# Patient Record
Sex: Female | Born: 1980 | Race: Black or African American | Hispanic: No | Marital: Married | Smoking: Never smoker
Health system: Southern US, Community
[De-identification: ages and names within clinical notes are randomized; demographics above are authoritative.]

## PROBLEM LIST (undated history)

## (undated) ENCOUNTER — Inpatient Hospital Stay (HOSPITAL_COMMUNITY): Payer: Self-pay

## (undated) DIAGNOSIS — D47Z2 Castleman disease: Secondary | ICD-10-CM

## (undated) DIAGNOSIS — H9192 Unspecified hearing loss, left ear: Secondary | ICD-10-CM

## (undated) DIAGNOSIS — O1490 Unspecified pre-eclampsia, unspecified trimester: Secondary | ICD-10-CM

## (undated) DIAGNOSIS — D499 Neoplasm of unspecified behavior of unspecified site: Secondary | ICD-10-CM

## (undated) HISTORY — DX: Unspecified hearing loss, left ear: H91.92

## (undated) HISTORY — PX: ADENOIDECTOMY: SUR15

## (undated) HISTORY — PX: LEEP: SHX91

## (undated) HISTORY — PX: OTHER SURGICAL HISTORY: SHX169

## (undated) HISTORY — DX: Unspecified pre-eclampsia, unspecified trimester: O14.90

## (undated) HISTORY — DX: Castleman disease: D47.Z2

---

## 2000-12-31 ENCOUNTER — Emergency Department (HOSPITAL_COMMUNITY): Admission: EM | Admit: 2000-12-31 | Discharge: 2000-12-31 | Payer: Self-pay | Admitting: Emergency Medicine

## 2004-05-17 ENCOUNTER — Ambulatory Visit (HOSPITAL_COMMUNITY): Admission: RE | Admit: 2004-05-17 | Discharge: 2004-05-17 | Payer: Self-pay | Admitting: Obstetrics and Gynecology

## 2005-08-13 ENCOUNTER — Ambulatory Visit (HOSPITAL_COMMUNITY): Admission: RE | Admit: 2005-08-13 | Discharge: 2005-08-13 | Payer: Self-pay | Admitting: Obstetrics

## 2005-10-05 ENCOUNTER — Ambulatory Visit (HOSPITAL_COMMUNITY): Admission: RE | Admit: 2005-10-05 | Discharge: 2005-10-05 | Payer: Self-pay | Admitting: Obstetrics

## 2005-11-19 ENCOUNTER — Inpatient Hospital Stay (HOSPITAL_COMMUNITY): Admission: AD | Admit: 2005-11-19 | Discharge: 2005-11-22 | Payer: Self-pay | Admitting: Obstetrics

## 2005-11-28 ENCOUNTER — Inpatient Hospital Stay (HOSPITAL_COMMUNITY): Admission: AD | Admit: 2005-11-28 | Discharge: 2005-11-30 | Payer: Self-pay | Admitting: Obstetrics & Gynecology

## 2009-06-16 ENCOUNTER — Ambulatory Visit: Payer: Self-pay | Admitting: Emergency Medicine

## 2009-06-16 ENCOUNTER — Ambulatory Visit: Payer: Self-pay | Admitting: Thoracic Surgery

## 2009-06-16 DIAGNOSIS — R930 Abnormal findings on diagnostic imaging of skull and head, not elsewhere classified: Secondary | ICD-10-CM | POA: Insufficient documentation

## 2009-06-21 ENCOUNTER — Ambulatory Visit (HOSPITAL_COMMUNITY): Admission: RE | Admit: 2009-06-21 | Discharge: 2009-06-21 | Payer: Self-pay | Admitting: Thoracic Surgery

## 2009-06-21 ENCOUNTER — Ambulatory Visit: Payer: Self-pay | Admitting: Thoracic Surgery

## 2009-06-21 ENCOUNTER — Ambulatory Visit: Payer: Self-pay | Admitting: Emergency Medicine

## 2009-06-22 ENCOUNTER — Ambulatory Visit: Payer: Self-pay | Admitting: Internal Medicine

## 2009-06-24 ENCOUNTER — Ambulatory Visit: Payer: Self-pay | Admitting: Thoracic Surgery

## 2009-07-11 LAB — LACTATE DEHYDROGENASE: LDH: 100 U/L (ref 94–250)

## 2009-07-11 LAB — CBC WITH DIFFERENTIAL/PLATELET
BASO%: 0.4 % (ref 0.0–2.0)
EOS%: 1.2 % (ref 0.0–7.0)
HGB: 13.8 g/dL (ref 11.6–15.9)
MCH: 29.8 pg (ref 25.1–34.0)
MCHC: 32.8 g/dL (ref 31.5–36.0)
MCV: 90.8 fL (ref 79.5–101.0)
MONO%: 5.5 % (ref 0.0–14.0)
RBC: 4.62 10*6/uL (ref 3.70–5.45)
RDW: 13.5 % (ref 11.2–14.5)
lymph#: 2.1 10*3/uL (ref 0.9–3.3)

## 2009-07-11 LAB — SEDIMENTATION RATE: Sed Rate: 7 mm/hr (ref 0–22)

## 2009-07-11 LAB — HIV ANTIBODY (ROUTINE TESTING W REFLEX)

## 2009-07-15 ENCOUNTER — Ambulatory Visit: Payer: Self-pay | Admitting: Thoracic Surgery

## 2009-08-05 ENCOUNTER — Ambulatory Visit: Payer: Self-pay | Admitting: Oncology

## 2009-08-12 ENCOUNTER — Ambulatory Visit (HOSPITAL_COMMUNITY): Admission: RE | Admit: 2009-08-12 | Discharge: 2009-08-12 | Payer: Self-pay | Admitting: Oncology

## 2009-08-31 ENCOUNTER — Ambulatory Visit: Payer: Self-pay | Admitting: Thoracic Surgery

## 2009-09-05 ENCOUNTER — Ambulatory Visit: Payer: Self-pay | Admitting: Oncology

## 2009-10-03 ENCOUNTER — Encounter: Payer: Self-pay | Admitting: Emergency Medicine

## 2009-11-03 ENCOUNTER — Ambulatory Visit: Payer: Self-pay | Admitting: Oncology

## 2009-11-07 ENCOUNTER — Ambulatory Visit (HOSPITAL_COMMUNITY): Admission: RE | Admit: 2009-11-07 | Discharge: 2009-11-07 | Payer: Self-pay | Admitting: Oncology

## 2009-11-07 LAB — CBC WITH DIFFERENTIAL/PLATELET
BASO%: 0.5 % (ref 0.0–2.0)
Basophils Absolute: 0 10*3/uL (ref 0.0–0.1)
HCT: 41.4 % (ref 34.8–46.6)
HGB: 13.9 g/dL (ref 11.6–15.9)
LYMPH%: 35.2 % (ref 14.0–49.7)
MCH: 30.2 pg (ref 25.1–34.0)
MCHC: 33.6 g/dL (ref 31.5–36.0)
MONO#: 0.3 10*3/uL (ref 0.1–0.9)
NEUT%: 57.4 % (ref 38.4–76.8)
Platelets: 288 10*3/uL (ref 145–400)
WBC: 6.1 10*3/uL (ref 3.9–10.3)

## 2009-11-07 LAB — BASIC METABOLIC PANEL
BUN: 16 mg/dL (ref 6–23)
CO2: 23 mEq/L (ref 19–32)
Calcium: 8.9 mg/dL (ref 8.4–10.5)
Creatinine, Ser: 0.93 mg/dL (ref 0.40–1.20)
Glucose, Bld: 85 mg/dL (ref 70–99)

## 2010-02-21 ENCOUNTER — Ambulatory Visit: Payer: Self-pay | Admitting: Oncology

## 2010-02-23 ENCOUNTER — Encounter: Payer: Self-pay | Admitting: Emergency Medicine

## 2010-05-16 NOTE — Assessment & Plan Note (Signed)
Summary: abnormal CT scan chest    Visit Type:  Initial Consult  CC:  abnormal CT scan.  History of Present Illness: 30 yo woman, little PMH, presented to [facility] with chest discomfort and tightness. CXR revealed R paratracheal mass. CT scan was performed that confirmed a 3.5 x 2.5 x 5.0cm R paratracheal without any other notable LAD. Presents to discuss tissue dx.   Current Medications (verified): 1)  Ortho Tri-Cyclen (28) 0.18/0.215/0.25 Mg-35 Mcg Tabs (Norgestim-Eth Estrad Triphasic)  Allergies (verified): 1)  ! Penicillin   Past History:  Past Medical History: none  Family History: non-contributory  Social History: Never Smoker Single with 1 child No EtOH  Review of Systems       The patient complains of shortness of breath with activity and chest pain.  The patient denies shortness of breath at rest, productive cough, non-productive cough, coughing up blood, irregular heartbeats, acid heartburn, indigestion, loss of appetite, weight change, abdominal pain, difficulty swallowing, sore throat, tooth/dental problems, headaches, nasal congestion/difficulty breathing through nose, sneezing, itching, ear ache, anxiety, depression, hand/feet swelling, joint stiffness or pain, rash, change in color of mucus, and fever.    Vital Signs:  Patient profile:   30 year old female Height:      59.5 inches Weight:      155 pounds O2 Sat:      98 % on Room air Pulse rate:   86 / minute Resp:     18 per minute BP sitting:   121 / 81  Vitals Entered By: Leslye Peer MD (June 16, 2009 3:57 PM)  O2 Flow:  Room air  Physical Exam  General:  well developed, well nourished, in no acute distress Head:  normocephalic and atraumatic Eyes:  conjunctiva and sclera clear Nose:  no deformity, discharge, inflammation, or lesions Mouth:  no deformity or lesions Neck:  no masses, thyromegaly, or abnormal cervical nodes Chest Wall:  no deformities noted Lungs:  clear bilaterally to  auscultation and percussion Heart:  regular rate and rhythm, S1, S2 without murmurs, rubs, gallops, or clicks Abdomen:  not examined Msk:  no deformity or scoliosis noted with normal posture Extremities:  no clubbing, cyanosis, edema, or deformity noted Neurologic:  non-focal Skin:  intact without lesions or rashes Psych:  alert and cooperative; normal mood and affect; normal attention span and concentration   Impression & Recommendations:  Problem # 1:  COMPUTERIZED TOMOGRAPHY, CHEST, ABNORMAL (ICD-793.1)  Paratracheal mass, worrisome for lymphoma. Will plan for EBUS bx next week with Dr Edwyna Shell.   Orders: Consultation Level IV (54098)  Medications Added to Medication List This Visit: 1)  Ortho Tri-cyclen (28) 0.18/0.215/0.25 Mg-35 Mcg Tabs (Norgestim-eth estrad triphasic)

## 2010-05-16 NOTE — Letter (Signed)
[facility]   Imported By: Lennie Odor 10/27/2009 12:18:50  _____________________________________________________________________  External Attachment:    Type:   Image     Comment:   External Document

## 2010-05-16 NOTE — Letter (Signed)
Summary: [facility]  [facility]   Imported By: Lennie Odor 03/10/2010 14:34:48  _____________________________________________________________________  External Attachment:    Type:   Image     Comment:   External Document

## 2010-05-26 ENCOUNTER — Encounter: Payer: Self-pay | Admitting: Emergency Medicine

## 2010-05-26 ENCOUNTER — Other Ambulatory Visit: Payer: Self-pay | Admitting: Oncology

## 2010-05-26 ENCOUNTER — Encounter (HOSPITAL_BASED_OUTPATIENT_CLINIC_OR_DEPARTMENT_OTHER): Payer: Managed Care, Other (non HMO) | Admitting: Oncology

## 2010-05-26 ENCOUNTER — Ambulatory Visit (HOSPITAL_COMMUNITY)
Admission: RE | Admit: 2010-05-26 | Discharge: 2010-05-26 | Disposition: A | Discharge status: Home / Self Care | Payer: Managed Care, Other (non HMO) | Source: Ambulatory Visit | Attending: Oncology | Admitting: Oncology

## 2010-05-26 DIAGNOSIS — R599 Enlarged lymph nodes, unspecified: Secondary | ICD-10-CM | POA: Insufficient documentation

## 2010-05-26 DIAGNOSIS — D47Z2 Castleman disease: Secondary | ICD-10-CM

## 2010-06-27 NOTE — Letter (Signed)
Summary: [facility]  [facility]   Imported By: Sherian Rein 06/20/2010 08:55:43  _____________________________________________________________________  External Attachment:    Type:   Image     Comment:   External Document

## 2010-07-10 LAB — FUNGUS CULTURE W SMEAR: Fungal Smear: NONE SEEN

## 2010-07-10 LAB — APTT: aPTT: 27 seconds (ref 24–37)

## 2010-07-10 LAB — COMPREHENSIVE METABOLIC PANEL
ALT: 20 U/L (ref 0–35)
Albumin: 3.9 g/dL (ref 3.5–5.2)
Alkaline Phosphatase: 58 U/L (ref 39–117)
Calcium: 9.5 mg/dL (ref 8.4–10.5)
Glucose, Bld: 91 mg/dL (ref 70–99)
Potassium: 3.9 mEq/L (ref 3.5–5.1)
Sodium: 137 mEq/L (ref 135–145)
Total Protein: 6.8 g/dL (ref 6.0–8.3)

## 2010-07-10 LAB — PROTIME-INR: Prothrombin Time: 13.6 seconds (ref 11.6–15.2)

## 2010-07-10 LAB — TYPE AND SCREEN
ABO/RH(D): B POS
Antibody Screen: NEGATIVE

## 2010-07-10 LAB — CBC
MCHC: 33.3 g/dL (ref 30.0–36.0)
Platelets: 343 10*3/uL (ref 150–400)
RDW: 13.2 % (ref 11.5–15.5)

## 2010-07-10 LAB — ABO/RH: ABO/RH(D): B POS

## 2010-07-10 LAB — CULTURE, RESPIRATORY W GRAM STAIN

## 2010-07-10 LAB — AFB CULTURE WITH SMEAR (NOT AT ARMC)

## 2010-07-10 LAB — HCG, SERUM, QUALITATIVE: Preg, Serum: NEGATIVE

## 2010-08-29 NOTE — Letter (Signed)
June 24, 2009   Bertram Millard. Hyacinth Meeker, MD  [address] [place], [place] [address]   Re:  Tracy Downs, Tracy Downs                 DOB:  December 29, 1980   Dear Dr. Hyacinth Meeker:   I saw the patient back today after we did her biopsies and she has a  rare disease called Castleman disease.  This could be a pre-lymphoma  disease, so I am referring her to Dr. Mancel Bale for evaluation.  Most of these in young people are benign; but given the size of her  lymph node, there is some question that this may need to be removed  surgically; but right now since she is asymptomatic, we will continue to  follow her and reevaluate her after Dr. Kalman Drape evaluation.   Ines Bloomer, M.D.  Electronically Signed   DPB/MEDQ  D:  06/24/2009  T:  06/25/2009  Job:  [id number]   cc:   Leighton Roach. Truett Perna, M.D.

## 2010-08-29 NOTE — Letter (Signed)
June 16, 2009   Bertram Millard. Hyacinth Meeker, MD  [address] [place], [place] [address]   Re:  Tracy Downs, Tracy Downs                 DOB:  Apr 17, 1980   Dear [name],   I appreciate the opportunity of seeing the patient.  This is a 30-year-  old African American female who was having some tightness of breath and  chest pain and went to Urgent Care where a chest x-ray revealed a right  paratracheal mass.  She then had a CT scan done at [facility], which  showed a right paratracheal mass that was 5.5 x 2.5 x 3.5.  There also  were some nonenhancing soft tissue mass cuddled to the cephalic vein. A  PET scan was tried to be obtained, but he was having some problems with  the insurance.  She has had no weight loss.  No night sweats.  No fever.  No chills.  No hemoptysis.  No excessive sputum.   PAST MEDICAL HISTORY:  She is on birth control pills.   ALLERGIES:  She is allergic to penicillin and amoxicillin.   SOCIAL HISTORY:  She is single, has 1 child.  Does not smoke.  Does not  drink alcohol on a regular basis.   REVIEW OF SYSTEMS:  CONSTITUTION:  She is 155 pounds.  She is 4 feet 11.  GENERAL:  Her weight has been stable.  CARDIAC:  She has some tightness of breath and some palpitations.  PULMONARY:  No hemoptysis.  GI:  No nausea, vomiting, constipation, diarrhea.  No GERD.  GU:  No kidney disease, dysuria or frequent urination.  VASCULAR:  No claudication, DVT, TIA's.  NEUROLOGIC:  She has intermittent headaches.  MUSCULOSKELETAL:  Arthritis.  PSYCHIATRIC:  No depression or nervous.  EYE/ENT:  No change in her eyesight or hearing.  HEMATOLOGIC:  No problems with bleeding, clotting disorders or anemia.   PHYSICAL EXAMINATION:  General:  She is a well-developed [place]  American female in no acute distress.  Vital Signs:  Her blood pressure  121/81, pulse 86, respirations 18, sats were 98%.  Head, Eyes, Ears,  Nose and Throat:  Unremarkable.  Neck:  She has a thyromegaly.  No  adenopathy.   Lungs:  Clear to auscultation and percussion.  Heart:  Regular sinus rhythm.  No murmurs.  Abdomen:  Soft.  No splenomegaly.  Extremities:  Pulses are 2+.  There is no clubbing or edema.  Neurologic:  She is oriented x3.  Sensory and motor intact.  Cranial  nerves intact.   IMPRESSION:  Right paratracheal mass.   Differential diagnoses would be lymphoma versus cancer versus  sarcoidosis just have an isolated mass would go more toward a lymphoma.  She has no constitutional symptoms, but I still feel that a bronchoscopy  with endobronchial ultrasound and positive mediastinoscopy is needed.  We opt to get a PET scan prior to her procedure and plan to do this on  March 8 with Dr. Levy Pupa who will inform me the findings.    Sincerely,   Ines Bloomer, M.D.  Electronically Signed   DPB/MEDQ  D:  06/16/2009  T:  06/17/2009  Job:  [id number]

## 2010-08-29 NOTE — Letter (Signed)
July 15, 2009   Leighton Roach. Truett Perna, MD  [address] [facility]  [place] [address]   Re:  Downs, Tracy                 DOB:  11-Oct-1980   Dear Nida Boatman:   I saw that you saw the patient regarding her right paratracheal  Castleman disease.  She said that you referred her to [facility] for another  opinion.  Her mediastinoscopy incision was well healed today, and I did  discuss with her about the options and I thought that resection of this  would probably be the procedure of choice in the future, but we will  wait if she gets a consultation done at [facility] or [facility].  I will see  her back again in 4 weeks.  Her blood pressure was 120/82, pulse 91,  respirations 18, and sats were 97%.   Ines Bloomer, M.D.  Electronically Signed   DPB/MEDQ  D:  07/15/2009  T:  07/16/2009  Job:  [id number]   cc:   Bertram Millard. Hyacinth Meeker, M.D.

## 2010-08-29 NOTE — Letter (Signed)
Aug 31, 2009   Leighton Roach. Sherrill  [address]  [place], [place]  [address]   Re:  JAICE, LAGUE                 DOB:  01-02-1981   Dear Nida Boatman,   I saw the patient back today and apparently I looked at her CT scan,  showed some more adenopathy in her chest with a stable paratracheal  mass.  Her mediastinoscopy incision is well healed.  Her blood pressure  is 113/79, pulse 85, respirations 18, sats were 98%.  I will let you to  continue to follow her, but I will be happy to see her again if we think  we need to excise this paratracheal mass.   Ines Bloomer, M.D.  Electronically Signed   DPB/MEDQ  D:  08/31/2009  T:  09/01/2009  Job:  [id number]

## 2010-09-01 NOTE — Discharge Summary (Signed)
NAMENERINE, PULSE                 ACCOUNT NO.:  [id number]   MEDICAL RECORD NO.:  [id number]          PATIENT TYPE:  INP   LOCATION:  9308                          FACILITY:  [facility]   PHYSICIAN:  Charles A. Clearance Coots, M.D.DATE OF BIRTH:  1980/08/24   DATE OF ADMISSION:  11/28/2005  DATE OF DISCHARGE:  11/30/2005                                 DISCHARGE SUMMARY   ADMITTING DIAGNOSES:  1. Ascending urinary tract infection.  2. Possible developing pyelonephritis.  3. Possible endometritis.   DISCHARGE DIAGNOSES:  1. Ascending urinary tract infection.  2. Possible developing pyelonephritis.  3. Possible endometritis.  4. Urosepsis.   REASON FOR ADMISSION:  A 30 year old black female status post normal  spontaneous vaginal delivery approximately a week ago complicated by  preeclampsia. The preeclampsia resolved quickly, and postpartum course was  uncomplicated. The patient now presents with the onset of fever and chills,  headache, and dizziness developing over the past 24 hours. She denies  abdominal or back pain. She also denies shortness of breath.   PAST MEDICAL SURGERY:  Adenoidectomy in 1992.   ILLNESSES:  None.   MEDICATIONS:  Prenatal vitamins, ibuprofen.   ALLERGIES:  PENICILLIN, AMOXICILLIN. She develops a rash.   SOCIAL HISTORY:  Single. Negative tobacco, alcohol or recreational drug use.   PHYSICAL EXAMINATION:  GENERAL:  A well-nourished, well-developed female in  no acute distress.  VITAL SIGNS:  Temperature 103, pulse 145, respiratory rate 25, blood  pressure 93/32.  LUNGS:  Clear to auscultation bilaterally.  HEART:  Tachycardia.  ABDOMEN:  Soft, nontender.  BACK:  Negative CVA tenderness.  PELVIC:  Uterus normal postpartum size, slightly tender to deep palpation.   LABORATORY VALUES:  Urinalysis revealed a specific gravity 1.025, negative  ketones, positive nitrite, small leukocyte esterase. Blood cultures were  done. CBC with differential revealed white  blood cell count 9600, hemoglobin  13, hematocrit 38, platelets 339,000, coags were within normal limits. Urine  culture was also sent. Microscopic urinalysis revealed moderate to white  blood cells; otherwise, negative. GC and chlamydia cultures were sent.   Renal ultrasound revealed both kidneys to be within normal limits. No  evidence of hydronephrosis.   HOSPITAL COURSE:  The patient was admitted and started on IV antibiotic  therapy, ampicillin, gentamicin, and clindamycin. She responded well to  therapy. Blood cultures revealed positive gram-negative rods. Grew out E.  coli. Urine culture greater than 100,000 E. coli. Infectious disease  consultation was obtained with Dr. __________ , and recommendation was made  to continue antibiotic therapy with p.o. antibiotic of Avelox 400 mg daily  for 10 days, and the patient can be discharged home to complete that  therapy. The patient was asymptomatic, and IV antibiotics were therefore  discontinued on hospital day #2, and she was discharged home on hospital day  #2 to continue Avelox p.o. for 10 days.   DISCHARGE MEDICATIONS:  Avelox 400 mg daily p.o. for 10 days.   The patient is to call the office for follow-up appointment in two weeks.      Charles A. Clearance Coots, M.D.  Electronically Signed  CAH/MEDQ  D:  01/03/2006  T:  01/04/2006  Job:  [id number]

## 2010-11-30 ENCOUNTER — Encounter (HOSPITAL_BASED_OUTPATIENT_CLINIC_OR_DEPARTMENT_OTHER): Payer: Managed Care, Other (non HMO) | Admitting: Oncology

## 2010-11-30 ENCOUNTER — Ambulatory Visit (HOSPITAL_COMMUNITY)
Admission: RE | Admit: 2010-11-30 | Discharge: 2010-11-30 | Disposition: A | Discharge status: Home / Self Care | Payer: Managed Care, Other (non HMO) | Source: Ambulatory Visit | Attending: Oncology | Admitting: Oncology

## 2010-11-30 ENCOUNTER — Other Ambulatory Visit: Payer: Self-pay | Admitting: Oncology

## 2010-11-30 DIAGNOSIS — D47Z2 Castleman disease: Secondary | ICD-10-CM

## 2010-11-30 DIAGNOSIS — R599 Enlarged lymph nodes, unspecified: Secondary | ICD-10-CM

## 2011-07-30 ENCOUNTER — Other Ambulatory Visit: Payer: Self-pay | Admitting: *Deleted

## 2011-07-30 DIAGNOSIS — D47Z2 Castleman disease: Secondary | ICD-10-CM

## 2011-07-31 ENCOUNTER — Ambulatory Visit (HOSPITAL_BASED_OUTPATIENT_CLINIC_OR_DEPARTMENT_OTHER): Payer: Managed Care, Other (non HMO) | Admitting: Nurse Practitioner

## 2011-07-31 ENCOUNTER — Ambulatory Visit (HOSPITAL_COMMUNITY)
Admission: RE | Admit: 2011-07-31 | Discharge: 2011-07-31 | Disposition: A | Discharge status: Home / Self Care | Payer: Managed Care, Other (non HMO) | Source: Ambulatory Visit | Attending: Oncology | Admitting: Oncology

## 2011-07-31 ENCOUNTER — Telehealth: Payer: Self-pay | Admitting: Oncology

## 2011-07-31 VITALS — BP 124/83 | HR 87 | Temp 97.5°F | Ht 59.5 in | Wt 155.7 lb

## 2011-07-31 DIAGNOSIS — R599 Enlarged lymph nodes, unspecified: Secondary | ICD-10-CM

## 2011-07-31 DIAGNOSIS — D47Z2 Castleman disease: Secondary | ICD-10-CM

## 2011-07-31 NOTE — Telephone Encounter (Signed)
appts made and printed and pt advised to get cxr prior to appts    aom

## 2011-07-31 NOTE — Progress Notes (Signed)
OFFICE PROGRESS NOTE  Interval history:  [name] returns as scheduled. She overall feels well. She occasionally notes chest pain. The pain tends to be position related. No shortness of breath or cough. No fevers or sweats. No interim illnesses or infections.   Objective: Blood pressure 124/83, pulse 87, temperature 97.5 F (36.4 C), temperature source Oral, height 4' 11.5" (1.511 m), weight 155 lb 11.2 oz (70.625 kg), last menstrual period 07/14/2011.  Oropharynx is without thrush or ulceration. No palpable cervical, supraclavicular, axillary or inguinal lymph nodes. Lungs are clear. No wheezes or rales. Regular cardiac rhythm. Abdomen is soft and nontender. No organomegaly. Extremities without edema.  Lab Results: Lab Results  Component Value Date   WBC 6.1 11/07/2009   HGB 13.9 11/07/2009   HCT 41.4 11/07/2009   MCV 89.8 11/07/2009   PLT 288 11/07/2009    Chemistry:    Chemistry      Component Value Date/Time   NA 135 11/07/2009 0815   K 4.4 11/07/2009 0815   CL 103 11/07/2009 0815   CO2 23 11/07/2009 0815   BUN 16 11/07/2009 0815   CREATININE 0.93 11/07/2009 0815      Component Value Date/Time   CALCIUM 8.9 11/07/2009 0815   ALKPHOS 58 06/20/2009 1522   AST 22 06/20/2009 1522   ALT 20 06/20/2009 1522   BILITOT 0.6 06/20/2009 1522       Studies/Results: Dg Chest 2 View  07/31/2011  *RADIOLOGY REPORT*  Clinical Data: Follow up adenopathy  CHEST - 2 VIEW  Comparison: 11/30/2010  Findings: Right paratracheal lymph node mass is again noted and appears unchanged in size from 11/30/2010.  The heart size is normal.  No pleural effusion or pulmonary edema.  No airspace consolidation identified.  IMPRESSION:  1.  No acute cardiopulmonary abnormalities. 2.  Stable appearance of right lower paratracheal lymph node mass  Original Report Authenticated By: Rosealee Albee, M.D.    Medications: I have reviewed the patient's current medications.  Assessment/Plan:  1. Castleman's disease.  A CT  of the chest 06/09/2009 confirmed a right paratracheal mass.  A mediastinoscopy 06/21/2009 with biopsy of the mediastinal mass confirmed Castleman's disease.  Staging CTs of the chest, abdomen and pelvis 08/12/2009 confirmed a right paratracheal mass, a superior mediastinal lymph node and a left cervical/supraclavicular lymph node.  There was no evidence of Castleman's disease in the abdomen or pelvis.  A CT on 11/07/2009 was stable.  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 2. G1 P1. 3. Intermittent mild chest discomfort, typically position related. Potentially related to the mediastinal mass.  Disposition-Tracy Downs appears stable. Chest x-ray done earlier today was stable. We will continue to follow on observation approach. She will return for a chest x-ray and office visit in 8 months. She will contact the office in the interim with any problems. We specifically discussed worsening pain, persistent cough, shortness of breath.  Plan reviewed with Dr. Truett Perna. Dr. Truett Perna reviewed the chest x-ray on the computer with [name] and her mother.  Lonna Cobb ANP/GNP-BC

## 2011-08-02 ENCOUNTER — Ambulatory Visit: Payer: Managed Care, Other (non HMO) | Admitting: Oncology

## 2012-04-01 ENCOUNTER — Ambulatory Visit (HOSPITAL_COMMUNITY)
Admission: RE | Admit: 2012-04-01 | Discharge: 2012-04-01 | Disposition: A | Discharge status: Home / Self Care | Payer: Managed Care, Other (non HMO) | Source: Ambulatory Visit | Attending: Nurse Practitioner | Admitting: Nurse Practitioner

## 2012-04-01 ENCOUNTER — Telehealth: Payer: Self-pay | Admitting: Oncology

## 2012-04-01 ENCOUNTER — Ambulatory Visit (HOSPITAL_BASED_OUTPATIENT_CLINIC_OR_DEPARTMENT_OTHER): Payer: Managed Care, Other (non HMO) | Admitting: Oncology

## 2012-04-01 DIAGNOSIS — R599 Enlarged lymph nodes, unspecified: Secondary | ICD-10-CM | POA: Insufficient documentation

## 2012-04-01 DIAGNOSIS — C8589 Other specified types of non-Hodgkin lymphoma, extranodal and solid organ sites: Secondary | ICD-10-CM

## 2012-04-01 DIAGNOSIS — D47Z2 Castleman disease: Secondary | ICD-10-CM

## 2012-04-01 NOTE — Progress Notes (Signed)
   [facility]    OFFICE PROGRESS NOTE   INTERVAL HISTORY:   She returns as scheduled. She feels well. No significant cough or dyspnea. Good appetite and energy level. No fever or night sweats. She plans on becoming pregnant within the next few years.  Objective:  Vital signs in last 24 hours:  Last menstrual period 03/18/2012.    HEENT: Symmetric fullness in the low neck near the thyroid without a discrete mass Lymphatics: No cervical, supraclavicular, or inguinal nodes. "Shotty "less than 1/2 cm left axillary node. Resp: Lungs clear bilaterally, no respiratory distress Cardio: Regular rate and rhythm GI: No hepatosplenomegaly Vascular: No leg edema  X-rays: Chest x-ray 04/01/2012-the right paratracheal nodal mass is unchanged. No new adenopathy.   Medications: I have reviewed the patient's current medications.  Assessment/Plan: 1. Castleman's disease. A CT of the chest 06/09/2009 confirmed a right paratracheal mass. A mediastinoscopy 06/21/2009 with biopsy of the mediastinal mass confirmed Castleman's disease. Staging CTs of the chest, abdomen and pelvis 08/12/2009 confirmed a right paratracheal mass, a superior mediastinal lymph node and a left cervical/supraclavicular lymph node. There was no evidence of Castleman's disease in the abdomen or pelvis. A CT on 11/07/2009 was stable. 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 2. G1 P1. 3. Intermittent mild chest discomfort, typically position related. Potentially related to the mediastinal mass.  Disposition:  She appears stable. The chest x-ray is unchanged. She will return for an office visit and chest x-ray in 8 months. Ms. Alessandra Bevels will contact us in the interim for new symptoms.   Thornton Papas, MD  04/01/2012  9:41 AM

## 2012-04-01 NOTE — Telephone Encounter (Signed)
appts made and printed for pt ,pt aware to get cxr prior to visit        Tracy Downs

## 2012-07-28 ENCOUNTER — Telehealth: Payer: Self-pay | Admitting: Oncology

## 2012-07-28 NOTE — Telephone Encounter (Signed)
Moved 8/19 appt to 8/26 due LT CME. S/w pt she is aware of new d/t and also of her cxr order. Per pt she will do cxr same day as visit approx 8am.

## 2012-09-09 ENCOUNTER — Other Ambulatory Visit (INDEPENDENT_AMBULATORY_CARE_PROVIDER_SITE_OTHER): Payer: Managed Care, Other (non HMO)

## 2012-09-09 VITALS — BP 129/85 | HR 114 | Temp 98.2°F | Wt 155.4 lb

## 2012-09-09 DIAGNOSIS — Z3482 Encounter for supervision of other normal pregnancy, second trimester: Secondary | ICD-10-CM

## 2012-09-09 DIAGNOSIS — Z3201 Encounter for pregnancy test, result positive: Secondary | ICD-10-CM

## 2012-09-09 LAB — POCT URINE PREGNANCY: Preg Test, Ur: POSITIVE

## 2012-09-09 NOTE — Progress Notes (Signed)
Patient is here today for a urine UPT.  Patient states that she had a positive home pregnancy test.  Patient was given prenatal vitamins and a NOB appointment.

## 2012-10-08 ENCOUNTER — Encounter: Payer: Self-pay | Admitting: Obstetrics & Gynecology

## 2012-10-08 ENCOUNTER — Ambulatory Visit (INDEPENDENT_AMBULATORY_CARE_PROVIDER_SITE_OTHER): Payer: Managed Care, Other (non HMO) | Admitting: Obstetrics & Gynecology

## 2012-10-08 VITALS — BP 124/83 | Temp 98.8°F | Wt 158.6 lb

## 2012-10-08 DIAGNOSIS — Z3481 Encounter for supervision of other normal pregnancy, first trimester: Secondary | ICD-10-CM

## 2012-10-08 DIAGNOSIS — Z3201 Encounter for pregnancy test, result positive: Secondary | ICD-10-CM

## 2012-10-08 LAB — POCT URINALYSIS DIPSTICK
Bilirubin, UA: NEGATIVE
Blood, UA: NEGATIVE
Glucose, UA: NEGATIVE
Ketones, UA: NEGATIVE
Nitrite, UA: NEGATIVE
Spec Grav, UA: 1.02
Urobilinogen, UA: NEGATIVE
pH, UA: 6

## 2012-10-08 LAB — HIV ANTIBODY (ROUTINE TESTING W REFLEX): HIV: NONREACTIVE

## 2012-10-08 NOTE — Progress Notes (Signed)
Pulse- 91 . Subjective:    Tracy Downs is being seen today for her first obstetrical visit.  This is a planned pregnancy. She is at Unknown gestation. Her obstetrical history is significant for pre-eclampsia. Relationship with FOB: spouse, living together. Patient does not intend to breast feed. Pregnancy history fully reviewed.  Menstrual History: OB History   Grav Para Term Preterm Abortions TAB SAB Ect Mult Living   2 1 1       1       Menarche age: 17 Patient's last menstrual period was 08/05/2012.    The following portions of the patient's history were reviewed and updated as appropriate: allergies, current medications, past family history, past medical history, past social history, past surgical history and problem list.  Review of Systems Pertinent items are noted in HPI.    Objective:   General Appearance:    Alert, cooperative, no distress, appears stated age  Head:    Normocephalic, without obvious abnormality, atraumatic  Eyes:    PERRL, conjunctiva/corneas clear, EOM's intact, fundi    benign, both eyes  Ears:    Normal TM's and external ear canals, both ears  Nose:   Nares normal, septum midline, mucosa normal, no drainage    or sinus tenderness  Throat:   Lips, mucosa, and tongue normal; teeth and gums normal  Neck:   Supple, symmetrical, trachea midline, no adenopathy;    thyroid:  no enlargement/tenderness/nodules; no carotid   bruit or JVD  Back:     Symmetric, no curvature, ROM normal, no CVA tenderness  Lungs:     Clear to auscultation bilaterally, respirations unlabored  Chest Wall:    No tenderness or deformity   Heart:    Regular rate and rhythm, S1 and S2 normal, no murmur, rub   or gallop  Breast Exam:    No tenderness, masses, or nipple abnormality  Abdomen:     Soft, non-tender, bowel sounds active all four quadrants,    no masses, no organomegaly  Genitalia:    Normal female without lesion, discharge or tenderness  Extremities:   Extremities  normal, atraumatic, no cyanosis or edema  Pulses:   2+ and symmetric all extremities  Skin:   Skin color, texture, turgor normal, no rashes or lesions  Lymph nodes:   Cervical, supraclavicular, and axillary nodes normal  Neurologic:   CNII-XII intact, normal strength, sensation and reflexes    Throughout  Informal U/S w/CRL @ [redacted]w[redacted]d w/cardiac activity    Assessment:    Pregnancy at 9+ weeks  Plan:    Initial labs drawn. Prenatal vitamins. Problem list reviewed and updated.  Role of ultrasound in pregnancy discussed Amniocentesis discussed: not indicated. Follow up in 6 weeks. 50% of 20 min visit spent on counseling and coordination of care.

## 2012-10-08 NOTE — Patient Instructions (Addendum)
Ovarian Cyst  The ovaries are small organs that are on each side of the uterus. The ovaries are the organs that produce the female hormones, estrogen and progesterone. An ovarian cyst is a sac filled with fluid that can vary in its size. It is normal for a small cyst to form in women who are in the childbearing age and who have menstrual periods. This type of cyst is called a follicle cyst that becomes an ovulation cyst (corpus luteum cyst) after it produces the women's egg. It later goes away on its own if the woman does not become pregnant. There are other kinds of ovarian cysts that may cause problems and may need to be treated. The most serious problem is a cyst with cancer. It should be noted that menopausal women who have an ovarian cyst are at a higher risk of it being a cancer cyst. They should be evaluated very quickly, thoroughly and followed closely. This is especially true in menopausal women because of the high rate of ovarian cancer in women in menopause.  CAUSES AND TYPES OF OVARIAN CYSTS:   FUNCTIONAL CYST: The follicle/corpus luteum cyst is a functional cyst that occurs every month during ovulation with the menstrual cycle. They go away with the next menstrual cycle if the woman does not get pregnant. Usually, there are no symptoms with a functional cyst.   ENDOMETRIOMA CYST: This cyst develops from the lining of the uterus tissue. This cyst gets in or on the ovary. It grows every month from the bleeding during the menstrual period. It is also called a "chocolate cyst" because it becomes filled with blood that turns brown. This cyst can cause pain in the lower abdomen during intercourse and with your menstrual period.   CYSTADENOMA CYST: This cyst develops from the cells on the outside of the ovary. They usually are not cancerous. They can get very big and cause lower abdomen pain and pain with intercourse. This type of cyst can twist on itself, cut off its blood supply and cause severe pain. It  also can easily rupture and cause a lot of pain.   DERMOID CYST: This type of cyst is sometimes found in both ovaries. They are found to have different kinds of body tissue in the cyst. The tissue includes skin, teeth, hair, and/or cartilage. They usually do not have symptoms unless they get very big. Dermoid cysts are rarely cancerous.   POLYCYSTIC OVARY: This is a rare condition with hormone problems that produces many small cysts on both ovaries. The cysts are follicle-like cysts that never produce an egg and become a corpus luteum. It can cause an increase in body weight, infertility, acne, increase in body and facial hair and lack of menstrual periods or rare menstrual periods. Many women with this problem develop type 2 diabetes. The exact cause of this problem is unknown. A polycystic ovary is rarely cancerous.   THECA LUTEIN CYST: Occurs when too much hormone (human chorionic gonadotropin) is produced and over-stimulates the ovaries to produce an egg. They are frequently seen when doctors stimulate the ovaries for invitro-fertilization (test tube babies).   LUTEOMA CYST: This cyst is seen during pregnancy. Rarely it can cause an obstruction to the birth canal during labor and delivery. They usually go away after delivery.  SYMPTOMS    Pelvic pain or pressure.   Pain during sexual intercourse.   Increasing girth (swelling) of the abdomen.   Abnormal menstrual periods.   Increasing pain with menstrual periods.     You stop having menstrual periods and you are not pregnant.  DIAGNOSIS   The diagnosis can be made during:   Routine or annual pelvic examination (common).   Ultrasound.   X-ray of the pelvis.   CT Scan.   MRI.   Blood tests.  TREATMENT    Treatment may only be to follow the cyst monthly for 2 to 3 months with your caregiver. Many go away on their own, especially functional cysts.   May be aspirated (drained) with a long needle with ultrasound, or by laparoscopy (inserting a tube into  the pelvis through a small incision).   The whole cyst can be removed by laparoscopy.   Sometimes the cyst may need to be removed through an incision in the lower abdomen.   Hormone treatment is sometimes used to help dissolve certain cysts.   Birth control pills are sometimes used to help dissolve certain cysts.  HOME CARE INSTRUCTIONS   Follow your caregiver's advice regarding:   Medicine.   Follow up visits to evaluate and treat the cyst.   You may need to come back or make an appointment with another caregiver, to find the exact cause of your cyst, if your caregiver is not a gynecologist.   Get your yearly and recommended pelvic examinations and Pap tests.   Let your caregiver know if you have had an ovarian cyst in the past.  SEEK MEDICAL CARE IF:    Your periods are late, irregular, they stop, or are painful.   Your stomach (abdomen) or pelvic pain does not go away.   Your stomach becomes larger or swollen.   You have pressure on your bladder or trouble emptying your bladder completely.   You have painful sexual intercourse.   You have feelings of fullness, pressure, or discomfort in your stomach.   You lose weight for no apparent reason.   You feel generally ill.   You become constipated.   You lose your appetite.   You develop acne.   You have an increase in body and facial hair.   You are gaining weight, without changing your exercise and eating habits.   You think you are pregnant.  SEEK IMMEDIATE MEDICAL CARE IF:    You have increasing abdominal pain.   You feel sick to your stomach (nausea) and/or vomit.   You develop a fever that comes on suddenly.   You develop abdominal pain during a bowel movement.   Your menstrual periods become heavier than usual.  Document Released: 04/02/2005 Document Revised: 06/25/2011 Document Reviewed: 02/03/2009  [employer] Patient Information 2014 [employer].

## 2012-10-09 LAB — OBSTETRIC PANEL
Basophils Absolute: 0 10*3/uL (ref 0.0–0.1)
Eosinophils Absolute: 0.1 10*3/uL (ref 0.0–0.7)
Eosinophils Relative: 1 % (ref 0–5)
Lymphocytes Relative: 16 % (ref 12–46)
MCV: 88.4 fL (ref 78.0–100.0)
Platelets: 358 10*3/uL (ref 150–400)
RDW: 14.5 % (ref 11.5–15.5)
Rubella: 6.93 Index — ABNORMAL HIGH (ref <0.90)
WBC: 11.6 10*3/uL — ABNORMAL HIGH (ref 4.0–10.5)

## 2012-10-09 LAB — GC/CHLAMYDIA PROBE AMP
CT Probe RNA: NEGATIVE
GC Probe RNA: NEGATIVE

## 2012-10-11 ENCOUNTER — Encounter: Payer: Self-pay | Admitting: Obstetrics & Gynecology

## 2012-10-11 DIAGNOSIS — O9989 Other specified diseases and conditions complicating pregnancy, childbirth and the puerperium: Secondary | ICD-10-CM

## 2012-10-11 DIAGNOSIS — Z22330 Carrier of Group B streptococcus: Secondary | ICD-10-CM | POA: Insufficient documentation

## 2012-10-11 DIAGNOSIS — R8271 Bacteriuria: Secondary | ICD-10-CM | POA: Insufficient documentation

## 2012-10-13 LAB — HEMOGLOBINOPATHY EVALUATION
Hgb A2 Quant: 3.1 % (ref 2.2–3.2)
Hgb A: 96.5 % — ABNORMAL LOW (ref 96.8–97.8)
Hgb F Quant: 0.4 % (ref 0.0–2.0)
Hgb S Quant: 0 %

## 2012-10-21 ENCOUNTER — Other Ambulatory Visit: Payer: Self-pay | Admitting: *Deleted

## 2012-10-21 ENCOUNTER — Encounter: Payer: Self-pay | Admitting: Obstetrics & Gynecology

## 2012-10-21 NOTE — Progress Notes (Signed)
error 

## 2012-10-27 ENCOUNTER — Other Ambulatory Visit: Payer: Self-pay | Admitting: Obstetrics & Gynecology

## 2012-10-27 DIAGNOSIS — O3680X0 Pregnancy with inconclusive fetal viability, not applicable or unspecified: Secondary | ICD-10-CM

## 2012-10-28 ENCOUNTER — Other Ambulatory Visit: Payer: Managed Care, Other (non HMO)

## 2012-10-28 ENCOUNTER — Other Ambulatory Visit: Payer: Self-pay | Admitting: *Deleted

## 2012-10-28 MED ORDER — SULFAMETHOXAZOLE-TMP DS 800-160 MG PO TABS: 1.0000 | ORAL_TABLET | Freq: Two times a day (BID) | ORAL | Status: DC

## 2012-10-29 ENCOUNTER — Encounter: Payer: Self-pay | Admitting: Obstetrics & Gynecology

## 2012-10-29 ENCOUNTER — Ambulatory Visit (HOSPITAL_COMMUNITY)
Admission: RE | Admit: 2012-10-29 | Discharge: 2012-10-29 | Disposition: A | Discharge status: Home / Self Care | Payer: Managed Care, Other (non HMO) | Source: Ambulatory Visit | Attending: Obstetrics & Gynecology | Admitting: Obstetrics & Gynecology

## 2012-10-29 DIAGNOSIS — O09299 Supervision of pregnancy with other poor reproductive or obstetric history, unspecified trimester: Secondary | ICD-10-CM | POA: Insufficient documentation

## 2012-10-29 DIAGNOSIS — Z3689 Encounter for other specified antenatal screening: Secondary | ICD-10-CM | POA: Insufficient documentation

## 2012-10-29 DIAGNOSIS — O3680X0 Pregnancy with inconclusive fetal viability, not applicable or unspecified: Secondary | ICD-10-CM

## 2012-10-29 LAB — US OB COMP LESS 14 WKS

## 2012-11-05 ENCOUNTER — Ambulatory Visit (INDEPENDENT_AMBULATORY_CARE_PROVIDER_SITE_OTHER): Payer: Managed Care, Other (non HMO) | Admitting: Obstetrics & Gynecology

## 2012-11-05 ENCOUNTER — Encounter: Payer: Self-pay | Admitting: Obstetrics & Gynecology

## 2012-11-05 VITALS — BP 126/81 | Temp 98.7°F | Wt 160.0 lb

## 2012-11-05 DIAGNOSIS — D47Z2 Castleman disease: Secondary | ICD-10-CM

## 2012-11-05 DIAGNOSIS — Z3480 Encounter for supervision of other normal pregnancy, unspecified trimester: Secondary | ICD-10-CM

## 2012-11-05 DIAGNOSIS — R599 Enlarged lymph nodes, unspecified: Secondary | ICD-10-CM

## 2012-11-05 DIAGNOSIS — Z3482 Encounter for supervision of other normal pregnancy, second trimester: Secondary | ICD-10-CM

## 2012-11-05 LAB — POCT URINALYSIS DIPSTICK
Blood, UA: NEGATIVE
Leukocytes, UA: NEGATIVE
Nitrite, UA: NEGATIVE
Protein, UA: NEGATIVE
pH, UA: 5

## 2012-11-05 NOTE — Progress Notes (Signed)
P- 108 Pt states she is having pain in lower abdomen.

## 2012-11-05 NOTE — Progress Notes (Signed)
Referral-->MFM.

## 2012-11-05 NOTE — Patient Instructions (Signed)
Pregnancy - Second Trimester The second trimester is the period between 13 to 27 weeks of your pregnancy. It is important to follow your doctor's instructions. HOME CARE   Do not smoke.  Do not drink alcohol or use drugs.  Only take medicine as told by your doctor.  Take prenatal vitamins as told. The vitamin should contain 1 milligram of folic acid.  Exercise.  Eat healthy foods. Eat regular, well-balanced meals.  You can have sex (intercourse) if there are no other problems with the pregnancy.  Do not use hot tubs, steam rooms, or saunas.  Wear a seat belt while driving.  Avoid raw meat, uncooked cheese, and litter boxes and soil used by cats.  Visit your dentist. Cleanings are okay. GET HELP RIGHT AWAY IF:   You have a temperature by mouth above 102 F (38.9 C), not controlled by medicine.  Fluid is coming from your vagina.  Blood is coming from your vagina. Light spotting is common, especially after sex (intercourse).  You have a bad smelling fluid (discharge) coming from the vagina. The fluid changes from clear to white.  You still feel sick to your stomach (nauseous).  You throw up (vomit) blood.  You lose or gain more than 2 pounds (0.9 kilograms) of weight in a week, or as suggested by your doctor.  Your face, hands, feet, or legs get puffy (swell).  You get exposed to German measles and have never had them.  You get exposed to fifth disease or chickenpox.  You have belly (abdominal) pain.  You have a bad headache that will not go away.  You have watery poop (diarrhea), pain when you pee (urinate), or have shortness of breath.  You start to have problems seeing (blurry or double vision).  You fall, are in a car accident, or have any kind of trauma.  There is mental or physical violence at home.  You have any concerns or worries during your pregnancy. MAKE SURE YOU:   Understand these instructions.  Will watch your condition.  Will get help  right away if you are not doing well or get worse. Document Released: 06/27/2009 Document Revised: 06/25/2011 Document Reviewed: 06/27/2009 [employer] Patient Information 2014 [employer].  

## 2012-11-11 ENCOUNTER — Institutional Professional Consult (permissible substitution): Payer: Managed Care, Other (non HMO)

## 2012-11-11 DIAGNOSIS — O09299 Supervision of pregnancy with other poor reproductive or obstetric history, unspecified trimester: Secondary | ICD-10-CM | POA: Insufficient documentation

## 2012-11-11 DIAGNOSIS — O0990 Supervision of high risk pregnancy, unspecified, unspecified trimester: Secondary | ICD-10-CM | POA: Insufficient documentation

## 2012-11-18 ENCOUNTER — Encounter: Payer: Self-pay | Admitting: Obstetrics & Gynecology

## 2012-11-18 DIAGNOSIS — D259 Leiomyoma of uterus, unspecified: Secondary | ICD-10-CM | POA: Insufficient documentation

## 2012-11-25 ENCOUNTER — Institutional Professional Consult (permissible substitution): Payer: Managed Care, Other (non HMO)

## 2012-12-02 ENCOUNTER — Ambulatory Visit: Payer: Managed Care, Other (non HMO) | Admitting: Nurse Practitioner

## 2012-12-08 ENCOUNTER — Ambulatory Visit (INDEPENDENT_AMBULATORY_CARE_PROVIDER_SITE_OTHER): Payer: Managed Care, Other (non HMO) | Admitting: Obstetrics & Gynecology

## 2012-12-08 VITALS — BP 113/80 | Temp 98.5°F | Wt 165.0 lb

## 2012-12-08 DIAGNOSIS — Z3482 Encounter for supervision of other normal pregnancy, second trimester: Secondary | ICD-10-CM

## 2012-12-08 DIAGNOSIS — Z3480 Encounter for supervision of other normal pregnancy, unspecified trimester: Secondary | ICD-10-CM

## 2012-12-08 LAB — POCT URINALYSIS DIPSTICK
Bilirubin, UA: NEGATIVE
Protein, UA: NEGATIVE
Spec Grav, UA: 1.02
pH, UA: 6

## 2012-12-08 NOTE — Progress Notes (Signed)
P=111 

## 2012-12-09 ENCOUNTER — Encounter: Payer: Self-pay | Admitting: Obstetrics & Gynecology

## 2012-12-09 ENCOUNTER — Telehealth: Payer: Self-pay | Admitting: Oncology

## 2012-12-09 ENCOUNTER — Ambulatory Visit (HOSPITAL_COMMUNITY)
Admission: RE | Admit: 2012-12-09 | Discharge: 2012-12-09 | Disposition: A | Discharge status: Home / Self Care | Payer: Managed Care, Other (non HMO) | Source: Ambulatory Visit | Attending: Oncology | Admitting: Oncology

## 2012-12-09 ENCOUNTER — Ambulatory Visit (HOSPITAL_BASED_OUTPATIENT_CLINIC_OR_DEPARTMENT_OTHER): Payer: Managed Care, Other (non HMO) | Admitting: Nurse Practitioner

## 2012-12-09 VITALS — BP 126/87 | HR 95 | Temp 97.2°F | Resp 18 | Ht 59.5 in | Wt 162.9 lb

## 2012-12-09 DIAGNOSIS — C8589 Other specified types of non-Hodgkin lymphoma, extranodal and solid organ sites: Secondary | ICD-10-CM

## 2012-12-09 DIAGNOSIS — Z331 Pregnant state, incidental: Secondary | ICD-10-CM | POA: Insufficient documentation

## 2012-12-09 DIAGNOSIS — D47Z2 Castleman disease: Secondary | ICD-10-CM

## 2012-12-09 DIAGNOSIS — R0789 Other chest pain: Secondary | ICD-10-CM

## 2012-12-09 DIAGNOSIS — R222 Localized swelling, mass and lump, trunk: Secondary | ICD-10-CM | POA: Insufficient documentation

## 2012-12-09 DIAGNOSIS — R599 Enlarged lymph nodes, unspecified: Secondary | ICD-10-CM

## 2012-12-09 NOTE — Telephone Encounter (Signed)
gave pt appt for MD only on March per pt rqst

## 2012-12-09 NOTE — Patient Instructions (Signed)
Pregnancy - Second Trimester The second trimester is the period between 13 to 27 weeks of your pregnancy. It is important to follow your doctor's instructions. HOME CARE   Do not smoke.  Do not drink alcohol or use drugs.  Only take medicine as told by your doctor.  Take prenatal vitamins as told. The vitamin should contain 1 milligram of folic acid.  Exercise.  Eat healthy foods. Eat regular, well-balanced meals.  You can have sex (intercourse) if there are no other problems with the pregnancy.  Do not use hot tubs, steam rooms, or saunas.  Wear a seat belt while driving.  Avoid raw meat, uncooked cheese, and litter boxes and soil used by cats.  Visit your dentist. Cleanings are okay. GET HELP RIGHT AWAY IF:   You have a temperature by mouth above 102 F (38.9 C), not controlled by medicine.  Fluid is coming from your vagina.  Blood is coming from your vagina. Light spotting is common, especially after sex (intercourse).  You have a bad smelling fluid (discharge) coming from the vagina. The fluid changes from clear to white.  You still feel sick to your stomach (nauseous).  You throw up (vomit) blood.  You lose or gain more than 2 pounds (0.9 kilograms) of weight in a week, or as suggested by your doctor.  Your face, hands, feet, or legs get puffy (swell).  You get exposed to German measles and have never had them.  You get exposed to fifth disease or chickenpox.  You have belly (abdominal) pain.  You have a bad headache that will not go away.  You have watery poop (diarrhea), pain when you pee (urinate), or have shortness of breath.  You start to have problems seeing (blurry or double vision).  You fall, are in a car accident, or have any kind of trauma.  There is mental or physical violence at home.  You have any concerns or worries during your pregnancy. MAKE SURE YOU:   Understand these instructions.  Will watch your condition.  Will get help  right away if you are not doing well or get worse. Document Released: 06/27/2009 Document Revised: 06/25/2011 Document Reviewed: 06/27/2009 [employer] Patient Information 2014 [employer].  

## 2012-12-09 NOTE — Progress Notes (Signed)
OFFICE PROGRESS NOTE  Interval history:  Tracy Downs returns as scheduled. She feels well. She reports being approximately 4 months pregnant. She has mild intermittent chest pain. No shortness of breath or cough. No fever or sweats.   Objective: Blood pressure 126/87, pulse 95, temperature 97.2 F (36.2 C), temperature source Oral, resp. rate 18, height 4' 11.5" (1.511 m), weight 162 lb 14.4 oz (73.891 kg), last menstrual period 08/05/2012.  Oropharynx is without thrush or ulceration. No palpable cervical, supraclavicular or inguinal lymph nodes. Shotty bilateral axillary lymph nodes. Lungs clear. No wheezes or rales. Regular cardiac rhythm. Abdomen is consistent with pregnancy. No obvious organomegaly. No leg edema.  Lab Results: Lab Results  Component Value Date   WBC 11.6* 10/08/2012   HGB 14.3 10/08/2012   HCT 43.3 10/08/2012   MCV 88.4 10/08/2012   PLT 358 10/08/2012    Chemistry:    Chemistry      Component Value Date/Time   NA 135 11/07/2009 0815   K 4.4 11/07/2009 0815   CL 103 11/07/2009 0815   CO2 23 11/07/2009 0815   BUN 16 11/07/2009 0815   CREATININE 0.93 11/07/2009 0815      Component Value Date/Time   CALCIUM 8.9 11/07/2009 0815   ALKPHOS 58 06/20/2009 1522   AST 22 06/20/2009 1522   ALT 20 06/20/2009 1522   BILITOT 0.6 06/20/2009 1522       Studies/Results: Dg Chest 2 View  12/09/2012   *RADIOLOGY REPORT*  Clinical Data: Follow up mediastinal mass, lymphoma, 4 months pregnant  CHEST - 2 VIEW  Comparison: 04/01/2012  Findings: Stable rounded right paratracheal nodal mass.  The lungs are clear. No pleural effusion or pneumothorax.  The heart is normal in size.  Visualized osseous structures are within normal limits.  IMPRESSION: Stable right paratracheal nodal mass.   Original Report Authenticated By: Charline Bills, M.D.    Medications: I have reviewed the patient's current medications.  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 A mediastinoscopy 06/21/2009 with biopsy of the mediastinal mass confirmed Castleman's disease. Staging CTs of the chest, abdomen and pelvis 08/12/2009 confirmed a right paratracheal mass, a superior mediastinal lymph node and a left cervical/supraclavicular lymph node. There was no evidence of Castleman's disease in the abdomen or pelvis. A CT on 11/07/2009 was stable. 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 2. G1 P1. Currently 4 months into a second pregnancy. 3. Intermittent mild chest discomfort, typically position related. Potentially related to the mediastinal mass. Stable.  Disposition-Tracy Downs appears stable. The chest x-ray is unchanged. She will return for a followup visit in 6 months. She will contact the office in the interim with any problems. We specifically discussed increased chest pain, shortness of breath, cough, fevers/sweats.  Plan reviewed with Dr. Truett Perna.  We reviewed the chest x-ray image on the computer with Tracy Downs.  Lonna Cobb ANP/GNP-BC

## 2012-12-09 NOTE — Progress Notes (Signed)
Doing well 

## 2012-12-23 ENCOUNTER — Other Ambulatory Visit: Payer: Self-pay | Admitting: *Deleted

## 2012-12-23 ENCOUNTER — Ambulatory Visit (INDEPENDENT_AMBULATORY_CARE_PROVIDER_SITE_OTHER): Payer: Managed Care, Other (non HMO)

## 2012-12-23 DIAGNOSIS — Z1389 Encounter for screening for other disorder: Secondary | ICD-10-CM

## 2012-12-23 DIAGNOSIS — Z3480 Encounter for supervision of other normal pregnancy, unspecified trimester: Secondary | ICD-10-CM

## 2012-12-24 ENCOUNTER — Encounter: Payer: Self-pay | Admitting: Obstetrics & Gynecology

## 2012-12-24 LAB — US OB DETAIL + 14 WK

## 2012-12-25 ENCOUNTER — Encounter: Payer: Self-pay | Admitting: Obstetrics & Gynecology

## 2012-12-25 LAB — US OB DETAIL + 14 WK

## 2013-01-01 ENCOUNTER — Encounter: Payer: Self-pay | Admitting: Obstetrics & Gynecology

## 2013-01-01 ENCOUNTER — Ambulatory Visit (INDEPENDENT_AMBULATORY_CARE_PROVIDER_SITE_OTHER): Payer: Managed Care, Other (non HMO) | Admitting: Obstetrics & Gynecology

## 2013-01-01 VITALS — BP 116/77 | Temp 98.3°F | Wt 166.0 lb

## 2013-01-01 DIAGNOSIS — Z3482 Encounter for supervision of other normal pregnancy, second trimester: Secondary | ICD-10-CM

## 2013-01-01 DIAGNOSIS — Z3480 Encounter for supervision of other normal pregnancy, unspecified trimester: Secondary | ICD-10-CM

## 2013-01-01 LAB — POCT URINALYSIS DIPSTICK
Bilirubin, UA: NEGATIVE
Blood, UA: NEGATIVE
Glucose, UA: NEGATIVE
Leukocytes, UA: NEGATIVE
Nitrite, UA: NEGATIVE
Urobilinogen, UA: NEGATIVE

## 2013-01-01 NOTE — Progress Notes (Signed)
Pulse: 86

## 2013-01-26 ENCOUNTER — Other Ambulatory Visit: Payer: Managed Care, Other (non HMO)

## 2013-01-26 ENCOUNTER — Ambulatory Visit (INDEPENDENT_AMBULATORY_CARE_PROVIDER_SITE_OTHER): Payer: Managed Care, Other (non HMO) | Admitting: Obstetrics & Gynecology

## 2013-01-26 ENCOUNTER — Encounter: Payer: Self-pay | Admitting: Obstetrics & Gynecology

## 2013-01-26 VITALS — BP 127/74 | Temp 97.6°F | Wt 171.0 lb

## 2013-01-26 DIAGNOSIS — N390 Urinary tract infection, site not specified: Secondary | ICD-10-CM

## 2013-01-26 DIAGNOSIS — Z3480 Encounter for supervision of other normal pregnancy, unspecified trimester: Secondary | ICD-10-CM

## 2013-01-26 DIAGNOSIS — Z3482 Encounter for supervision of other normal pregnancy, second trimester: Secondary | ICD-10-CM

## 2013-01-26 LAB — POCT URINALYSIS DIPSTICK
Glucose, UA: NEGATIVE
Leukocytes, UA: NEGATIVE
Nitrite, UA: NEGATIVE
Protein, UA: NEGATIVE
Urobilinogen, UA: NEGATIVE

## 2013-01-26 LAB — CBC
MCH: 29.9 pg (ref 26.0–34.0)
MCHC: 33.5 g/dL (ref 30.0–36.0)
MCV: 89.1 fL (ref 78.0–100.0)
Platelets: 231 10*3/uL (ref 150–400)
RBC: 4.32 MIL/uL (ref 3.87–5.11)

## 2013-01-26 NOTE — Progress Notes (Signed)
Pulse- 104 Counseled re: excessive weight gain.

## 2013-01-27 LAB — RPR

## 2013-01-27 LAB — HIV ANTIBODY (ROUTINE TESTING W REFLEX): HIV: NONREACTIVE

## 2013-01-27 LAB — GLUCOSE TOLERANCE, 2 HOURS W/ 1HR
Glucose, 1 hour: 67 mg/dL — ABNORMAL LOW (ref 70–170)
Glucose, Fasting: 74 mg/dL (ref 70–99)

## 2013-02-09 ENCOUNTER — Ambulatory Visit (INDEPENDENT_AMBULATORY_CARE_PROVIDER_SITE_OTHER): Payer: Managed Care, Other (non HMO) | Admitting: Obstetrics & Gynecology

## 2013-02-09 ENCOUNTER — Encounter: Payer: Self-pay | Admitting: Obstetrics & Gynecology

## 2013-02-09 VITALS — BP 114/76 | Temp 98.1°F | Wt 170.6 lb

## 2013-02-09 DIAGNOSIS — Z3480 Encounter for supervision of other normal pregnancy, unspecified trimester: Secondary | ICD-10-CM

## 2013-02-09 DIAGNOSIS — Z3482 Encounter for supervision of other normal pregnancy, second trimester: Secondary | ICD-10-CM

## 2013-02-09 LAB — POCT URINALYSIS DIPSTICK
Blood, UA: NEGATIVE
Nitrite, UA: NEGATIVE
Spec Grav, UA: 1.02
Urobilinogen, UA: NEGATIVE
pH, UA: 5

## 2013-02-09 NOTE — Progress Notes (Signed)
Pulse- 92 Doing well.

## 2013-02-19 ENCOUNTER — Other Ambulatory Visit: Payer: Self-pay

## 2013-02-23 ENCOUNTER — Encounter: Payer: Managed Care, Other (non HMO) | Admitting: Obstetrics & Gynecology

## 2013-02-25 ENCOUNTER — Encounter: Payer: Self-pay | Admitting: Obstetrics & Gynecology

## 2013-02-25 ENCOUNTER — Ambulatory Visit (INDEPENDENT_AMBULATORY_CARE_PROVIDER_SITE_OTHER): Payer: Managed Care, Other (non HMO) | Admitting: Obstetrics & Gynecology

## 2013-02-25 VITALS — BP 111/74 | Temp 98.2°F | Wt 169.8 lb

## 2013-02-25 DIAGNOSIS — Z3482 Encounter for supervision of other normal pregnancy, second trimester: Secondary | ICD-10-CM

## 2013-02-25 DIAGNOSIS — Z3480 Encounter for supervision of other normal pregnancy, unspecified trimester: Secondary | ICD-10-CM

## 2013-02-25 LAB — POCT URINALYSIS DIPSTICK
Blood, UA: NEGATIVE
Glucose, UA: NEGATIVE
Ketones, UA: NEGATIVE
Nitrite, UA: NEGATIVE
Spec Grav, UA: 1.025
Urobilinogen, UA: NEGATIVE

## 2013-02-25 NOTE — Progress Notes (Signed)
P 102 Patient reports she is doing well

## 2013-02-25 NOTE — Patient Instructions (Signed)
Third Trimester of Pregnancy  The third trimester is from week 29 through week 42, months 7 through 9. The third trimester is a time when the fetus is growing rapidly. At the end of the ninth month, the fetus is about 20 inches in length and weighs 6 10 pounds.   BODY CHANGES  Your body goes through many changes during pregnancy. The changes vary from woman to woman.    Your weight will continue to increase. You can expect to gain 25 35 pounds (11 16 kg) by the end of the pregnancy.   You may begin to get stretch marks on your hips, abdomen, and breasts.   You may urinate more often because the fetus is moving lower into your pelvis and pressing on your bladder.   You may develop or continue to have heartburn as a result of your pregnancy.   You may develop constipation because certain hormones are causing the muscles that push waste through your intestines to slow down.   You may develop hemorrhoids or swollen, bulging veins (varicose veins).   You may have pelvic pain because of the weight gain and pregnancy hormones relaxing your joints between the bones in your pelvis. Back aches may result from over exertion of the muscles supporting your posture.   Your breasts will continue to grow and be tender. A yellow discharge may leak from your breasts called colostrum.   Your belly button may stick out.   You may feel short of breath because of your expanding uterus.   You may notice the fetus "dropping," or moving lower in your abdomen.   You may have a bloody mucus discharge. This usually occurs a few days to a week before labor begins.   Your cervix becomes thin and soft (effaced) near your due date.  WHAT TO EXPECT AT YOUR PRENATAL EXAMS   You will have prenatal exams every 2 weeks until week 36. Then, you will have weekly prenatal exams. During a routine prenatal visit:   You will be weighed to make sure you and the fetus are growing normally.   Your blood pressure is taken.   Your abdomen will be  measured to track your baby's growth.   The fetal heartbeat will be listened to.   Any test results from the previous visit will be discussed.   You may have a cervical check near your due date to see if you have effaced.  At around 36 weeks, your caregiver will check your cervix. At the same time, your caregiver will also perform a test on the secretions of the vaginal tissue. This test is to determine if a type of bacteria, Group B streptococcus, is present. Your caregiver will explain this further.  Your caregiver may ask you:   What your birth plan is.   How you are feeling.   If you are feeling the baby move.   If you have had any abnormal symptoms, such as leaking fluid, bleeding, severe headaches, or abdominal cramping.   If you have any questions.  Other tests or screenings that may be performed during your third trimester include:   Blood tests that check for low iron levels (anemia).   Fetal testing to check the health, activity level, and growth of the fetus. Testing is done if you have certain medical conditions or if there are problems during the pregnancy.  FALSE LABOR  You may feel small, irregular contractions that eventually go away. These are called Braxton Hicks contractions, or   false labor. Contractions may last for hours, days, or even weeks before true labor sets in. If contractions come at regular intervals, intensify, or become painful, it is best to be seen by your caregiver.   SIGNS OF LABOR    Menstrual-like cramps.   Contractions that are 5 minutes apart or less.   Contractions that start on the top of the uterus and spread down to the lower abdomen and back.   A sense of increased pelvic pressure or back pain.   A watery or bloody mucus discharge that comes from the vagina.  If you have any of these signs before the 37th week of pregnancy, call your caregiver right away. You need to go to the hospital to get checked immediately.  HOME CARE INSTRUCTIONS    Avoid all  smoking, herbs, alcohol, and unprescribed drugs. These chemicals affect the formation and growth of the baby.   Follow your caregiver's instructions regarding medicine use. There are medicines that are either safe or unsafe to take during pregnancy.   Exercise only as directed by your caregiver. Experiencing uterine cramps is a good sign to stop exercising.   Continue to eat regular, healthy meals.   Wear a good support bra for breast tenderness.   Do not use hot tubs, steam rooms, or saunas.   Wear your seat belt at all times when driving.   Avoid raw meat, uncooked cheese, cat litter boxes, and soil used by cats. These carry germs that can cause birth defects in the baby.   Take your prenatal vitamins.   Try taking a stool softener (if your caregiver approves) if you develop constipation. Eat more high-fiber foods, such as fresh vegetables or fruit and whole grains. Drink plenty of fluids to keep your urine clear or pale yellow.   Take warm sitz baths to soothe any pain or discomfort caused by hemorrhoids. Use hemorrhoid cream if your caregiver approves.   If you develop varicose veins, wear support hose. Elevate your feet for 15 minutes, 3 4 times a day. Limit salt in your diet.   Avoid heavy lifting, wear low heal shoes, and practice good posture.   Rest a lot with your legs elevated if you have leg cramps or low back pain.   Visit your dentist if you have not gone during your pregnancy. Use a soft toothbrush to brush your teeth and be gentle when you floss.   A sexual relationship may be continued unless your caregiver directs you otherwise.   Do not travel far distances unless it is absolutely necessary and only with the approval of your caregiver.   Take prenatal classes to understand, practice, and ask questions about the labor and delivery.   Make a trial run to the hospital.   Pack your hospital bag.   Prepare the baby's nursery.   Continue to go to all your prenatal visits as directed  by your caregiver.  SEEK MEDICAL CARE IF:   You are unsure if you are in labor or if your water has broken.   You have dizziness.   You have mild pelvic cramps, pelvic pressure, or nagging pain in your abdominal area.   You have persistent nausea, vomiting, or diarrhea.   You have a bad smelling vaginal discharge.   You have pain with urination.  SEEK IMMEDIATE MEDICAL CARE IF:    You have a fever.   You are leaking fluid from your vagina.   You have spotting or bleeding from your vagina.     You have severe abdominal cramping or pain.   You have rapid weight loss or gain.   You have shortness of breath with chest pain.   You notice sudden or extreme swelling of your face, hands, ankles, feet, or legs.   You have not felt your baby move in over an hour.   You have severe headaches that do not go away with medicine.   You have vision changes.  Document Released: 03/27/2001 Document Revised: 12/03/2012 Document Reviewed: 06/03/2012  [employer] Patient Information 2014 [employer].

## 2013-02-26 ENCOUNTER — Encounter: Payer: Managed Care, Other (non HMO) | Admitting: Obstetrics & Gynecology

## 2013-03-16 ENCOUNTER — Encounter: Payer: Self-pay | Admitting: Obstetrics & Gynecology

## 2013-03-16 ENCOUNTER — Ambulatory Visit (INDEPENDENT_AMBULATORY_CARE_PROVIDER_SITE_OTHER): Payer: Managed Care, Other (non HMO) | Admitting: Obstetrics & Gynecology

## 2013-03-16 VITALS — BP 116/76 | Temp 97.6°F | Wt 171.0 lb

## 2013-03-16 DIAGNOSIS — Z3480 Encounter for supervision of other normal pregnancy, unspecified trimester: Secondary | ICD-10-CM

## 2013-03-16 DIAGNOSIS — Z3483 Encounter for supervision of other normal pregnancy, third trimester: Secondary | ICD-10-CM

## 2013-03-16 LAB — POCT URINALYSIS DIPSTICK
Bilirubin, UA: NEGATIVE
Spec Grav, UA: 1.02

## 2013-03-16 NOTE — Progress Notes (Signed)
Pulse 102  Pt is doing well.  Plans COCP.

## 2013-03-16 NOTE — Patient Instructions (Signed)
Contraception Choices Contraception (birth control) is the use of any methods or devices to prevent pregnancy. Below are some methods to help avoid pregnancy. HORMONAL METHODS   Contraceptive implant This is a thin, plastic tube containing progesterone hormone. It does not contain estrogen hormone. Your health care provider inserts the tube in the inner part of the upper arm. The tube can remain in place for up to 3 years. After 3 years, the implant must be removed. The implant prevents the ovaries from releasing an egg (ovulation), thickens the cervical mucus to prevent sperm from entering the uterus, and thins the lining of the inside of the uterus.  Progesterone-only injections These injections are given every 3 months by your health care provider to prevent pregnancy. This synthetic progesterone hormone stops the ovaries from releasing eggs. It also thickens cervical mucus and changes the uterine lining. This makes it harder for sperm to survive in the uterus.  Birth control pills These pills contain estrogen and progesterone hormone. They work by preventing the ovaries from releasing eggs (ovulation). They also cause the cervical mucus to thicken, preventing the sperm from entering the uterus. Birth control pills are prescribed by a health care provider.Birth control pills can also be used to treat heavy periods.  Minipill This type of birth control pill contains only the progesterone hormone. They are taken every day of each month and must be prescribed by your health care provider.  Birth control patch The patch contains hormones similar to those in birth control pills. It must be changed once a week and is prescribed by a health care provider.  Vaginal ring The ring contains hormones similar to those in birth control pills. It is left in the vagina for 3 weeks, removed for 1 week, and then a new one is put back in place. The patient must be comfortable inserting and removing the ring from the  vagina.A health care provider's prescription is necessary.  Emergency contraception Emergency contraceptives prevent pregnancy after unprotected sexual intercourse. This pill can be taken right after sex or up to 5 days after unprotected sex. It is most effective the sooner you take the pills after having sexual intercourse. Most emergency contraceptive pills are available without a prescription. Check with your pharmacist. Do not use emergency contraception as your only form of birth control. BARRIER METHODS   Female condom This is a thin sheath (latex or rubber) that is worn over the penis during sexual intercourse. It can be used with spermicide to increase effectiveness.  Female condom. This is a soft, loose-fitting sheath that is put into the vagina before sexual intercourse.  Diaphragm This is a soft, latex, dome-shaped barrier that must be fitted by a health care provider. It is inserted into the vagina, along with a spermicidal jelly. It is inserted before intercourse. The diaphragm should be left in the vagina for 6 to 8 hours after intercourse.  Cervical cap This is a round, soft, latex or plastic cup that fits over the cervix and must be fitted by a health care provider. The cap can be left in place for up to 48 hours after intercourse.  Sponge This is a soft, circular piece of polyurethane foam. The sponge has spermicide in it. It is inserted into the vagina after wetting it and before sexual intercourse.  Spermicides These are chemicals that kill or block sperm from entering the cervix and uterus. They come in the form of creams, jellies, suppositories, foam, or tablets. They do not require a   prescription. They are inserted into the vagina with an applicator before having sexual intercourse. The process must be repeated every time you have sexual intercourse. INTRAUTERINE CONTRACEPTION  Intrauterine device (IUD) This is a T-shaped device that is put in a woman's uterus during a  menstrual period to prevent pregnancy. There are 2 types:  Copper IUD This type of IUD is wrapped in copper wire and is placed inside the uterus. Copper makes the uterus and fallopian tubes produce a fluid that kills sperm. It can stay in place for 10 years.  Hormone IUD This type of IUD contains the hormone progestin (synthetic progesterone). The hormone thickens the cervical mucus and prevents sperm from entering the uterus, and it also thins the uterine lining to prevent implantation of a fertilized egg. The hormone can weaken or kill the sperm that get into the uterus. It can stay in place for 3 5 years, depending on which type of IUD is used. PERMANENT METHODS OF CONTRACEPTION  Female tubal ligation This is when the woman's fallopian tubes are surgically sealed, tied, or blocked to prevent the egg from traveling to the uterus.  Hysteroscopic sterilization This involves placing a small coil or insert into each fallopian tube. Your doctor uses a technique called hysteroscopy to do the procedure. The device causes scar tissue to form. This results in permanent blockage of the fallopian tubes, so the sperm cannot fertilize the egg. It takes about 3 months after the procedure for the tubes to become blocked. You must use another form of birth control for these 3 months.  Female sterilization This is when the female has the tubes that carry sperm tied off (vasectomy).This blocks sperm from entering the vagina during sexual intercourse. After the procedure, the man can still ejaculate fluid (semen). NATURAL PLANNING METHODS  Natural family planning This is not having sexual intercourse or using a barrier method (condom, diaphragm, cervical cap) on days the woman could become pregnant.  Calendar method This is keeping track of the length of each menstrual cycle and identifying when you are fertile.  Ovulation method This is avoiding sexual intercourse during ovulation.  Symptothermal method This is  avoiding sexual intercourse during ovulation, using a thermometer and ovulation symptoms.  Post ovulation method This is timing sexual intercourse after you have ovulated. Regardless of which type or method of contraception you choose, it is important that you use condoms to protect against the transmission of sexually transmitted infections (STIs). Talk with your health care provider about which form of contraception is most appropriate for you. Document Released: 04/02/2005 Document Revised: 12/03/2012 Document Reviewed: 09/25/2012 [employer] Patient Information 2014 [employer].  

## 2013-03-30 ENCOUNTER — Ambulatory Visit (INDEPENDENT_AMBULATORY_CARE_PROVIDER_SITE_OTHER): Payer: Managed Care, Other (non HMO) | Admitting: Obstetrics & Gynecology

## 2013-03-30 VITALS — BP 126/81 | Temp 98.4°F | Wt 174.0 lb

## 2013-03-30 DIAGNOSIS — Z3480 Encounter for supervision of other normal pregnancy, unspecified trimester: Secondary | ICD-10-CM

## 2013-03-30 DIAGNOSIS — Z3483 Encounter for supervision of other normal pregnancy, third trimester: Secondary | ICD-10-CM

## 2013-03-30 LAB — POCT URINALYSIS DIPSTICK
Bilirubin, UA: NEGATIVE
Glucose, UA: NEGATIVE
Leukocytes, UA: NEGATIVE
Nitrite, UA: NEGATIVE
pH, UA: 5

## 2013-03-30 NOTE — Progress Notes (Signed)
Pulse- 101 Doing well. 

## 2013-03-30 NOTE — Patient Instructions (Signed)
Third Trimester of Pregnancy  The third trimester is from week 29 through week 42, months 7 through 9. The third trimester is a time when the fetus is growing rapidly. At the end of the ninth month, the fetus is about 20 inches in length and weighs 6 10 pounds.   BODY CHANGES  Your body goes through many changes during pregnancy. The changes vary from woman to woman.    Your weight will continue to increase. You can expect to gain 25 35 pounds (11 16 kg) by the end of the pregnancy.   You may begin to get stretch marks on your hips, abdomen, and breasts.   You may urinate more often because the fetus is moving lower into your pelvis and pressing on your bladder.   You may develop or continue to have heartburn as a result of your pregnancy.   You may develop constipation because certain hormones are causing the muscles that push waste through your intestines to slow down.   You may develop hemorrhoids or swollen, bulging veins (varicose veins).   You may have pelvic pain because of the weight gain and pregnancy hormones relaxing your joints between the bones in your pelvis. Back aches may result from over exertion of the muscles supporting your posture.   Your breasts will continue to grow and be tender. A yellow discharge may leak from your breasts called colostrum.   Your belly button may stick out.   You may feel short of breath because of your expanding uterus.   You may notice the fetus "dropping," or moving lower in your abdomen.   You may have a bloody mucus discharge. This usually occurs a few days to a week before labor begins.   Your cervix becomes thin and soft (effaced) near your due date.  WHAT TO EXPECT AT YOUR PRENATAL EXAMS   You will have prenatal exams every 2 weeks until week 36. Then, you will have weekly prenatal exams. During a routine prenatal visit:   You will be weighed to make sure you and the fetus are growing normally.   Your blood pressure is taken.   Your abdomen will be  measured to track your baby's growth.   The fetal heartbeat will be listened to.   Any test results from the previous visit will be discussed.   You may have a cervical check near your due date to see if you have effaced.  At around 36 weeks, your caregiver will check your cervix. At the same time, your caregiver will also perform a test on the secretions of the vaginal tissue. This test is to determine if a type of bacteria, Group B streptococcus, is present. Your caregiver will explain this further.  Your caregiver may ask you:   What your birth plan is.   How you are feeling.   If you are feeling the baby move.   If you have had any abnormal symptoms, such as leaking fluid, bleeding, severe headaches, or abdominal cramping.   If you have any questions.  Other tests or screenings that may be performed during your third trimester include:   Blood tests that check for low iron levels (anemia).   Fetal testing to check the health, activity level, and growth of the fetus. Testing is done if you have certain medical conditions or if there are problems during the pregnancy.  FALSE LABOR  You may feel small, irregular contractions that eventually go away. These are called Braxton Hicks contractions, or   false labor. Contractions may last for hours, days, or even weeks before true labor sets in. If contractions come at regular intervals, intensify, or become painful, it is best to be seen by your caregiver.   SIGNS OF LABOR    Menstrual-like cramps.   Contractions that are 5 minutes apart or less.   Contractions that start on the top of the uterus and spread down to the lower abdomen and back.   A sense of increased pelvic pressure or back pain.   A watery or bloody mucus discharge that comes from the vagina.  If you have any of these signs before the 37th week of pregnancy, call your caregiver right away. You need to go to the hospital to get checked immediately.  HOME CARE INSTRUCTIONS    Avoid all  smoking, herbs, alcohol, and unprescribed drugs. These chemicals affect the formation and growth of the baby.   Follow your caregiver's instructions regarding medicine use. There are medicines that are either safe or unsafe to take during pregnancy.   Exercise only as directed by your caregiver. Experiencing uterine cramps is a good sign to stop exercising.   Continue to eat regular, healthy meals.   Wear a good support bra for breast tenderness.   Do not use hot tubs, steam rooms, or saunas.   Wear your seat belt at all times when driving.   Avoid raw meat, uncooked cheese, cat litter boxes, and soil used by cats. These carry germs that can cause birth defects in the baby.   Take your prenatal vitamins.   Try taking a stool softener (if your caregiver approves) if you develop constipation. Eat more high-fiber foods, such as fresh vegetables or fruit and whole grains. Drink plenty of fluids to keep your urine clear or pale yellow.   Take warm sitz baths to soothe any pain or discomfort caused by hemorrhoids. Use hemorrhoid cream if your caregiver approves.   If you develop varicose veins, wear support hose. Elevate your feet for 15 minutes, 3 4 times a day. Limit salt in your diet.   Avoid heavy lifting, wear low heal shoes, and practice good posture.   Rest a lot with your legs elevated if you have leg cramps or low back pain.   Visit your dentist if you have not gone during your pregnancy. Use a soft toothbrush to brush your teeth and be gentle when you floss.   A sexual relationship may be continued unless your caregiver directs you otherwise.   Do not travel far distances unless it is absolutely necessary and only with the approval of your caregiver.   Take prenatal classes to understand, practice, and ask questions about the labor and delivery.   Make a trial run to the hospital.   Pack your hospital bag.   Prepare the baby's nursery.   Continue to go to all your prenatal visits as directed  by your caregiver.  SEEK MEDICAL CARE IF:   You are unsure if you are in labor or if your water has broken.   You have dizziness.   You have mild pelvic cramps, pelvic pressure, or nagging pain in your abdominal area.   You have persistent nausea, vomiting, or diarrhea.   You have a bad smelling vaginal discharge.   You have pain with urination.  SEEK IMMEDIATE MEDICAL CARE IF:    You have a fever.   You are leaking fluid from your vagina.   You have spotting or bleeding from your vagina.     You have severe abdominal cramping or pain.   You have rapid weight loss or gain.   You have shortness of breath with chest pain.   You notice sudden or extreme swelling of your face, hands, ankles, feet, or legs.   You have not felt your baby move in over an hour.   You have severe headaches that do not go away with medicine.   You have vision changes.  Document Released: 03/27/2001 Document Revised: 12/03/2012 Document Reviewed: 06/03/2012  [employer] Patient Information 2014 [employer].

## 2013-04-13 ENCOUNTER — Ambulatory Visit (INDEPENDENT_AMBULATORY_CARE_PROVIDER_SITE_OTHER): Payer: Managed Care, Other (non HMO) | Admitting: Obstetrics & Gynecology

## 2013-04-13 VITALS — BP 111/72 | Temp 98.0°F | Wt 176.0 lb

## 2013-04-13 DIAGNOSIS — Z3483 Encounter for supervision of other normal pregnancy, third trimester: Secondary | ICD-10-CM

## 2013-04-13 DIAGNOSIS — Z3480 Encounter for supervision of other normal pregnancy, unspecified trimester: Secondary | ICD-10-CM

## 2013-04-13 LAB — POCT URINALYSIS DIPSTICK
Bilirubin, UA: NEGATIVE
Glucose, UA: NEGATIVE
Nitrite, UA: NEGATIVE
Spec Grav, UA: 1.02
Urobilinogen, UA: NEGATIVE

## 2013-04-13 NOTE — Progress Notes (Signed)
Pulse 93 Pt is doing well.  Pt need GBS today.

## 2013-04-16 HISTORY — PX: BILATERAL SALPINGECTOMY: SHX5743

## 2013-04-16 NOTE — L&D Delivery Note (Signed)
Delivery Note At 4:06 PM a viable female was delivered via Vaginal, Spontaneous Delivery by the [facility] Fellow.  APGAR: 8, 9 .   Placenta status: Intact, Spontaneous.  Cord: 3 vessels with the following complications: Knot.    Anesthesia: Epidural  Episiotomy: None Lacerations: 1st degree Suture Repair: 2.0 chromic Est. Blood Loss (mL): 200 ml  Mom to postpartum.  Baby to Couplet care / Skin to Skin.  Downs,Tracy Longsworth A 04/30/2013, 4:28 PM

## 2013-04-20 ENCOUNTER — Ambulatory Visit (INDEPENDENT_AMBULATORY_CARE_PROVIDER_SITE_OTHER): Payer: Managed Care, Other (non HMO) | Admitting: Obstetrics & Gynecology

## 2013-04-20 VITALS — BP 127/79 | Temp 98.6°F | Wt 178.0 lb

## 2013-04-20 DIAGNOSIS — Z3480 Encounter for supervision of other normal pregnancy, unspecified trimester: Secondary | ICD-10-CM

## 2013-04-20 DIAGNOSIS — Z3483 Encounter for supervision of other normal pregnancy, third trimester: Secondary | ICD-10-CM

## 2013-04-20 LAB — POCT URINALYSIS DIPSTICK
BILIRUBIN UA: NEGATIVE
Blood, UA: NEGATIVE
Glucose, UA: NEGATIVE
LEUKOCYTES UA: NEGATIVE
Nitrite, UA: NEGATIVE
SPEC GRAV UA: 1.02
Urobilinogen, UA: NEGATIVE
pH, UA: 5

## 2013-04-20 NOTE — Patient Instructions (Signed)
Patient information: Group B streptococcus and pregnancy (Beyond the Basics)  Authors Karen M Puopolo, MD, PhD Carol J Baker, MD Section Editors Charles J Lockwood, MD Daniel J Sexton, MD Deputy Editor Vanessa A Barss, MD Disclosures  All topics are updated as new evidence becomes available and our peer review process is complete.  Literature review current through: Feb 2014.  This topic last updated: Oct 15, 2011.  INTRODUCTION - Group B streptococcus (GBS) is a bacterium that can cause serious infections in pregnant women and newborn babies. GBS is one of many types of streptococcal bacteria, sometimes called "strep." This article discusses GBS, its effect on pregnant women and infants, and ways to prevent complications of GBS. More detailed information about GBS is available by subscription. (See "Group B streptococcal infection in pregnant women".) WHAT IS GROUP B STREP INFECTION? - GBS is commonly found in the digestive system and the vagina. In healthy adults, GBS is not harmful and does not cause problems. But in pregnant women and newborn infants, being infected with GBS can cause serious illness. Approximately one in three to four pregnant women in the [place] carries GBS in their gastrointestinal system and/or in their vagina. Carrying GBS is not the same as being infected. Carriers are not sick and do not need treatment during pregnancy. There is no treatment that can stop you from carrying GBS.  Pregnant women who are carriers of GBS infrequently become infected with GBS. GBS can cause urinary tract infections, infection of the amniotic fluid (bag of water), and infection of the uterus after delivery. GBS infections during pregnancy may lead to preterm labor.  Pregnant women who carry GBS can pass on the bacteria to their newborns, and some of those babies become infected with GBS. Newborns who are infected with GBS can develop pneumonia (lung infection), septicemia (blood infection), or  meningitis (infection of the lining of the brain and spinal cord). These complications can be prevented by giving intravenous antibiotics during labor to any woman who is at risk of GBS infection. You are at risk of GBS infection if: You have a urine culture during your current pregnancy showing GBS  You have a vaginal and rectal culture during your current pregnancy showing GBS  You had an infant infected with GBS in the past GROUP B STREP PREVENTION - Most doctors and nurses recommend a urine culture early in your pregnancy to be sure that you do not have a bladder infection without symptoms. If you urine culture shows GBS or other bacteria, you may be treated with an antibiotic. If you have symptoms of urinary infection, such as pain with urination, any time during your pregnancy, a urine culture is done. If GBS grows from the urine culture, it should be treated with an antibiotic, and you should also receive intravenous antibiotics during labor. Expert groups recommend that all pregnant women have a GBS culture at 35 to 37 weeks of pregnancy. The culture is done by swabbing the vagina and rectum. If your GBS culture is positive, you will be given an intravenous antibiotic during labor. If you have preterm labor, the culture is done then and an intravenous antibiotic is given until the baby is born or the labor is stopped by your health care provider. If you have a positive GBS culture and you have an allergy to penicillin, be sure your doctor and nurse are aware of this allergy and tell them what happened with the allergy. If you had only a rash or itching, this   is not a serious allergy, and you can receive a common drug related to the penicillin. If you had a serious allergy (for example, trouble breathing, swelling of your face) you may need an additional test to determine which antibiotic should be used during labor. Being treated with an antibiotic during labor greatly reduces the chance that you or  your newborn will develop infections related to GBS. It is important to note that young infants up to age 3 months can also develop septicemia, meningitis and other serious infections from GBS. Being treated with an antibiotic during labor does not reduce the chance that your baby will develop this later type of infection. There is currently no known way of preventing this later-onset GBS disease. WHERE TO GET MORE INFORMATION - Your healthcare provider is the best source of information for questions and concerns related to your medical problem.  

## 2013-04-20 NOTE — Progress Notes (Signed)
Pulse 89 Pt is doing well.

## 2013-04-27 ENCOUNTER — Ambulatory Visit (INDEPENDENT_AMBULATORY_CARE_PROVIDER_SITE_OTHER): Payer: Managed Care, Other (non HMO) | Admitting: Obstetrics & Gynecology

## 2013-04-27 ENCOUNTER — Other Ambulatory Visit: Payer: Self-pay | Admitting: *Deleted

## 2013-04-27 VITALS — BP 136/87 | Temp 98.1°F | Wt 179.0 lb

## 2013-04-27 DIAGNOSIS — R030 Elevated blood-pressure reading, without diagnosis of hypertension: Secondary | ICD-10-CM

## 2013-04-27 DIAGNOSIS — Z3483 Encounter for supervision of other normal pregnancy, third trimester: Secondary | ICD-10-CM

## 2013-04-27 DIAGNOSIS — Z3480 Encounter for supervision of other normal pregnancy, unspecified trimester: Secondary | ICD-10-CM

## 2013-04-27 LAB — POCT URINALYSIS DIPSTICK
BILIRUBIN UA: NEGATIVE
Blood, UA: NEGATIVE
GLUCOSE UA: NEGATIVE
NITRITE UA: NEGATIVE
SPEC GRAV UA: 1.025
Urobilinogen, UA: NEGATIVE
pH, UA: 5

## 2013-04-27 LAB — CBC
HCT: 42.1 % (ref 36.0–46.0)
HEMOGLOBIN: 14.7 g/dL (ref 12.0–15.0)
MCH: 30.6 pg (ref 26.0–34.0)
MCHC: 34.9 g/dL (ref 30.0–36.0)
MCV: 87.5 fL (ref 78.0–100.0)
Platelets: 261 10*3/uL (ref 150–400)
RBC: 4.81 MIL/uL (ref 3.87–5.11)
RDW: 14.6 % (ref 11.5–15.5)
WBC: 10.5 10*3/uL (ref 4.0–10.5)

## 2013-04-27 NOTE — Patient Instructions (Signed)
Labor Induction  Labor induction is when steps are taken to cause a pregnant woman to begin the labor process. Most women go into labor on their own between 37 weeks and 42 weeks of the pregnancy. When this does not happen or when there is a medical need, methods may be used to induce labor. Labor induction causes a pregnant woman's uterus to contract. It also causes the cervix to soften (ripen), open (dilate), and thin out (efface). Usually, labor is not induced before 39 weeks of the pregnancy unless there is a problem with the baby or mother.  Before inducing labor, your health care provider will consider a number of factors, including the following:  The medical condition of you and the baby.   How many weeks along you are.   The status of the baby's lung maturity.   The condition of the cervix.   The position of the baby.  WHAT ARE THE REASONS FOR LABOR INDUCTION? Labor may be induced for the following reasons:  The health of the baby or mother is at risk.   The pregnancy is overdue by 1 week or more.   The water breaks but labor does not start on its own.   The mother has a health condition or serious illness, such as high blood pressure, infection, placental abruption, or diabetes.  The amniotic fluid amounts are low around the baby.   The baby is distressed.  Convenience or wanting the baby to be born on a certain date is not a reason for inducing labor. WHAT METHODS ARE USED FOR LABOR INDUCTION? Several methods of labor induction may be used, such as:   Prostaglandin medicine. This medicine causes the cervix to dilate and ripen. The medicine will also start contractions. It can be taken by mouth or by inserting a suppository into the vagina.   Inserting a thin tube (catheter) with a balloon on the end into the vagina to dilate the cervix. Once inserted, the balloon is expanded with water, which causes the cervix to open.   Stripping the membranes. Your health  care provider separates amniotic sac tissue from the cervix, causing the cervix to be stretched and causing the release of a hormone called progesterone. This may cause the uterus to contract. It is often done during an office visit. You will be sent home to wait for the contractions to begin. You will then come in for an induction.   Breaking the water. Your health care provider makes a hole in the amniotic sac using a small instrument. Once the amniotic sac breaks, contractions should begin. This may still take hours to see an effect.   Medicine to trigger or strengthen contractions. This medicine is given through an IV access tube inserted into a vein in your arm.  All of the methods of induction, besides stripping the membranes, will be done in the hospital. Induction is done in the hospital so that you and the baby can be carefully monitored.  HOW LONG DOES IT TAKE FOR LABOR TO BE INDUCED? Some inductions can take up to 2 3 days. Depending on the cervix, it usually takes less time. It takes longer when you are induced early in the pregnancy or if this is your first pregnancy. If a mother is still pregnant and the induction has been going on for 2 3 days, either the mother will be sent home or a cesarean delivery will be needed. WHAT ARE THE RISKS ASSOCIATED WITH LABOR INDUCTION? Some of the risks   of induction include:   Changes in fetal heart rate, such as too high, too low, or erratic.   Fetal distress.   Chance of infection for the mother and baby.   Increased chance of having a cesarean delivery.   Breaking off (abruption) of the placenta from the uterus (rare).   Uterine rupture (very rare).  When induction is needed for medical reasons, the benefits of induction may outweigh the risks. WHAT ARE SOME REASONS FOR NOT INDUCING LABOR? Labor induction should not be done if:   It is shown that your baby does not tolerate labor.   You have had previous surgeries on your  uterus, such as a myomectomy or the removal of fibroids.   Your placenta lies very low in the uterus and blocks the opening of the cervix (placenta previa).   Your baby is not in a head-down position.   The umbilical cord drops down into the birth canal in front of the baby. This could cut off the baby's blood and oxygen supply.   You have had a previous cesarean delivery.   There are unusual circumstances, such as the baby being extremely premature.  Document Released: 08/22/2006 Document Revised: 12/03/2012 Document Reviewed: 10/30/2012 [facility] Patient Information 2014 [place], [place]. Hypertension During Pregnancy Hypertension is also called high blood pressure. It can occur at any time in life and during pregnancy. When you have hypertension, there is extra pressure inside your blood vessels that carry blood from the heart to the rest of your body (arteries). Hypertension during pregnancy can cause problems for you and your baby. Your baby might not weigh as much as it should at birth or might be born early (premature). Very bad cases of hypertension during pregnancy can be life threatening.  Different types of hypertension can occur during pregnancy.   Chronic hypertension. This happens when a woman has hypertension before pregnancy and it continues during pregnancy.  Gestational hypertension. This is when hypertension develops during pregnancy.  Preeclampsia or toxemia of pregnancy. This is a very serious type of hypertension that develops only during pregnancy. It is a disease that affects the whole body (systemic) and can be very dangerous for both mother and baby.  Gestational hypertension and preeclampsia usually go away after your baby is born. Blood pressure generally stabilizes within 6 weeks. Women who have hypertension during pregnancy have a greater chance of developing hypertension later in life or with future pregnancies. RISK FACTORS Some factors make you more  likely to develop hypertension during pregnancy. Risk factors include:  Having hypertension before pregnancy.  Having hypertension during a previous pregnancy.  Being overweight.  Being older than 57.  Being pregnant with more than one baby (multiples).  Having diabetes or kidney problems. SIGNS AND SYMPTOMS Chronic and gestational hypertension rarely cause symptoms. Preeclampsia has symptoms, which may include:  Increased protein in your urine. Your health care provider will check for this at every prenatal visit.  Swelling of your hands and face.  Rapid weight gain.  Headaches.  Visual changes.  Being bothered by light.  Abdominal pain, especially in the right upper area.  Chest pain.  Shortness of breath.  Increased reflexes.  Seizures. Seizures occur with a more severe form of preeclampsia, called eclampsia. DIAGNOSIS   You may be diagnosed with hypertension during a regular prenatal exam. At each visit, tests may include:  Blood pressure checks.  A urine test to check for protein in your urine.  The type of hypertension you are diagnosed with  depends on when you developed it. It also depends on your specific blood pressure reading.  Developing hypertension before 20 weeks of pregnancy is consistent with chronic hypertension.  Developing hypertension after 20 weeks of pregnancy is consistent with gestational hypertension.  Hypertension with increased urinary protein is diagnosed as preeclampsia.  Blood pressure measurements that stay above 937 systolic or 902 diastolic are a sign of severe preeclampsia. TREATMENT Treatment for hypertension during pregnancy varies. Treatment depends on the type of hypertension and how serious it is.  If you take medicine for chronic hypertension, you may need to switch medicines.  Drugs called ACE inhibitors should not be taken during pregnancy.  Low-dose aspirin may be suggested for women who have risk factors for  preeclampsia.  If you have gestational hypertension, you may need to take a blood pressure medicine that is safe during pregnancy. Your health care provider will recommend the appropriate medicine.  If you have severe preeclampsia, you may need to be in the hospital. Health care providers will watch you and the baby very closely. You also may need to take medicine (magnesium sulfate) to prevent seizures and lower blood pressure.  Sometimes an early delivery is needed. This may be the case if the condition worsens. It would be done to protect you and the baby. The only cure for preeclampsia is delivery. HOME CARE INSTRUCTIONS  Schedule and keep all of your regular appointments for prenatal care.  Only take over-the-counter or prescription medicines as directed by your health care provider. Tell your health care provider about all medicines you take.  Eat as little salt as possible.  Get regular exercise.  Do not drink alcohol.  Do not use tobacco products.  Do not drink products with caffeine.  Lie on your left side when resting. SEEK IMMEDIATE MEDICAL CARE IF:  You have severe abdominal pain.  You have sudden swelling in the hands, ankles, or face.  You gain 4 pounds (1.8 kg) or more in 1 week.  You vomit repeatedly.  You have vaginal bleeding.  You do not feel the baby moving as much.  You have a headache.  You have blurred or double vision.  You have muscle twitching or spasms.  You have shortness of breath.  You have blue fingernails and lips.  You have blood in your urine. MAKE SURE YOU:  Understand these instructions.  Will watch your condition.  Will get help right away if you are not doing well or get worse. Document Released: 12/19/2010 Document Revised: 01/21/2013 Document Reviewed: 10/30/2012 [facility] Patient Information 2014 [place], [place].

## 2013-04-27 NOTE — Progress Notes (Signed)
P- 97 Patient states that she is doing well. Patient states she feels a little pressure when the baby is moving. Denies neurological symptoms.

## 2013-04-28 ENCOUNTER — Telehealth (HOSPITAL_COMMUNITY): Payer: Self-pay | Admitting: *Deleted

## 2013-04-28 ENCOUNTER — Encounter (HOSPITAL_COMMUNITY): Payer: Self-pay | Admitting: *Deleted

## 2013-04-28 ENCOUNTER — Ambulatory Visit (INDEPENDENT_AMBULATORY_CARE_PROVIDER_SITE_OTHER): Payer: Managed Care, Other (non HMO)

## 2013-04-28 ENCOUNTER — Other Ambulatory Visit: Payer: Self-pay | Admitting: *Deleted

## 2013-04-28 ENCOUNTER — Other Ambulatory Visit: Payer: Self-pay | Admitting: Obstetrics & Gynecology

## 2013-04-28 DIAGNOSIS — O365990 Maternal care for other known or suspected poor fetal growth, unspecified trimester, not applicable or unspecified: Secondary | ICD-10-CM

## 2013-04-28 DIAGNOSIS — Z1389 Encounter for screening for other disorder: Secondary | ICD-10-CM

## 2013-04-28 LAB — PROTEIN / CREATININE RATIO, URINE
CREATININE, URINE: 154.3 mg/dL
PROTEIN CREATININE RATIO: 0.07 (ref <0.15)
Total Protein, Urine: 11 mg/dL

## 2013-04-28 LAB — PATHOLOGIST SMEAR REVIEW

## 2013-04-28 LAB — CREATININE, SERUM: CREATININE: 0.73 mg/dL (ref 0.50–1.10)

## 2013-04-28 LAB — ALT: ALT: 48 U/L — ABNORMAL HIGH (ref 0–35)

## 2013-04-28 LAB — OB RESULTS CONSOLE GBS: STREP GROUP B AG: POSITIVE

## 2013-04-28 LAB — LACTATE DEHYDROGENASE: LDH: 167 U/L (ref 94–250)

## 2013-04-28 LAB — AST: AST: 39 U/L — AB (ref 0–37)

## 2013-04-28 NOTE — Telephone Encounter (Signed)
Preadmission screen  

## 2013-04-29 ENCOUNTER — Inpatient Hospital Stay (HOSPITAL_COMMUNITY)
Admission: RE | Admit: 2013-04-29 | Discharge: 2013-04-29 | Disposition: A | Discharge status: Home / Self Care | Payer: Managed Care, Other (non HMO) | Source: Ambulatory Visit | Admitting: Obstetrics & Gynecology

## 2013-04-29 ENCOUNTER — Encounter (HOSPITAL_COMMUNITY): Payer: Self-pay

## 2013-04-29 ENCOUNTER — Inpatient Hospital Stay (HOSPITAL_COMMUNITY)
Admission: AD | Admit: 2013-04-29 | Discharge: 2013-05-02 | DRG: 775 | Disposition: A | Discharge status: Home / Self Care | Payer: Managed Care, Other (non HMO) | Source: Ambulatory Visit | Attending: Obstetrics & Gynecology | Admitting: Obstetrics & Gynecology

## 2013-04-29 ENCOUNTER — Encounter: Payer: Self-pay | Admitting: Obstetrics & Gynecology

## 2013-04-29 VITALS — BP 130/81 | HR 101 | Resp 18

## 2013-04-29 DIAGNOSIS — D259 Leiomyoma of uterus, unspecified: Secondary | ICD-10-CM

## 2013-04-29 DIAGNOSIS — O9989 Other specified diseases and conditions complicating pregnancy, childbirth and the puerperium: Secondary | ICD-10-CM

## 2013-04-29 DIAGNOSIS — R8271 Bacteriuria: Secondary | ICD-10-CM

## 2013-04-29 DIAGNOSIS — Z22330 Carrier of Group B streptococcus: Secondary | ICD-10-CM

## 2013-04-29 DIAGNOSIS — O139 Gestational [pregnancy-induced] hypertension without significant proteinuria, unspecified trimester: Principal | ICD-10-CM | POA: Diagnosis present

## 2013-04-29 DIAGNOSIS — O99892 Other specified diseases and conditions complicating childbirth: Secondary | ICD-10-CM | POA: Diagnosis present

## 2013-04-29 LAB — COMPREHENSIVE METABOLIC PANEL
ALK PHOS: 176 U/L — AB (ref 39–117)
ALT: 48 U/L — ABNORMAL HIGH (ref 0–35)
AST: 41 U/L — ABNORMAL HIGH (ref 0–37)
Albumin: 3 g/dL — ABNORMAL LOW (ref 3.5–5.2)
BUN: 7 mg/dL (ref 6–23)
CO2: 22 mEq/L (ref 19–32)
CREATININE: 0.62 mg/dL (ref 0.50–1.10)
Calcium: 9.4 mg/dL (ref 8.4–10.5)
Chloride: 104 mEq/L (ref 96–112)
GFR calc Af Amer: >90 mL/min (ref >90)
GFR calc non Af Amer: >90 mL/min (ref >90)
GLUCOSE: 78 mg/dL (ref 70–99)
POTASSIUM: 4 meq/L (ref 3.7–5.3)
Sodium: 138 mEq/L (ref 137–147)
TOTAL PROTEIN: 5.7 g/dL — AB (ref 6.0–8.3)
Total Bilirubin: 0.3 mg/dL (ref 0.3–1.2)

## 2013-04-29 LAB — CBC
HCT: 41.6 % (ref 36.0–46.0)
Hemoglobin: 14.2 g/dL (ref 12.0–15.0)
MCH: 30.7 pg (ref 26.0–34.0)
MCHC: 34.1 g/dL (ref 30.0–36.0)
MCV: 90 fL (ref 78.0–100.0)
Platelets: 217 10*3/uL (ref 150–400)
RBC: 4.62 MIL/uL (ref 3.87–5.11)
RDW: 14.1 % (ref 11.5–15.5)
WBC: 9.6 10*3/uL (ref 4.0–10.5)

## 2013-04-29 LAB — LACTATE DEHYDROGENASE: LDH: 172 U/L (ref 94–250)

## 2013-04-29 MED ORDER — ACETAMINOPHEN 325 MG PO TABS: 650.0000 mg | ORAL_TABLET | ORAL | Status: DC | PRN

## 2013-04-29 MED ORDER — IBUPROFEN 600 MG PO TABS: 600.0000 mg | ORAL_TABLET | Freq: Four times a day (QID) | ORAL | Status: DC | PRN

## 2013-04-29 MED ORDER — LIDOCAINE HCL (PF) 1 % IJ SOLN
30.0000 mL | INTRAMUSCULAR | Status: DC | PRN
  Administered 2013-04-30: 30 mL via SUBCUTANEOUS
  Filled 2013-04-29 (×2): qty 30

## 2013-04-29 MED ORDER — CLINDAMYCIN PHOSPHATE 900 MG/50ML IV SOLN
900.0000 mg | Freq: Three times a day (TID) | INTRAVENOUS | Status: DC
  Administered 2013-04-29 – 2013-04-30 (×3): 900 mg via INTRAVENOUS
  Filled 2013-04-29 (×4): qty 50

## 2013-04-29 MED ORDER — TERBUTALINE SULFATE 1 MG/ML IJ SOLN: 0.2500 mg | Freq: Once | INTRAMUSCULAR | Status: DC | PRN

## 2013-04-29 MED ORDER — OXYCODONE-ACETAMINOPHEN 5-325 MG PO TABS: 1.0000 | ORAL_TABLET | ORAL | Status: DC | PRN

## 2013-04-29 MED ORDER — OXYTOCIN 40 UNITS IN LACTATED RINGERS INFUSION - SIMPLE MED: 62.5000 mL/h | INTRAVENOUS | Status: DC

## 2013-04-29 MED ORDER — OXYTOCIN BOLUS FROM INFUSION: 500.0000 mL | INTRAVENOUS | Status: DC

## 2013-04-29 MED ORDER — OXYTOCIN 40 UNITS IN LACTATED RINGERS INFUSION - SIMPLE MED
1.0000 m[IU]/min | INTRAVENOUS | Status: DC
  Administered 2013-04-29: 6 m[IU]/min via INTRAVENOUS
  Administered 2013-04-29: 2 m[IU]/min via INTRAVENOUS
  Administered 2013-04-29: 8 m[IU]/min via INTRAVENOUS
  Administered 2013-04-30: 10 m[IU]/min via INTRAVENOUS
  Filled 2013-04-29: qty 1000

## 2013-04-29 MED ORDER — ONDANSETRON HCL 4 MG/2ML IJ SOLN: 4.0000 mg | Freq: Four times a day (QID) | INTRAMUSCULAR | Status: DC | PRN

## 2013-04-29 MED ORDER — LACTATED RINGERS IV SOLN
INTRAVENOUS | Status: DC
  Administered 2013-04-29 – 2013-04-30 (×3): via INTRAVENOUS

## 2013-04-29 MED ORDER — LACTATED RINGERS IV SOLN: 500.0000 mL | INTRAVENOUS | Status: DC | PRN

## 2013-04-29 MED ORDER — CITRIC ACID-SODIUM CITRATE 334-500 MG/5ML PO SOLN
30.0000 mL | ORAL | Status: DC | PRN
  Administered 2013-04-30: 30 mL via ORAL
  Filled 2013-04-29: qty 15

## 2013-04-29 NOTE — H&P (Signed)
Tracy Downs is a 33 y.o. female presenting for IOL. Maternal Medical History:  Reason for admission: She presents for IOL secondary to gestational hypertension.  Recent labs were remarkable for borderline LFT elevations.  Fetal activity: Perceived fetal activity is normal.    Prenatal complications: Gestational hypertension  Prenatal Complications - Diabetes: none.    OB History   Grav Para Term Preterm Abortions TAB SAB Ect Mult Living   2 1 1       1      Past Medical History  Diagnosis Date  . Pre-eclampsia   . Castleman disease   . Deafness in left ear    Past Surgical History  Procedure Laterality Date  . Castleman disease biopsy     Family History: family history includes Cancer in her mother; Diabetes in her maternal grandmother; Hypertension in her maternal grandmother; Kidney disease in her brother. Social History:  reports that she has never smoked. She has never used smokeless tobacco. She reports that she does not drink alcohol or use illicit drugs.     Review of Systems  Constitutional: Negative for fever.  Eyes: Negative for blurred vision.  Respiratory: Negative for shortness of breath.   Gastrointestinal: Negative for vomiting.  Skin: Negative for rash.  Neurological: Negative for headaches.    Dilation: 2 Effacement (%): 70 Station: -3;-2 Exam by:: Franchot Erichsen, RN Blood pressure 119/78, pulse 102, temperature 98.5 F (36.9 C), temperature source Oral, resp. rate 18, height 4\' 11"  (1.499 m), weight 68.947 kg (152 lb), last menstrual period 08/05/2012. Maternal Exam:  Abdomen: Fetal presentation: vertex  Introitus: not evaluated.   Cervix: Cervix evaluated by digital exam.     Fetal Exam Fetal Monitor Review: Variability: moderate (6-25 bpm).   Pattern: accelerations present and no decelerations.    Fetal State Assessment: Category I - tracings are normal.     Physical Exam  Constitutional: She appears well-developed.  HENT:  Head:  Normocephalic.  Neck: Neck supple. No thyromegaly present.  Cardiovascular: Normal rate and regular rhythm.   Respiratory: Breath sounds normal.  GI: Soft. Bowel sounds are normal.  Skin: No rash noted.    Prenatal labs: ABO, Rh: B/POS/-- (06/25 1029) Antibody: NEG (06/25 1029) Rubella: 6.93 (06/25 1029) RPR: NON REAC (10/13 1238)  HBsAg: NEGATIVE (06/25 1029)  HIV: NON REACTIVE (10/13 1238)  GBS: Positive (01/13 0000)   Assessment/Plan: Primipara @ [redacted]w[redacted]d.  Gestational hypertension. GBS positive.  Category I FHT.  Admit Repeat LFTs Low dose Pitocin per protocol GBS prophylaxis   JACKSON-MOORE,Rommie Dunn A 04/29/2013, 10:12 PM

## 2013-04-29 NOTE — Discharge Instructions (Signed)
Hypertension During Pregnancy Hypertension is also called high blood pressure. It can occur at any time in life and during pregnancy. When you have hypertension, there is extra pressure inside your blood vessels that carry blood from the heart to the rest of your body (arteries). Hypertension during pregnancy can cause problems for you and your baby. Your baby might not weigh as much as it should at birth or might be born early (premature). Very bad cases of hypertension during pregnancy can be life threatening.  Different types of hypertension can occur during pregnancy.   Chronic hypertension. This happens when a woman has hypertension before pregnancy and it continues during pregnancy.  Gestational hypertension. This is when hypertension develops during pregnancy.  Preeclampsia or toxemia of pregnancy. This is a very serious type of hypertension that develops only during pregnancy. It is a disease that affects the whole body (systemic) and can be very dangerous for both mother and baby.  Gestational hypertension and preeclampsia usually go away after your baby is born. Blood pressure generally stabilizes within 6 weeks. Women who have hypertension during pregnancy have a greater chance of developing hypertension later in life or with future pregnancies. RISK FACTORS Some factors make you more likely to develop hypertension during pregnancy. Risk factors include:  Having hypertension before pregnancy.  Having hypertension during a previous pregnancy.  Being overweight.  Being older than 40.  Being pregnant with more than one baby (multiples).  Having diabetes or kidney problems. SIGNS AND SYMPTOMS Chronic and gestational hypertension rarely cause symptoms. Preeclampsia has symptoms, which may include:  Increased protein in your urine. Your health care provider will check for this at every prenatal visit.  Swelling of your hands and face.  Rapid weight gain.  Headaches.  Visual  changes.  Being bothered by light.  Abdominal pain, especially in the right upper area.  Chest pain.  Shortness of breath.  Increased reflexes.  Seizures. Seizures occur with a more severe form of preeclampsia, called eclampsia. DIAGNOSIS   You may be diagnosed with hypertension during a regular prenatal exam. At each visit, tests may include:  Blood pressure checks.  A urine test to check for protein in your urine.  The type of hypertension you are diagnosed with depends on when you developed it. It also depends on your specific blood pressure reading.  Developing hypertension before 20 weeks of pregnancy is consistent with chronic hypertension.  Developing hypertension after 20 weeks of pregnancy is consistent with gestational hypertension.  Hypertension with increased urinary protein is diagnosed as preeclampsia.  Blood pressure measurements that stay above 160 systolic or 110 diastolic are a sign of severe preeclampsia. TREATMENT Treatment for hypertension during pregnancy varies. Treatment depends on the type of hypertension and how serious it is.  If you take medicine for chronic hypertension, you may need to switch medicines.  Drugs called ACE inhibitors should not be taken during pregnancy.  Low-dose aspirin may be suggested for women who have risk factors for preeclampsia.  If you have gestational hypertension, you may need to take a blood pressure medicine that is safe during pregnancy. Your health care provider will recommend the appropriate medicine.  If you have severe preeclampsia, you may need to be in the hospital. Health care providers will watch you and the baby very closely. You also may need to take medicine (magnesium sulfate) to prevent seizures and lower blood pressure.  Sometimes an early delivery is needed. This may be the case if the condition worsens. It would   be done to protect you and the baby. The only cure for preeclampsia is delivery. HOME  CARE INSTRUCTIONS  Schedule and keep all of your regular appointments for prenatal care.  Only take over-the-counter or prescription medicines as directed by your health care provider. Tell your health care provider about all medicines you take.  Eat as little salt as possible.  Get regular exercise.  Do not drink alcohol.  Do not use tobacco products.  Do not drink products with caffeine.  Lie on your left side when resting. SEEK IMMEDIATE MEDICAL CARE IF:  You have severe abdominal pain.  You have sudden swelling in the hands, ankles, or face.  You gain 4 pounds (1.8 kg) or more in 1 week.  You vomit repeatedly.  You have vaginal bleeding.  You do not feel the baby moving as much.  You have a headache.  You have blurred or double vision.  You have muscle twitching or spasms.  You have shortness of breath.  You have blue fingernails and lips.  You have blood in your urine. MAKE SURE YOU:  Understand these instructions.  Will watch your condition.  Will get help right away if you are not doing well or get worse. Document Released: 12/19/2010 Document Revised: 01/21/2013 Document Reviewed: 10/30/2012 [employer] Patient Information 2014 [employer].  

## 2013-04-29 NOTE — MAU Note (Signed)
Scheduled for induction today for hypertension;

## 2013-04-30 ENCOUNTER — Encounter: Payer: Self-pay | Admitting: Obstetrics & Gynecology

## 2013-04-30 ENCOUNTER — Encounter (HOSPITAL_COMMUNITY): Payer: Managed Care, Other (non HMO) | Admitting: Anesthesiology

## 2013-04-30 ENCOUNTER — Inpatient Hospital Stay (HOSPITAL_COMMUNITY): Admission: AD | Admit: 2013-04-30 | Payer: Self-pay | Source: Ambulatory Visit | Admitting: Obstetrics

## 2013-04-30 ENCOUNTER — Encounter (HOSPITAL_COMMUNITY): Payer: Self-pay | Admitting: *Deleted

## 2013-04-30 ENCOUNTER — Inpatient Hospital Stay (HOSPITAL_COMMUNITY): Payer: Managed Care, Other (non HMO) | Admitting: Anesthesiology

## 2013-04-30 LAB — RPR: RPR Ser Ql: NONREACTIVE

## 2013-04-30 LAB — CBC
HCT: 41.7 % (ref 36.0–46.0)
Hemoglobin: 14.3 g/dL (ref 12.0–15.0)
MCH: 31 pg (ref 26.0–34.0)
MCHC: 34.3 g/dL (ref 30.0–36.0)
MCV: 90.3 fL (ref 78.0–100.0)
PLATELETS: 231 10*3/uL (ref 150–400)
RBC: 4.62 MIL/uL (ref 3.87–5.11)
RDW: 14.3 % (ref 11.5–15.5)
WBC: 14.1 10*3/uL — ABNORMAL HIGH (ref 4.0–10.5)

## 2013-04-30 LAB — US OB DETAIL + 14 WK

## 2013-04-30 LAB — PROTIME-INR
INR: 0.96 (ref 0.00–1.49)
PROTHROMBIN TIME: 12.6 s (ref 11.6–15.2)

## 2013-04-30 LAB — APTT: aPTT: 26 seconds (ref 24–37)

## 2013-04-30 LAB — FIBRINOGEN: FIBRINOGEN: 592 mg/dL — AB (ref 204–475)

## 2013-04-30 MED ORDER — EPHEDRINE 5 MG/ML INJ
10.0000 mg | INTRAVENOUS | Status: DC | PRN
  Filled 2013-04-30: qty 2
  Filled 2013-04-30: qty 4

## 2013-04-30 MED ORDER — LACTATED RINGERS IV SOLN: 500.0000 mL | Freq: Once | INTRAVENOUS | Status: DC

## 2013-04-30 MED ORDER — ZOLPIDEM TARTRATE 5 MG PO TABS: 5.0000 mg | ORAL_TABLET | Freq: Every evening | ORAL | Status: DC | PRN

## 2013-04-30 MED ORDER — PRENATAL MULTIVITAMIN CH
1.0000 | ORAL_TABLET | Freq: Every day | ORAL | Status: DC
  Administered 2013-05-01: 1 via ORAL
  Filled 2013-04-30: qty 1

## 2013-04-30 MED ORDER — MAGNESIUM HYDROXIDE 400 MG/5ML PO SUSP: 30.0000 mL | ORAL | Status: DC | PRN

## 2013-04-30 MED ORDER — IBUPROFEN 600 MG PO TABS
600.0000 mg | ORAL_TABLET | Freq: Four times a day (QID) | ORAL | Status: DC
  Administered 2013-04-30 – 2013-05-02 (×6): 600 mg via ORAL
  Filled 2013-04-30 (×6): qty 1

## 2013-04-30 MED ORDER — PHENYLEPHRINE 40 MCG/ML (10ML) SYRINGE FOR IV PUSH (FOR BLOOD PRESSURE SUPPORT)
80.0000 ug | PREFILLED_SYRINGE | INTRAVENOUS | Status: DC | PRN
  Filled 2013-04-30: qty 2

## 2013-04-30 MED ORDER — BENZOCAINE-MENTHOL 20-0.5 % EX AERO
1.0000 "application " | INHALATION_SPRAY | CUTANEOUS | Status: DC | PRN
  Administered 2013-05-01: 1 via TOPICAL
  Filled 2013-04-30: qty 56

## 2013-04-30 MED ORDER — WITCH HAZEL-GLYCERIN EX PADS: 1.0000 "application " | MEDICATED_PAD | CUTANEOUS | Status: DC | PRN

## 2013-04-30 MED ORDER — TETANUS-DIPHTH-ACELL PERTUSSIS 5-2.5-18.5 LF-MCG/0.5 IM SUSP: 0.5000 mL | Freq: Once | INTRAMUSCULAR | Status: DC

## 2013-04-30 MED ORDER — MEDROXYPROGESTERONE ACETATE 150 MG/ML IM SUSP: 150.0000 mg | INTRAMUSCULAR | Status: DC | PRN

## 2013-04-30 MED ORDER — ONDANSETRON HCL 4 MG PO TABS
4.0000 mg | ORAL_TABLET | ORAL | Status: DC | PRN
Start: 2013-04-30 — End: 2013-05-02

## 2013-04-30 MED ORDER — FERROUS SULFATE 325 (65 FE) MG PO TABS
325.0000 mg | ORAL_TABLET | Freq: Two times a day (BID) | ORAL | Status: DC
  Administered 2013-05-01 – 2013-05-02 (×3): 325 mg via ORAL
  Filled 2013-04-30 (×3): qty 1

## 2013-04-30 MED ORDER — SENNOSIDES-DOCUSATE SODIUM 8.6-50 MG PO TABS
2.0000 | ORAL_TABLET | ORAL | Status: DC
  Administered 2013-04-30 – 2013-05-02 (×2): 2 via ORAL
  Filled 2013-04-30 (×2): qty 2

## 2013-04-30 MED ORDER — LIDOCAINE HCL (PF) 1 % IJ SOLN
INTRAMUSCULAR | Status: DC | PRN
  Administered 2013-04-30 (×2): 4 mL

## 2013-04-30 MED ORDER — BUTORPHANOL TARTRATE 1 MG/ML IJ SOLN
2.0000 mg | INTRAMUSCULAR | Status: DC | PRN
  Administered 2013-04-30 (×2): 2 mg via INTRAVENOUS
  Filled 2013-04-30 (×3): qty 2

## 2013-04-30 MED ORDER — FENTANYL 2.5 MCG/ML BUPIVACAINE 1/10 % EPIDURAL INFUSION (WH - ANES)
14.0000 mL/h | INTRAMUSCULAR | Status: DC | PRN
  Filled 2013-04-30 (×2): qty 125

## 2013-04-30 MED ORDER — OXYCODONE-ACETAMINOPHEN 5-325 MG PO TABS: 1.0000 | ORAL_TABLET | ORAL | Status: DC | PRN

## 2013-04-30 MED ORDER — LANOLIN HYDROUS EX OINT
TOPICAL_OINTMENT | CUTANEOUS | Status: DC | PRN
Start: 2013-04-30 — End: 2013-05-02

## 2013-04-30 MED ORDER — DIPHENHYDRAMINE HCL 50 MG/ML IJ SOLN: 12.5000 mg | INTRAMUSCULAR | Status: DC | PRN

## 2013-04-30 MED ORDER — MEASLES, MUMPS & RUBELLA VAC ~~LOC~~ INJ: 0.5000 mL | INJECTION | Freq: Once | SUBCUTANEOUS | Status: DC

## 2013-04-30 MED ORDER — ONDANSETRON HCL 4 MG/2ML IJ SOLN: 4.0000 mg | INTRAMUSCULAR | Status: DC | PRN

## 2013-04-30 MED ORDER — DIBUCAINE 1 % RE OINT: 1.0000 "application " | TOPICAL_OINTMENT | RECTAL | Status: DC | PRN

## 2013-04-30 MED ORDER — EPHEDRINE 5 MG/ML INJ
10.0000 mg | INTRAVENOUS | Status: DC | PRN
  Filled 2013-04-30: qty 4
  Filled 2013-04-30: qty 2

## 2013-04-30 MED ORDER — PHENYLEPHRINE 40 MCG/ML (10ML) SYRINGE FOR IV PUSH (FOR BLOOD PRESSURE SUPPORT)
80.0000 ug | PREFILLED_SYRINGE | INTRAVENOUS | Status: DC | PRN
  Filled 2013-04-30 (×2): qty 10
  Filled 2013-04-30: qty 2

## 2013-04-30 MED ORDER — DIPHENHYDRAMINE HCL 25 MG PO CAPS: 25.0000 mg | ORAL_CAPSULE | Freq: Four times a day (QID) | ORAL | Status: DC | PRN

## 2013-04-30 MED ORDER — FENTANYL 2.5 MCG/ML BUPIVACAINE 1/10 % EPIDURAL INFUSION (WH - ANES)
INTRAMUSCULAR | Status: DC | PRN
  Administered 2013-04-30: 11 mL/h via EPIDURAL

## 2013-04-30 NOTE — Anesthesia Preprocedure Evaluation (Signed)
Anesthesia Evaluation  Patient identified by MRN, date of birth, ID band Patient awake    Reviewed: Allergy & Precautions, H&P , NPO status , Patient's Chart, lab work & pertinent test results, reviewed documented beta blocker date and time   History of Anesthesia Complications Negative for: history of anesthetic complications  Airway       Dental   Pulmonary neg pulmonary ROS,          Cardiovascular hypertension (GHTN),     Neuro/Psych Deafness left ear negative psych ROS   GI/Hepatic Neg liver ROS, GERD-  Medicated,  Endo/Other  negative endocrine ROS  Renal/GU negative Renal ROS     Musculoskeletal   Abdominal   Peds  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     Plan Discussed with:   Anesthesia Plan Comments:         Anesthesia Quick Evaluation

## 2013-04-30 NOTE — Anesthesia Procedure Notes (Signed)
Epidural Patient location during procedure: OB Start time: 04/30/2013 9:35 AM  Staffing Anesthesiologist: Telina Kleckley A. Performed by: anesthesiologist   Preanesthetic Checklist Completed: patient identified, site marked, surgical consent, pre-op evaluation, timeout performed, IV checked, risks and benefits discussed and monitors and equipment checked  Epidural Patient position: sitting Prep: site prepped and draped and DuraPrep Patient monitoring: continuous pulse ox and blood pressure Approach: midline Injection technique: LOR air  Needle:  Needle type: Tuohy  Needle gauge: 17 G Needle length: 9 cm and 9 Needle insertion depth: 6 cm Catheter type: closed end flexible Catheter size: 19 Gauge Catheter at skin depth: 11 cm Test dose: negative and Other  Assessment Events: blood not aspirated, injection not painful, no injection resistance, negative IV test and no paresthesia  Additional Notes Patient identified. Risks and benefits discussed including failed block, incomplete  Pain control, post dural puncture headache, nerve damage, paralysis, blood pressure Changes, nausea, vomiting, reactions to medications-both toxic and allergic and post Partum back pain. All questions were answered. Patient expressed understanding and wished to proceed. Sterile technique was used throughout procedure. Epidural site was Dressed with sterile barrier dressing. No paresthesias, signs of intravascular injection Or signs of intrathecal spread were encountered.  Patient was more comfortable after the epidural was dosed. Please see RN's note for documentation of vital signs and FHR which are stable.

## 2013-04-30 NOTE — Progress Notes (Signed)
Tracy Downs is a 33 y.o. G2P1001 at [redacted]w[redacted]d by LMP admitted for induction of labor due to Hypertension.  Subjective:   Objective: BP 110/74  Pulse 126  Temp(Src) 98.4 F (36.9 C) (Oral)  Resp 16  Ht 4\' 11"  (1.499 m)  Wt 152 lb (68.947 kg)  BMI 30.68 kg/m2  SpO2 100%  LMP 08/05/2012      FHT:  FHR: 130 bpm, variability: moderate,  accelerations:  Present,  decelerations:  Present variable UC:   regular, every 2-4 minutes SVE:   Dilation: 7 Effacement (%): 100 Station: -1 Exam by:: lee  Labs: Lab Results  Component Value Date   WBC 14.1* 04/30/2013   HGB 14.3 04/30/2013   HCT 41.7 04/30/2013   MCV 90.3 04/30/2013   PLT 231 04/30/2013    Assessment / Plan: Induction of labor due to gestational hypertension,  progressing well on pitocin  Labor: Progressing normally Preeclampsia:  labs stable Fetal Wellbeing:  Category I Pain Control:  Epidural I/D:  n/a Anticipated MOD:  NSVD  HARPER,CHARLES A 04/30/2013, 12:52 PM

## 2013-04-30 NOTE — Progress Notes (Signed)
Called to assist with AROM. Clear fluid and now complete. Fetus doing well.  Fredrik Rigger, MD OB Fellow

## 2013-05-01 LAB — CBC
HCT: 35.1 % — ABNORMAL LOW (ref 36.0–46.0)
HEMOGLOBIN: 11.9 g/dL — AB (ref 12.0–15.0)
MCH: 30.6 pg (ref 26.0–34.0)
MCHC: 33.9 g/dL (ref 30.0–36.0)
MCV: 90.2 fL (ref 78.0–100.0)
PLATELETS: 203 10*3/uL (ref 150–400)
RBC: 3.89 MIL/uL (ref 3.87–5.11)
RDW: 14.3 % (ref 11.5–15.5)
WBC: 15.5 10*3/uL — AB (ref 4.0–10.5)

## 2013-05-01 NOTE — Anesthesia Postprocedure Evaluation (Signed)
  Anesthesia Post-op Note  Patient: Tracy Downs  Procedure(s) Performed: * No procedures listed *  Patient Location: Mother/Baby  Anesthesia Type:Epidural  Level of Consciousness: awake and alert   Airway and Oxygen Therapy: Patient Spontanous Breathing  Post-op Pain: mild  Post-op Assessment: Patient's Cardiovascular Status Stable, Respiratory Function Stable, No signs of Nausea or vomiting, Pain level controlled, No headache, No residual numbness and No residual motor weakness  Post-op Vital Signs: stable  Complications: No apparent anesthesia complications

## 2013-05-01 NOTE — Progress Notes (Signed)
Post Partum Day 1 Subjective: no complaints  Objective: Blood pressure 120/65, pulse 121, temperature 98.9 F (37.2 C), temperature source Oral, resp. rate 20, height 4\' 11"  (1.499 m), weight 152 lb (68.947 kg), last menstrual period 08/05/2012, SpO2 100.00%, unknown if currently breastfeeding.  Physical Exam:  General: alert and no distress Lochia: appropriate Uterine Fundus: firm Incision: healing well DVT Evaluation: No evidence of DVT seen on physical exam.   Recent Labs  04/29/13 1750 04/30/13 0624  HGB 14.2 14.3  HCT 41.6 41.7    Assessment/Plan: Plan for discharge tomorrow   LOS: 2 days   Makayla Confer A 05/01/2013, 5:34 AM

## 2013-05-02 MED ORDER — IBUPROFEN 600 MG PO TABS: 600.0000 mg | ORAL_TABLET | Freq: Four times a day (QID) | ORAL | Status: DC | PRN

## 2013-05-02 MED ORDER — OXYCODONE-ACETAMINOPHEN 5-325 MG PO TABS: 1.0000 | ORAL_TABLET | ORAL | Status: DC | PRN

## 2013-05-02 NOTE — Discharge Summary (Signed)
Obstetric Discharge Summary Reason for Admission: induction of labor Prenatal Procedures: ultrasound Intrapartum Procedures: spontaneous vaginal delivery Postpartum Procedures: none Complications-Operative and Postpartum: none HGB  Date Value Range Status  11/07/2009 13.9  11.6 - 15.9 g/dL Final     Hemoglobin  Date Value Range Status  05/01/2013 11.9* 12.0 - 15.0 g/dL Final     DELTA CHECK NOTED     REPEATED TO VERIFY     HCT  Date Value Range Status  05/01/2013 35.1* 36.0 - 46.0 % Final  11/07/2009 41.4  34.8 - 46.6 % Final    Physical Exam:  General: alert and no distress Lochia: appropriate Uterine Fundus: firm Incision: healing well DVT Evaluation: No evidence of DVT seen on physical exam.  Discharge Diagnoses: Term Pregnancy-delivered  Discharge Information: Date: 05/02/2013 Activity: pelvic rest Diet: routine Medications: PNV, Ibuprofen, Colace and Percocet Condition: stable Instructions: refer to practice specific booklet Discharge to: home Follow-up Information   Follow up with Lahoma Crocker A, MD. Schedule an appointment as soon as possible for a visit in 2 weeks.   Specialty:  Obstetrics and Gynecology   Contact information:   [address] [place] [address] [phone]       Newborn Data: Live born female  Birth Weight: 5 lb 9.6 oz (2540 g) APGAR: 8, 9  Home with mother.  HARPER,CHARLES A 05/02/2013, 6:58 AM

## 2013-05-02 NOTE — Progress Notes (Signed)
Post Partum Day 2 Subjective: no complaints  Objective: Blood pressure 111/69, pulse 71, temperature 98.6 F (37 C), temperature source Oral, resp. rate 18, height 4\' 11"  (1.499 m), weight 152 lb (68.947 kg), last menstrual period 08/05/2012, SpO2 99.00%, unknown if currently breastfeeding.  Physical Exam:  General: alert and no distress Lochia: appropriate Uterine Fundus: firm Incision: healing well DVT Evaluation: No evidence of DVT seen on physical exam.   Recent Labs  04/30/13 0624 05/01/13 0645  HGB 14.3 11.9*  HCT 41.7 35.1*    Assessment/Plan: Discharge home   LOS: 3 days   HARPER,CHARLES A 05/02/2013, 6:52 AM

## 2013-05-02 NOTE — Discharge Instructions (Signed)
Before [facility] Ask any questions about feeding, diapering, and baby care before you leave the hospital. Ask again if you do not understand. Ask when you need to see the doctor again. There are several things you must have before your baby comes home.  Infant car seat.  Crib.  Do not let your baby sleep in a bed with you or anyone else.  If you do not have a bed for your baby, ask the doctor what you can use that will be safe for the baby to sleep in. Infant feeding supplies:  6 to 8 bottles (8 oz. size).  6 to 8 nipples.  Measuring cup.  Measuring tablespoon.  Bottle brush.  Sterilizer (or use any large pan or kettle with a lid).  Formula that contains iron.  A way to boil and cool water. Breastfeeding supplies:  Breast pump.  Nipple cream. Clothing:  24 to 36 cloth diapers and waterproof diaper covers or a box of disposable diapers. You may need as many as 10 to 12 diapers per day.  3 onesies (other clothing will depend on the time of year and the weather).  3 receiving blankets.  3 baby pajamas or gowns.  3 bibs. Bath equipment:  Mild soap.  Petroleum jelly. No baby oil or powder.  Soft cloth towel and wash cloth.  Cotton balls.  Separate bath basin for baby. Only sponge bathe until umbilical cord and circumcision are healed. Other supplies:  Thermometer and bulb syringe (ask the hospital to send them home with you). Ask your doctor about how you should take your baby's temperature.  One to two pacifiers. Prepare for an emergency:  Know how to get to the hospital and know where to admit your baby.  Put all doctor numbers near your house phone and in your cell phone if you have one. Prepare your family:  Talk with siblings about the baby coming home and how they feel about it.  Decide how you want to handle visitors and other family members.  Take offers for help with the baby. You will need time to adjust. Know when to call the doctor.   GET HELP RIGHT AWAY IF:  Your baby's temperature is greater than 100.4 F (38 C).  The softspot on your baby's head starts to bulge.  Your baby is crying with no tears or has no wet diapers for 6 hours.  Your baby has rapid breathing.  Your baby is not as alert. Document Released: 03/15/2008 Document Revised: 06/25/2011 Document Reviewed: 06/22/2010 [facility] Patient Information 2014 [place], [place].  Breastfeeding Deciding to breastfeed is one of the best choices you can make for you and your baby. A change in hormones during pregnancy causes your breast tissue to grow and increases the number and size of your milk ducts. These hormones also allow proteins, sugars, and fats from your blood supply to make breast milk in your milk-producing glands. Hormones prevent breast milk from being released before your baby is born as well as prompt milk flow after birth. Once breastfeeding has begun, thoughts of your baby, as well as his or her sucking or crying, can stimulate the release of milk from your milk-producing glands.  BENEFITS OF BREASTFEEDING For Your Baby  Your first milk (colostrum) helps your baby's digestive system function better.   There are antibodies in your milk that help your baby fight off infections.   Your baby has a lower incidence of asthma, allergies, and sudden infant death syndrome.  The nutrients in breast milk are better for your baby than infant formulas and are designed uniquely for your baby's needs.   Breast milk improves your baby's brain development.   Your baby is less likely to develop other conditions, such as childhood obesity, asthma, or type 2 diabetes mellitus.  For You   Breastfeeding helps to create a very special bond between you and your baby.   Breastfeeding is convenient. Breast milk is always available at the correct temperature and costs nothing.   Breastfeeding helps to burn calories and helps you lose the weight gained during  pregnancy.   Breastfeeding makes your uterus contract to its prepregnancy size faster and slows bleeding (lochia) after you give birth.   Breastfeeding helps to lower your risk of developing type 2 diabetes mellitus, osteoporosis, and breast or ovarian cancer later in life. SIGNS THAT YOUR BABY IS HUNGRY Early Signs of Hunger  Increased alertness or activity.  Stretching.  Movement of the head from side to side.  Movement of the head and opening of the mouth when the corner of the mouth or cheek is stroked (rooting).  Increased sucking sounds, smacking lips, cooing, sighing, or squeaking.  Hand-to-mouth movements.  Increased sucking of fingers or hands. Late Signs of Hunger  Fussing.  Intermittent crying. Extreme Signs of Hunger Signs of extreme hunger will require calming and consoling before your baby will be able to breastfeed successfully. Do not wait for the following signs of extreme hunger to occur before you initiate breastfeeding:   Restlessness.  A loud, strong cry.   Screaming. BREASTFEEDING BASICS Breastfeeding Initiation  Find a comfortable place to sit or lie down, with your neck and back well supported.  Place a pillow or rolled up blanket under your baby to bring him or her to the level of your breast (if you are seated). Nursing pillows are specially designed to help support your arms and your baby while you breastfeed.  Make sure that your baby's abdomen is facing your abdomen.   Gently massage your breast. With your fingertips, massage from your chest wall toward your nipple in a circular motion. This encourages milk flow. You may need to continue this action during the feeding if your milk flows slowly.  Support your breast with 4 fingers underneath and your thumb above your nipple. Make sure your fingers are well away from your nipple and your baby's mouth.   Stroke your baby's lips gently with your finger or nipple.   When your baby's  mouth is open wide enough, quickly bring your baby to your breast, placing your entire nipple and as much of the colored area around your nipple (areola) as possible into your baby's mouth.   More areola should be visible above your baby's upper lip than below the lower lip.   Your baby's tongue should be between his or her lower gum and your breast.   Ensure that your baby's mouth is correctly positioned around your nipple (latched). Your baby's lips should create a seal on your breast and be turned out (everted).  It is common for your baby to suck about 2 3 minutes in order to start the flow of breast milk. Latching Teaching your baby how to latch on to your breast properly is very important. An improper latch can cause nipple pain and decreased milk supply for you and poor weight gain in your baby. Also, if your baby is not latched onto your nipple properly, he or she may swallow some  air during feeding. This can make your baby fussy. Burping your baby when you switch breasts during the feeding can help to get rid of the air. However, teaching your baby to latch on properly is still the best way to prevent fussiness from swallowing air while breastfeeding. Signs that your baby has successfully latched on to your nipple:    Silent tugging or silent sucking, without causing you pain.   Swallowing heard between every 3 4 sucks.    Muscle movement above and in front of his or her ears while sucking.  Signs that your baby has not successfully latched on to nipple:   Sucking sounds or smacking sounds from your baby while breastfeeding.  Nipple pain. If you think your baby has not latched on correctly, slip your finger into the corner of your baby's mouth to break the suction and place it between your baby's gums. Attempt breastfeeding initiation again. Signs of Successful Breastfeeding Signs from your baby:   A gradual decrease in the number of sucks or complete cessation of sucking.    Falling asleep.   Relaxation of his or her body.   Retention of a small amount of milk in his or her mouth.   Letting go of your breast by himself or herself. Signs from you:  Breasts that have increased in firmness, weight, and size 1 3 hours after feeding.   Breasts that are softer immediately after breastfeeding.  Increased milk volume, as well as a change in milk consistency and color by the 5th day of breastfeeding.   Nipples that are not sore, cracked, or bleeding. Signs That Your [employer]el Books is Getting Enough Milk  Wetting at least 3 diapers in a 24-hour period. The urine should be clear and pale yellow by age 62 days.  At least 3 stools in a 24-hour period by age 62 days. The stool should be soft and yellow.  At least 3 stools in a 24-hour period by age 53 days. The stool should be seedy and yellow.  No loss of weight greater than 10% of birth weight during the first 50 days of age.  Average weight gain of 4 7 ounces (120 210 mL) per week after age 12 days.  Consistent daily weight gain by age 121 days, without weight loss after the age of 2 weeks. After a feeding, your baby may spit up a small amount. This is common. BREASTFEEDING FREQUENCY AND DURATION Frequent feeding will help you make more milk and can prevent sore nipples and breast engorgement. Breastfeed when you feel the need to reduce the fullness of your breasts or when your baby shows signs of hunger. This is called "breastfeeding on demand." Avoid introducing a pacifier to your baby while you are working to establish breastfeeding (the first 4 6 weeks after your baby is born). After this time you may choose to use a pacifier. Research has shown that pacifier use during the first year of a baby's life decreases the risk of sudden infant death syndrome (SIDS). Allow your baby to feed on each breast as long as he or she wants. Breastfeed until your baby is finished feeding. When your baby unlatches or falls asleep while  feeding from the first breast, offer the second breast. Because newborns are often sleepy in the first few weeks of life, you may need to awaken your baby to get him or her to feed. Breastfeeding times will vary from baby to baby. However, the following rules can serve as a guide  to help you ensure that your baby is properly fed:  Newborns (babies 63 weeks of age or younger) may breastfeed every 1 3 hours.  Newborns should not go longer than 3 hours during the day or 5 hours during the night without breastfeeding.  You should breastfeed your baby a minimum of 8 times in a 24-hour period until you begin to introduce solid foods to your baby at around 21 months of age. BREAST MILK PUMPING Pumping and storing breast milk allows you to ensure that your baby is exclusively fed your breast milk, even at times when you are unable to breastfeed. This is especially important if you are going back to work while you are still breastfeeding or when you are not able to be present during feedings. Your lactation consultant can give you guidelines on how long it is safe to store breast milk.  A breast pump is a machine that allows you to pump milk from your breast into a sterile bottle. The pumped breast milk can then be stored in a refrigerator or freezer. Some breast pumps are operated by hand, while others use electricity. Ask your lactation consultant which type will work best for you. Breast pumps can be purchased, but some hospitals and breastfeeding support groups lease breast pumps on a monthly basis. A lactation consultant can teach you how to hand express breast milk, if you prefer not to use a pump.  CARING FOR YOUR BREASTS WHILE YOU BREASTFEED Nipples can become dry, cracked, and sore while breastfeeding. The following recommendations can help keep your breasts moisturized and healthy:  Avoid using soap on your nipples.   Wear a supportive bra. Although not required, special nursing bras and tank tops  are designed to allow access to your breasts for breastfeeding without taking off your entire bra or top. Avoid wearing underwire style bras or extremely tight bras.  Air dry your nipples for 3 75minutes after each feeding.   Use only cotton bra pads to absorb leaked breast milk. Leaking of breast milk between feedings is normal.   Use lanolin on your nipples after breastfeeding. Lanolin helps to maintain your skin's normal moisture barrier. If you use pure lanolin you do not need to wash it off before feeding your baby again. Pure lanolin is not toxic to your baby. You may also hand express a few drops of breast milk and gently massage that milk into your nipples and allow the milk to air dry. In the first few weeks after giving birth, some women experience extremely full breasts (engorgement). Engorgement can make your breasts feel heavy, warm, and tender to the touch. Engorgement peaks within 3 5 days after you give birth. The following recommendations can help ease engorgement:  Completely empty your breasts while breastfeeding or pumping. You may want to start by applying warm, moist heat (in the shower or with warm water-soaked hand towels) just before feeding or pumping. This increases circulation and helps the milk flow. If your baby does not completely empty your breasts while breastfeeding, pump any extra milk after he or she is finished.  Wear a snug bra (nursing or regular) or tank top for 1 2 days to signal your body to slightly decrease milk production.  Apply ice packs to your breasts, unless this is too uncomfortable for you.  Make sure that your baby is latched on and positioned properly while breastfeeding. If engorgement persists after 48 hours of following these recommendations, contact your health care provider or a lactation  Optometrist. OVERALL HEALTH CARE RECOMMENDATIONS WHILE BREASTFEEDING  Eat healthy foods. Alternate between meals and snacks, eating 3 of each per day.  Because what you eat affects your breast milk, some of the foods may make your baby more irritable than usual. Avoid eating these foods if you are sure that they are negatively affecting your baby.  Drink milk, fruit juice, and water to satisfy your thirst (about 10 glasses a day).   Rest often, relax, and continue to take your prenatal vitamins to prevent fatigue, stress, and anemia.  Continue breast self-awareness checks.  Avoid chewing and smoking tobacco.  Avoid alcohol and drug use. Some medicines that may be harmful to your baby can pass through breast milk. It is important to ask your health care provider before taking any medicine, including all over-the-counter and prescription medicine as well as vitamin and herbal supplements. It is possible to become pregnant while breastfeeding. If birth control is desired, ask your health care provider about options that will be safe for your baby. SEEK MEDICAL CARE IF:   You feel like you want to stop breastfeeding or have become frustrated with breastfeeding.  You have painful breasts or nipples.  Your nipples are cracked or bleeding.  Your breasts are red, tender, or warm.  You have a swollen area on either breast.  You have a fever or chills.  You have nausea or vomiting.  You have drainage other than breast milk from your nipples.  Your breasts do not become full before feedings by the 5th day after you give birth.  You feel sad and depressed.  Your baby is too sleepy to eat well.  Your baby is having trouble sleeping.   Your baby is wetting less than 3 diapers in a 24-hour period.  Your baby has less than 3 stools in a 24-hour period.  Your baby's skin or the white part of his or her eyes becomes yellow.   Your baby is not gaining weight by 73 days of age. SEEK IMMEDIATE MEDICAL CARE IF:   Your baby is overly tired (lethargic) and does not want to wake up and feed.  Your baby develops an unexplained  fever. Document Released: 04/02/2005 Document Revised: 12/03/2012 Document Reviewed: 09/24/2012 [facility] Patient Information 2014 [place].

## 2013-05-20 ENCOUNTER — Encounter: Payer: Self-pay | Admitting: Obstetrics & Gynecology

## 2013-05-20 ENCOUNTER — Ambulatory Visit (INDEPENDENT_AMBULATORY_CARE_PROVIDER_SITE_OTHER): Payer: Managed Care, Other (non HMO) | Admitting: Obstetrics & Gynecology

## 2013-05-20 NOTE — Progress Notes (Signed)
Subjective:     Tracy Downs is a 33 y.o. female who presents for a postpartum visit. She is 3 weeks postpartum following a spontaneous vaginal delivery. I have fully reviewed the prenatal and intrapartum course. The delivery was at 42 gestational weeks. Outcome: spontaneous vaginal delivery. Anesthesia: epidural. Postpartum course has been WNL. Baby's course has been WNL. Baby is feeding by bottle - Enfacare. Bleeding staining only. Bowel function is normal. Bladder function is normal. Patient is sexually active. Contraception method is none. Postpartum depression screening: negative.  The following portions of the patient's history were reviewed and updated as appropriate: allergies, current medications, past family history, past medical history, past social history, past surgical history and problem list.  Review of Systems Pertinent items are noted in HPI.   Objective:    BP 113/75  Pulse 74  Temp(Src) 98.1 F (36.7 C)  Ht 4\' 11"  (1.499 m)  Wt 153 lb (69.4 kg)  BMI 30.89 kg/m2  Breastfeeding? No        Assessment:     Doing well postpartum. Plans laparoscopic B/L salpingectomies for sterilization; risks/benefits/alternatives reviewed  Plan:    Follow up postop

## 2013-05-20 NOTE — Patient Instructions (Signed)
Salpingectomy, Care After Refer to this sheet in the next few weeks. These instructions provide you with information on caring for yourself after your procedure. Your health care provider may also give you more specific instructions. Your treatment has been planned according to current medical practices, but problems sometimes occur. Call your health care provider if you have any problems or questions after your procedure. WHAT TO EXPECT AFTER THE PROCEDURE After your procedure, it is typical to have the following:  Abdominal pain that can be controlled with pain medicine.  Vaginal spotting.  Tiredness. HOME CARE INSTRUCTIONS  Get plenty of rest and sleep.  Only take over-the-counter or prescription medicines as directed by your health care provider.  Keep incision areas clean and dry. Remove or change any bandages (dressings) only as directed by your health care provider.  You may resume your regular diet. Eat a well-balanced diet.  Drink enough fluids to keep your urine clear or pale yellow.  Limit exercise and activities as directed by your health care provider. Do not lift anything heavier than 5 lb (2.3 kg) until your health care provider approves.  Do not drive until your health care provider approves.  Do not have sexual intercourse until your health care provider says it is okay.  Take your temperature twice a day for the first week. Write those temperatures down.  Follow up with your health care provider as directed. SEEK MEDICAL CARE IF:  You have pain when you urinate.  You see pus coming out of the incision, or the incision is separating.  You have increasing abdominal pain.  You have swelling or redness in the incision area.  You develop a rash.  You feel lightheaded.  You have pain that is not controlled with medicine. SEEK IMMEDIATE MEDICAL CARE IF:  You develop a fever.  You have increasing abdominal pain.  You develop chest or leg pain.  You  develop shortness of breath.  You pass out. Document Released: 07/07/2010 Document Revised: 01/21/2013 Document Reviewed: 09/24/2012 [facility] Patient Information 2014 [place], [place]. Salpingectomy Salpingectomy, also called tubectomy, is the surgical removal of one of the fallopian tubes. The fallopian tubes are tubes that are connected to the uterus. These tubes transport the egg from the ovary to the uterus. A salpingectomy may be done for various reasons, including:   A tubal (ectopic) pregnancy. This is especially true if the tube ruptures.  An infected fallopian tube.  The need to remove the fallopian tube when removing an ovary with a cyst or tumor.  The need to remove the fallopian tube when removing the uterus.  Cancer of the fallopian tube or nearby organs. Removing one fallopian tube does not prevent you from becoming pregnant. It also does not cause problems with your menstrual periods.  LET [facility] CARE PROVIDER KNOW ABOUT:  Any allergies you have.  All medicines you are taking, including vitamins, herbs, eye drops, creams, and over-the-counter medicines.  Previous problems you or members of your family have had with the use of anesthetics.  Any blood disorders you have.  Previous surgeries you have had.  Medical conditions you have. RISKS AND COMPLICATIONS  Generally, this is a safe procedure. However, as with any procedure, complications can occur. Possible complications include:  Injury to surrounding organs.  Bleeding.  Infection.  Problems related to anesthesia. BEFORE THE PROCEDURE  Ask your health care provider about changing or stopping your regular medicines. You may need to stop taking certain medicines, such as aspirin or blood  thinners, at least 1 week before the surgery.  Do not eat or drink anything for at least 8 hours before the surgery.  If you smoke, do not smoke for at least 2 weeks before the surgery.  Make plans to have someone  drive you home after the procedure or after your hospital stay. Also arrange for someone to help you with activities during recovery. PROCEDURE   You will be given medicine to help you relax before the procedure (sedative). You will then be given medicine to make you sleep through the procedure (general anesthetic). These medicines will be given through an IV access tube that is put into one of your veins.  Once you are asleep, your lower abdomen will be shaved and cleaned. A thin, flexible tube (catheter) will be placed in your bladder.  The surgeon may use a laparoscopic, robotic, or open technique for this surgery:  In the laparoscopic technique, the surgery is done through two small cuts (incisions) in the abdomen. A thin, lighted tube with a tiny camera on the end (laparoscope) is inserted into one of the incisions. The tools needed for the procedure are put through the other incision.  A robotic technique may be chosen to perform complex surgery in a small space. In the robotic technique, small incisions will be made. A camera and surgical instruments are passed through the incisions. Surgical instruments will be controlled with the help of a robotic arm.  In the open technique, the surgery is done through one large incision in the abdomen.  Using any of these techniques, the surgeon removes the fallopian tube from where it attaches to the uterus. The blood vessels will be clamped and tied.  The surgeon then uses staples or stitches to close the incision or incisions. AFTER THE PROCEDURE   You will be taken to a recovery area where your progress will be monitored for 1 3 hours.  If the laparoscopic technique was used, you may be allowed to go home after several hours. You may have some shoulder pain after the laparoscopic procedure. This is normal and usually goes away in a day or two.  If the open technique was used, you will be admitted to the hospital for a couple of days.  You  will be given pain medicine if needed.  The IV access tube and catheter will be removed before you are discharged. Document Released: 08/19/2008 Document Revised: 01/21/2013 Document Reviewed: 09/24/2012 [facility] Patient Information 2014 [place], [place].

## 2013-05-25 ENCOUNTER — Encounter (HOSPITAL_COMMUNITY): Payer: Self-pay | Admitting: Pharmacist

## 2013-05-25 ENCOUNTER — Other Ambulatory Visit: Payer: Self-pay | Admitting: *Deleted

## 2013-05-28 ENCOUNTER — Encounter: Payer: Self-pay | Admitting: Obstetrics & Gynecology

## 2013-05-29 NOTE — Patient Instructions (Signed)
Your procedure is scheduled on: Friday, Feb. 20, 2015  Enter through the [employer] [facility] at: 12:45pm  Pick up the phone at the desk and dial [phone].  Call this number if you have problems the morning of surgery: [phone].  Remember: Do NOT eat food: AFTER MIDNIGHT THURSDAY Do NOT drink clear liquids after: AFTER 10:00AM FRIDAY Take these medicines the morning of surgery with a SIP OF WATER: NONE  Do NOT wear jewelry (body piercing), make-up, or nail polish. Do NOT wear lotions, powders, or perfumes.  You may wear deoderant. Do NOT shave for 48 hours prior to surgery. Do NOT bring valuables to the hospital. Contacts, dentures, or bridgework may not be worn into surgery. Have a responsible adult drive you home and stay with you for 24 hours after your procedure

## 2013-06-01 ENCOUNTER — Encounter (HOSPITAL_COMMUNITY): Payer: Self-pay

## 2013-06-01 ENCOUNTER — Encounter (HOSPITAL_COMMUNITY)
Admission: RE | Admit: 2013-06-01 | Discharge: 2013-06-01 | Disposition: A | Discharge status: Home / Self Care | Payer: Managed Care, Other (non HMO) | Source: Ambulatory Visit | Attending: Obstetrics & Gynecology | Admitting: Obstetrics & Gynecology

## 2013-06-01 DIAGNOSIS — Z01812 Encounter for preprocedural laboratory examination: Secondary | ICD-10-CM | POA: Insufficient documentation

## 2013-06-01 LAB — CBC
HEMATOCRIT: 46.7 % — AB (ref 36.0–46.0)
HEMOGLOBIN: 15.5 g/dL — AB (ref 12.0–15.0)
MCH: 30.4 pg (ref 26.0–34.0)
MCHC: 33.2 g/dL (ref 30.0–36.0)
MCV: 91.6 fL (ref 78.0–100.0)
Platelets: 168 10*3/uL (ref 150–400)
RBC: 5.1 MIL/uL (ref 3.87–5.11)
RDW: 13.2 % (ref 11.5–15.5)
WBC: 6.9 10*3/uL (ref 4.0–10.5)

## 2013-06-01 NOTE — Pre-Procedure Instructions (Signed)
Dr. Josephine Igo made aware of pts. 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  Pt denies any pain at this time.  No new orders received at this time.

## 2013-06-05 ENCOUNTER — Encounter (HOSPITAL_COMMUNITY)
Admission: RE | Disposition: A | Discharge status: Home / Self Care | Payer: Self-pay | Source: Ambulatory Visit | Attending: Obstetrics & Gynecology

## 2013-06-05 ENCOUNTER — Telehealth: Payer: Self-pay | Admitting: Oncology

## 2013-06-05 ENCOUNTER — Encounter (HOSPITAL_COMMUNITY): Payer: Self-pay | Admitting: Certified Registered"

## 2013-06-05 ENCOUNTER — Ambulatory Visit (HOSPITAL_COMMUNITY)
Admission: RE | Admit: 2013-06-05 | Discharge: 2013-06-05 | Disposition: A | Discharge status: Home / Self Care | Payer: Managed Care, Other (non HMO) | Source: Ambulatory Visit | Attending: Obstetrics & Gynecology | Admitting: Obstetrics & Gynecology

## 2013-06-05 ENCOUNTER — Encounter (HOSPITAL_COMMUNITY): Payer: Managed Care, Other (non HMO) | Admitting: Certified Registered"

## 2013-06-05 ENCOUNTER — Ambulatory Visit (HOSPITAL_COMMUNITY): Payer: Managed Care, Other (non HMO) | Admitting: Certified Registered"

## 2013-06-05 DIAGNOSIS — Z302 Encounter for sterilization: Secondary | ICD-10-CM | POA: Insufficient documentation

## 2013-06-05 DIAGNOSIS — I10 Essential (primary) hypertension: Secondary | ICD-10-CM | POA: Insufficient documentation

## 2013-06-05 DIAGNOSIS — Z641 Problems related to multiparity: Secondary | ICD-10-CM | POA: Insufficient documentation

## 2013-06-05 DIAGNOSIS — E669 Obesity, unspecified: Secondary | ICD-10-CM | POA: Insufficient documentation

## 2013-06-05 DIAGNOSIS — D759 Disease of blood and blood-forming organs, unspecified: Secondary | ICD-10-CM | POA: Insufficient documentation

## 2013-06-05 HISTORY — PX: LAPAROSCOPIC BILATERAL SALPINGECTOMY: SHX5889

## 2013-06-05 LAB — PREGNANCY, URINE: PREG TEST UR: NEGATIVE

## 2013-06-05 SURGERY — SALPINGECTOMY, BILATERAL, LAPAROSCOPIC
Anesthesia: General | Laterality: Bilateral

## 2013-06-05 MED ORDER — ONDANSETRON HCL 4 MG/2ML IJ SOLN
INTRAMUSCULAR | Status: AC
  Filled 2013-06-05: qty 2

## 2013-06-05 MED ORDER — SODIUM CHLORIDE 0.9 % IJ SOLN: 3.0000 mL | INTRAMUSCULAR | Status: DC | PRN

## 2013-06-05 MED ORDER — DEXAMETHASONE SODIUM PHOSPHATE 10 MG/ML IJ SOLN
INTRAMUSCULAR | Status: DC | PRN
  Administered 2013-06-05: 10 mg via INTRAVENOUS

## 2013-06-05 MED ORDER — MIDAZOLAM HCL 2 MG/2ML IJ SOLN
INTRAMUSCULAR | Status: AC
  Filled 2013-06-05: qty 2

## 2013-06-05 MED ORDER — SODIUM CHLORIDE 0.9 % IV SOLN: 250.0000 mL | INTRAVENOUS | Status: DC | PRN

## 2013-06-05 MED ORDER — ROCURONIUM BROMIDE 100 MG/10ML IV SOLN
INTRAVENOUS | Status: DC | PRN
  Administered 2013-06-05: 40 mg via INTRAVENOUS

## 2013-06-05 MED ORDER — MEPERIDINE HCL 25 MG/ML IJ SOLN: 6.2500 mg | INTRAMUSCULAR | Status: DC | PRN

## 2013-06-05 MED ORDER — KETOROLAC TROMETHAMINE 30 MG/ML IJ SOLN
INTRAMUSCULAR | Status: DC | PRN
  Administered 2013-06-05: 30 mg via INTRAVENOUS

## 2013-06-05 MED ORDER — BUPIVACAINE HCL (PF) 0.25 % IJ SOLN
INTRAMUSCULAR | Status: AC
  Filled 2013-06-05: qty 30

## 2013-06-05 MED ORDER — FENTANYL CITRATE 0.05 MG/ML IJ SOLN
INTRAMUSCULAR | Status: DC | PRN
  Administered 2013-06-05 (×5): 50 ug via INTRAVENOUS

## 2013-06-05 MED ORDER — NEOSTIGMINE METHYLSULFATE 1 MG/ML IJ SOLN
INTRAMUSCULAR | Status: DC | PRN
  Administered 2013-06-05: 4 mg via INTRAVENOUS
  Administered 2013-06-05: 1 mg via INTRAVENOUS

## 2013-06-05 MED ORDER — FENTANYL CITRATE 0.05 MG/ML IJ SOLN
INTRAMUSCULAR | Status: AC
  Filled 2013-06-05: qty 5

## 2013-06-05 MED ORDER — ACETAMINOPHEN 650 MG RE SUPP
650.0000 mg | RECTAL | Status: DC | PRN
  Filled 2013-06-05: qty 1

## 2013-06-05 MED ORDER — HEPARIN SODIUM (PORCINE) 5000 UNIT/ML IJ SOLN
INTRAMUSCULAR | Status: AC
  Filled 2013-06-05: qty 1

## 2013-06-05 MED ORDER — MIDAZOLAM HCL 2 MG/2ML IJ SOLN
INTRAMUSCULAR | Status: DC | PRN
  Administered 2013-06-05: 2 mg via INTRAVENOUS

## 2013-06-05 MED ORDER — DEXAMETHASONE SODIUM PHOSPHATE 10 MG/ML IJ SOLN
INTRAMUSCULAR | Status: AC
  Filled 2013-06-05: qty 1

## 2013-06-05 MED ORDER — KETOROLAC TROMETHAMINE 30 MG/ML IJ SOLN
INTRAMUSCULAR | Status: AC
  Filled 2013-06-05: qty 1

## 2013-06-05 MED ORDER — ACETAMINOPHEN 325 MG PO TABS: 650.0000 mg | ORAL_TABLET | ORAL | Status: DC | PRN

## 2013-06-05 MED ORDER — TRAMADOL HCL 50 MG PO TABS: 50.0000 mg | ORAL_TABLET | Freq: Four times a day (QID) | ORAL | Status: DC | PRN

## 2013-06-05 MED ORDER — SODIUM CHLORIDE 0.9 % IJ SOLN
3.0000 mL | Freq: Two times a day (BID) | INTRAMUSCULAR | Status: DC
Start: 2013-06-05 — End: 2013-06-05

## 2013-06-05 MED ORDER — METOCLOPRAMIDE HCL 5 MG/ML IJ SOLN: 10.0000 mg | Freq: Once | INTRAMUSCULAR | Status: DC | PRN

## 2013-06-05 MED ORDER — LIDOCAINE HCL (CARDIAC) 20 MG/ML IV SOLN
INTRAVENOUS | Status: AC
  Filled 2013-06-05: qty 5

## 2013-06-05 MED ORDER — ONDANSETRON HCL 4 MG/2ML IJ SOLN: 4.0000 mg | Freq: Four times a day (QID) | INTRAMUSCULAR | Status: DC | PRN

## 2013-06-05 MED ORDER — BUPIVACAINE HCL (PF) 0.25 % IJ SOLN
INTRAMUSCULAR | Status: DC | PRN
  Administered 2013-06-05: 9 mL

## 2013-06-05 MED ORDER — FENTANYL CITRATE 0.05 MG/ML IJ SOLN: 25.0000 ug | INTRAMUSCULAR | Status: DC | PRN

## 2013-06-05 MED ORDER — LIDOCAINE HCL (CARDIAC) 20 MG/ML IV SOLN
INTRAVENOUS | Status: DC | PRN
  Administered 2013-06-05: 80 mg via INTRAVENOUS

## 2013-06-05 MED ORDER — LACTATED RINGERS IV SOLN
INTRAVENOUS | Status: DC
  Administered 2013-06-05 (×2): via INTRAVENOUS

## 2013-06-05 MED ORDER — GLYCOPYRROLATE 0.2 MG/ML IJ SOLN
INTRAMUSCULAR | Status: AC
  Filled 2013-06-05: qty 3

## 2013-06-05 MED ORDER — NEOSTIGMINE METHYLSULFATE 1 MG/ML IJ SOLN
INTRAMUSCULAR | Status: AC
  Filled 2013-06-05: qty 1

## 2013-06-05 MED ORDER — METHYLENE BLUE 1 % INJ SOLN
INTRAMUSCULAR | Status: AC
  Filled 2013-06-05: qty 10

## 2013-06-05 MED ORDER — ROCURONIUM BROMIDE 100 MG/10ML IV SOLN
INTRAVENOUS | Status: AC
  Filled 2013-06-05: qty 1

## 2013-06-05 MED ORDER — PROPOFOL 10 MG/ML IV EMUL
INTRAVENOUS | Status: AC
  Filled 2013-06-05: qty 20

## 2013-06-05 MED ORDER — ONDANSETRON HCL 4 MG/2ML IJ SOLN
INTRAMUSCULAR | Status: DC | PRN
  Administered 2013-06-05: 4 mg via INTRAVENOUS

## 2013-06-05 MED ORDER — OXYCODONE HCL 5 MG PO TABS
5.0000 mg | ORAL_TABLET | ORAL | Status: DC | PRN
  Filled 2013-06-05: qty 2

## 2013-06-05 MED ORDER — GLYCOPYRROLATE 0.2 MG/ML IJ SOLN
INTRAMUSCULAR | Status: DC | PRN
  Administered 2013-06-05: .3 mg via INTRAVENOUS
  Administered 2013-06-05: .7 mg via INTRAVENOUS

## 2013-06-05 MED ORDER — PROPOFOL 10 MG/ML IV BOLUS
INTRAVENOUS | Status: DC | PRN
  Administered 2013-06-05: 180 mg via INTRAVENOUS

## 2013-06-05 SURGICAL SUPPLY — 32 items
APL SKNCLS STERI-STRIP NONHPOA (GAUZE/BANDAGES/DRESSINGS) ×1
BAG SPEC RTRVL LRG 6X4 10 (ENDOMECHANICALS)
BENZOIN TINCTURE PRP APPL 2/3 (GAUZE/BANDAGES/DRESSINGS) ×3 IMPLANT
CABLE HIGH FREQUENCY MONO STRZ (ELECTRODE) IMPLANT
CATH ROBINSON RED A/P 16FR (CATHETERS) ×3 IMPLANT
CHLORAPREP W/TINT 26ML (MISCELLANEOUS) ×3 IMPLANT
CLOSURE WOUND 1/2 X4 (GAUZE/BANDAGES/DRESSINGS) ×1
CLOTH BEACON ORANGE TIMEOUT ST (SAFETY) ×3 IMPLANT
CONT SPECI 4OZ STER CLIK (MISCELLANEOUS) IMPLANT
EVACUATOR SMOKE 8.L (FILTER) ×3 IMPLANT
GLOVE BIO SURGEON STRL SZ 6.5 (GLOVE) ×4 IMPLANT
GLOVE BIO SURGEONS STRL SZ 6.5 (GLOVE) ×2
GOWN STRL REUS W/TWL LRG LVL3 (GOWN DISPOSABLE) ×6 IMPLANT
NS IRRIG 1000ML POUR BTL (IV SOLUTION) ×3 IMPLANT
PACK LAPAROSCOPY BASIN (CUSTOM PROCEDURE TRAY) ×3 IMPLANT
POUCH SPECIMEN RETRIEVAL 10MM (ENDOMECHANICALS) IMPLANT
PROTECTOR NERVE ULNAR (MISCELLANEOUS) ×3 IMPLANT
SCRUB PCMX 4 OZ (MISCELLANEOUS) ×3 IMPLANT
SEALER TISSUE G2 CVD JAW 35 (ENDOMECHANICALS) ×1 IMPLANT
SEALER TISSUE G2 CVD JAW 45CM (ENDOMECHANICALS) ×2
SET IRRIG TUBING LAPAROSCOPIC (IRRIGATION / IRRIGATOR) IMPLANT
STRIP CLOSURE SKIN 1/2X4 (GAUZE/BANDAGES/DRESSINGS) ×2 IMPLANT
SUT MNCRL AB 4-0 PS2 18 (SUTURE) IMPLANT
SUT VICRYL 0 UR6 27IN ABS (SUTURE) ×3 IMPLANT
SUT VICRYL 4-0 PS2 18IN ABS (SUTURE) IMPLANT
SUT VICRYL RAPIDE 4/0 PS 2 (SUTURE) ×2 IMPLANT
TOWEL OR 17X24 6PK STRL BLUE (TOWEL DISPOSABLE) ×6 IMPLANT
TRAY FOLEY CATH 14FR (SET/KITS/TRAYS/PACK) IMPLANT
TROCAR XCEL NON-BLD 11X100MML (ENDOMECHANICALS) ×3 IMPLANT
TROCAR XCEL NON-BLD 5MMX100MML (ENDOMECHANICALS) ×9 IMPLANT
WARMER LAPAROSCOPE (MISCELLANEOUS) ×3 IMPLANT
WATER STERILE IRR 1000ML POUR (IV SOLUTION) ×3 IMPLANT

## 2013-06-05 NOTE — Op Note (Signed)
Procedure Note  Tracy Downs 33 y.o. 06/05/2013  Preoperative Diagnosis:  Multiparity, desires a sterilization procedure  Postoperative Diagnosis: Same  Procedure: Laparoscopic bilateral salpingectomies  Surgeon: Lahoma Crocker A  Indications:  The patient now presents for a sterilization procedure after discussing therapeutic alternatives.        Procedure Detail:  The patient was taken to the operating room and was placed on the operating table in the dorsal supine position.  After his satisfactory general anesthesia was achieved, the patient was placed in the semi-lithotomy position using Allen stirrups. The patient was prepped and draped in the usual sterile manner for vaginal laparoscopic procedure. A speculum was placed in the vagina. The anterior lip of the cervix was grasped with a single-tooth tenaculum. A Hulka manipulator was then advanced into the uterus and secured  to the anterior lip of the cervix as a means to manipulate the uterus. The single-tooth tenaculum and Hulka manipulator were then removed. The infraumbilical region was then anesthetized with local anesthesia, 0.25%  Marcaine. A small incision was made to the skin and subcutaneous tissue. A 5 mm Optiview trocar was placed through the incision into the abdominal cavity diagnostic laparoscope with video camera attached was placed through the trocar sleeve and carbon dioxide was used to insufflate the abdominal and pelvic cavity. The pelvic contents were examined and the findings were described below. A 10 mm suprapubic port and 5 mm left lower quadrant port were placed under direct visualization. The left fallopian tube was identified and traced out to its fimbriated end. The proximal fallopian tube coagulated and transected the Enseal device.  The mesosalpinx was coagulated and transected with the Enseal device.The right fallopian tube was manipulated in a similar fashion.  Adequate hemostasis was noted.  The tubes were  removed from the abdomen.  The ports were removed.  The scope was removed and the excess carbon dioxide was remove through the umbilical port, before it was removed.  The fascial layer of the suprapubic incision was reapproximated with an interrupted figure-of eight 0-Vicryl suture on a UR 6 needle. The skin incisions were reapproximated with running subcuticluar stitches of 4-0 Vicryl suture. The instruments were removed from the vagina and there was minimal bleeding from the cervix. Final sponge, instrument and needle counts were correct. The patient was awakened on the operating table and taken to the PACU in satisfactory condition.    Findings: Normal uterus, tubes and ovaries  Estimated Blood Loss:  Minimal              Total IV Fluids: per Anesthesiology        Condition: stable

## 2013-06-05 NOTE — Discharge Instructions (Signed)
Salpingectomy, Care After Refer to this sheet in the next few weeks. These instructions provide you with information on caring for yourself after your procedure. Your health care provider may also give you more specific instructions. Your treatment has been planned according to current medical practices, but problems sometimes occur. Call your health care provider if you have any problems or questions after your procedure. WHAT TO EXPECT AFTER THE PROCEDURE After your procedure, it is typical to have the following:  Abdominal pain that can be controlled with pain medicine.  Vaginal spotting.  Tiredness. HOME CARE INSTRUCTIONS  Get plenty of rest and sleep.  Only take over-the-counter or prescription medicines as directed by your health care provider.  Keep incision areas clean and dry. Remove or change any bandages (dressings) only as directed by your health care provider.  You may resume your regular diet. Eat a well-balanced diet.  Drink enough fluids to keep your urine clear or pale yellow.  Limit exercise and activities as directed by your health care provider. Do not lift anything heavier than 5 lb (2.3 kg) until your health care provider approves.  Do not drive until your health care provider approves.  Do not have sexual intercourse until your health care provider says it is okay.  Take your temperature twice a day for the first week. Write those temperatures down.  Follow up with your health care provider as directed. SEEK MEDICAL CARE IF:  You have pain when you urinate.  You see pus coming out of the incision, or the incision is separating.  You have increasing abdominal pain.  You have swelling or redness in the incision area.  You develop a rash.  You feel lightheaded.  You have pain that is not controlled with medicine. SEEK IMMEDIATE MEDICAL CARE IF:  You develop a fever.  You have increasing abdominal pain.  You develop chest or leg pain.  You  develop shortness of breath.  You pass out. Document Released: 07/07/2010 Document Revised: 01/21/2013 Document Reviewed: 09/24/2012 [facility] Patient Information 2014 [place], [place].

## 2013-06-05 NOTE — Preoperative (Signed)
Beta Blockers   Reason not to administer Beta Blockers:Not Applicable 

## 2013-06-05 NOTE — Transfer of Care (Signed)
Immediate Anesthesia Transfer of Care Note  Patient: Tracy Downs  Procedure(s) Performed: Procedure(s): LAPAROSCOPIC BILATERAL SALPINGECTOMY (Bilateral)  Patient Location: PACU  Anesthesia Type:General  Level of Consciousness: awake, alert  and oriented  Airway & Oxygen Therapy: Patient Spontanous Breathing and Patient connected to nasal cannula oxygen  Post-op Assessment: Report given to PACU RN, Post -op Vital signs reviewed and stable and Patient moving all extremities  Post vital signs: Reviewed and stable  Complications: No apparent anesthesia complications

## 2013-06-05 NOTE — Anesthesia Preprocedure Evaluation (Signed)
Anesthesia Evaluation  Patient identified by MRN, date of birth, ID band Patient awake    Reviewed: Allergy & Precautions, H&P , NPO status , Patient's Chart, lab work & pertinent test results  Airway Mallampati: III      Dental no notable dental hx. (+) Teeth Intact   Pulmonary  Mediastinal lymphadenopathy breath sounds clear to auscultation  Pulmonary exam normal       Cardiovascular hypertension, Rhythm:Regular Rate:Normal  Hx/o PIH   Neuro/Psych negative neurological ROS  negative psych ROS   GI/Hepatic negative GI ROS, Neg liver ROS,   Endo/Other  negative endocrine ROS  Renal/GU negative Renal ROS  negative genitourinary   Musculoskeletal negative musculoskeletal ROS (+)   Abdominal (+) + obese,   Peds  Hematology  (+) Blood dyscrasia, , Castleman's disease   Anesthesia Other Findings   Reproductive/Obstetrics Undesired fertility                           Anesthesia Physical Anesthesia Plan  ASA: II  Anesthesia Plan: General   Post-op Pain Management:    Induction: Intravenous  Airway Management Planned: Oral ETT  Additional Equipment:   Intra-op Plan:   Post-operative Plan: Extubation in OR  Informed Consent: I have reviewed the patients History and Physical, chart, labs and discussed the procedure including the risks, benefits and alternatives for the proposed anesthesia with the patient or authorized representative who has indicated his/her understanding and acceptance.   Dental advisory given  Plan Discussed with: CRNA, Anesthesiologist and Surgeon  Anesthesia Plan Comments:         Anesthesia Quick Evaluation

## 2013-06-05 NOTE — Anesthesia Postprocedure Evaluation (Signed)
  Anesthesia Post-op Note  Patient: Tracy Downs  Procedure(s) Performed: Procedure(s): LAPAROSCOPIC BILATERAL SALPINGECTOMY (Bilateral)  Patient Location: PACU  Anesthesia Type:General  Level of Consciousness: awake, alert  and oriented  Airway and Oxygen Therapy: Patient Spontanous Breathing  Post-op Pain: mild  Post-op Assessment: Post-op Vital signs reviewed, Patient's Cardiovascular Status Stable, Respiratory Function Stable, Patent Airway, No signs of Nausea or vomiting and Pain level controlled  Post-op Vital Signs: Reviewed and stable  Complications: No apparent anesthesia complications

## 2013-06-05 NOTE — Telephone Encounter (Signed)
Talked to pt, r/s appt due to MD PAL to [facility] , pt aware

## 2013-06-05 NOTE — H&P (Signed)
  Chief Complaint: 33 y.o.  who presents with for a sterilization procedure  Details of Present Illness: See above.  Ht 4\' 11"  (1.499 m)  Wt 152 lb (68.947 kg)  BMI 30.68 kg/m2  Past Medical History  Diagnosis Date  . Pre-eclampsia   . Deafness in left ear    History   Social History  . Marital Status: Married    Spouse Name: N/A    Number of Children: N/A  . Years of Education: N/A   Occupational History  . Not on file.   Social History Main Topics  . Smoking status: Never Smoker   . Smokeless tobacco: Never Used  . Alcohol Use: Yes     Comment: occasionally   . Drug Use: No  . Sexual Activity: Yes    Partners: Male    Birth Control/ Protection: None   Other Topics Concern  . Not on file   Social History Narrative  . No narrative on file   Family History  Problem Relation Age of Onset  . Cancer Mother     breast  . Kidney disease Brother   . Diabetes Maternal Grandmother   . Hypertension Maternal Grandmother     Pertinent items are noted in HPI.  Pre-Op Diagnosis: desires Sterilization   Planned Procedure: Procedure(s): LAPAROSCOPIC BILATERAL SALPINGECTOMY  I have reviewed the patient's history and have completed the physical exam and Tracy Downs is acceptable for surgery.  Agnes Lawrence, MD 06/05/2013 9:56 AM

## 2013-06-08 ENCOUNTER — Encounter (HOSPITAL_COMMUNITY): Payer: Self-pay | Admitting: Obstetrics & Gynecology

## 2013-06-25 ENCOUNTER — Ambulatory Visit: Payer: Managed Care, Other (non HMO) | Admitting: Oncology

## 2013-07-03 ENCOUNTER — Telehealth: Payer: Self-pay | Admitting: Oncology

## 2013-07-03 ENCOUNTER — Ambulatory Visit (HOSPITAL_BASED_OUTPATIENT_CLINIC_OR_DEPARTMENT_OTHER): Payer: Managed Care, Other (non HMO) | Admitting: Oncology

## 2013-07-03 VITALS — BP 116/80 | HR 78 | Temp 98.0°F | Resp 18 | Ht 59.0 in | Wt 156.2 lb

## 2013-07-03 DIAGNOSIS — D47Z2 Castleman disease: Secondary | ICD-10-CM

## 2013-07-03 DIAGNOSIS — R599 Enlarged lymph nodes, unspecified: Secondary | ICD-10-CM

## 2013-07-03 NOTE — Progress Notes (Signed)
   [place]    OFFICE PROGRESS NOTE   INTERVAL HISTORY:   She returns as scheduled. She delivered a child in January. She reports the delivery went well and the baby is doing fine. She underwent bilateral salpingectomy is on 06/05/2013.  Ms. Heimsoth feels well. No fever, night sweats, chest pain, dyspnea, or cough.  Objective:  Vital signs in last 24 hours:  Blood pressure 116/80, pulse 78, temperature 98 F (36.7 C), temperature source Oral, resp. rate 18, height 4\' 11"  (1.499 m), weight 156 lb 3.2 oz (70.852 kg), not currently breastfeeding.    HEENT: Neck without mass Lymphatics: 2 cm right anterior cervical node, no supraclavicular, axillary, or inguinal nodes Resp: Lungs clear bilaterally Cardio: Regular rate and rhythm GI: No hepatosplenomegaly Vascular: No leg edema     Lab Results:  Lab Results  Component Value Date   WBC 6.9 06/01/2013   HGB 15.5* 06/01/2013   HCT 46.7* 06/01/2013   MCV 91.6 06/01/2013   PLT 168 06/01/2013   NEUTROABS 8.8* 10/08/2012      Medications: I have reviewed the patient's current medications.  Assessment/Plan: 1. Castleman's disease. A CT of the chest 06/09/2009 confirmed a right paratracheal mass. A mediastinoscopy 06/21/2009 with biopsy of the mediastinal mass confirmed Castleman's disease. Staging CTs of the chest, abdomen and pelvis 08/12/2009 confirmed a right paratracheal mass, a superior mediastinal lymph node and a left cervical/supraclavicular lymph node. There was no evidence of Castleman's disease in the abdomen or pelvis. A CT on 11/07/2009 was stable. 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 2. G2 P2, status post delivery of her second child in January 2015 3. Bilateral salpingectomy 06/05/2013 4. Right anterior cervical lymph node 07/03/2013  Disposition:  She appears to have a new right cervical node. This may be related to Castleman's disease. She will be referred for a restaging CT  evaluation within the next one week.  Ms. Karstens will return for an office visit in 2 months. We will see her sooner pending the CT findings.   Betsy Coder, MD  07/03/2013  8:43 AM

## 2013-07-03 NOTE — Telephone Encounter (Signed)
gv and printed aptp sched adn avs forpt for March and June

## 2013-07-10 ENCOUNTER — Other Ambulatory Visit: Payer: Managed Care, Other (non HMO)

## 2013-07-10 ENCOUNTER — Ambulatory Visit (HOSPITAL_COMMUNITY)
Admission: RE | Admit: 2013-07-10 | Discharge: 2013-07-10 | Disposition: A | Discharge status: Home / Self Care | Payer: Managed Care, Other (non HMO) | Source: Ambulatory Visit | Attending: Oncology | Admitting: Oncology

## 2013-07-10 ENCOUNTER — Encounter (HOSPITAL_COMMUNITY): Payer: Self-pay

## 2013-07-10 DIAGNOSIS — R599 Enlarged lymph nodes, unspecified: Secondary | ICD-10-CM | POA: Insufficient documentation

## 2013-07-10 DIAGNOSIS — D47Z2 Castleman disease: Secondary | ICD-10-CM

## 2013-07-10 LAB — CBC WITH DIFFERENTIAL/PLATELET
BASO%: 0.7 % (ref 0.0–2.0)
Basophils Absolute: 0.1 10*3/uL (ref 0.0–0.1)
EOS%: 1.3 % (ref 0.0–7.0)
Eosinophils Absolute: 0.1 10*3/uL (ref 0.0–0.5)
HEMATOCRIT: 46.7 % — AB (ref 34.8–46.6)
HGB: 15.5 g/dL (ref 11.6–15.9)
LYMPH%: 31.3 % (ref 14.0–49.7)
MCH: 30.3 pg (ref 25.1–34.0)
MCHC: 33.1 g/dL (ref 31.5–36.0)
MCV: 91.6 fL (ref 79.5–101.0)
MONO#: 0.5 10*3/uL (ref 0.1–0.9)
MONO%: 6.7 % (ref 0.0–14.0)
NEUT#: 4.5 10*3/uL (ref 1.5–6.5)
NEUT%: 60 % (ref 38.4–76.8)
NRBC: 37 % — AB (ref 0–0)
PLATELETS: 245 10*3/uL (ref 145–400)
RBC: 5.09 10*6/uL (ref 3.70–5.45)
RDW: 13.7 % (ref 11.2–14.5)
WBC: 7.5 10*3/uL (ref 3.9–10.3)
lymph#: 2.3 10*3/uL (ref 0.9–3.3)

## 2013-07-10 LAB — COMPREHENSIVE METABOLIC PANEL (CC13)
ALK PHOS: 87 U/L (ref 40–150)
ALT: 33 U/L (ref 0–55)
AST: 23 U/L (ref 5–34)
Albumin: 4.2 g/dL (ref 3.5–5.0)
Anion Gap: 11 mEq/L (ref 3–11)
BUN: 13.8 mg/dL (ref 7.0–26.0)
CO2: 24 mEq/L (ref 22–29)
CREATININE: 0.9 mg/dL (ref 0.6–1.1)
Calcium: 9.7 mg/dL (ref 8.4–10.4)
Chloride: 106 mEq/L (ref 98–109)
Glucose: 92 mg/dl (ref 70–140)
Potassium: 4.2 mEq/L (ref 3.5–5.1)
Sodium: 140 mEq/L (ref 136–145)
Total Bilirubin: 0.77 mg/dL (ref 0.20–1.20)
Total Protein: 7.3 g/dL (ref 6.4–8.3)

## 2013-07-10 LAB — LACTATE DEHYDROGENASE (CC13): LDH: 136 U/L (ref 125–245)

## 2013-07-10 MED ORDER — IOHEXOL 300 MG/ML  SOLN
100.0000 mL | Freq: Once | INTRAMUSCULAR | Status: AC | PRN
  Administered 2013-07-10: 100 mL via INTRAVENOUS

## 2013-07-15 ENCOUNTER — Telehealth: Payer: Self-pay | Admitting: *Deleted

## 2013-07-15 ENCOUNTER — Encounter: Payer: Self-pay | Admitting: Obstetrics & Gynecology

## 2013-07-15 ENCOUNTER — Ambulatory Visit (INDEPENDENT_AMBULATORY_CARE_PROVIDER_SITE_OTHER): Payer: Managed Care, Other (non HMO) | Admitting: Obstetrics & Gynecology

## 2013-07-15 ENCOUNTER — Telehealth: Payer: Self-pay | Admitting: Oncology

## 2013-07-15 VITALS — BP 125/77 | HR 89 | Temp 97.8°F | Ht 59.0 in | Wt 153.0 lb

## 2013-07-15 DIAGNOSIS — Z0000 Encounter for general adult medical examination without abnormal findings: Secondary | ICD-10-CM

## 2013-07-15 DIAGNOSIS — Z09 Encounter for follow-up examination after completed treatment for conditions other than malignant neoplasm: Secondary | ICD-10-CM

## 2013-07-15 LAB — POCT URINALYSIS DIPSTICK
Blood, UA: NEGATIVE
Glucose, UA: NEGATIVE
KETONES UA: NEGATIVE
Leukocytes, UA: NEGATIVE
Nitrite, UA: NEGATIVE
Protein, UA: NEGATIVE
SPEC GRAV UA: 1.02
pH, UA: 5

## 2013-07-15 NOTE — Progress Notes (Signed)
Subjective:     Tracy Downs is a 33 y.o. female here for a postop check.  Current complaints: pt has no complaints today.  Personal health questionnaire reviewed: yes.   Gynecologic History No LMP recorded. Contraception: tubal ligation Last Pap: 04/2012. Results were: normal Last mammogram: n/a  Obstetric History OB History  Gravida Para Term Preterm AB SAB TAB Ectopic Multiple Living  2 2 2       2     # Outcome Date GA Lbr Len/2nd Weight Sex Delivery Anes PTL Lv  2 TRM 04/30/13 [redacted]w[redacted]d 05:20 / 01:46 5 lb 9.6 oz (2.54 kg) M SVD EPI  Y  1 TRM 11/20/05 [redacted]w[redacted]d  6 lb (2.722 kg) M SVD EPI  Y     Comments: Pre-eclampsia, had sepsis post partum       The following portions of the patient's history were reviewed and updated as appropriate: allergies, current medications, past family history, past medical history, past social history, past surgical history and problem list.  Review of Systems Pertinent items are noted in HPI.    Objective:      General:  alert     Abdomen: soft, non-tender; bowel sounds normal; no masses,  no organomegaly   Vulva:  normal  Vagina: normal vagina  Cervix:  no lesions  Corpus: normal size, contour, position, consistency, mobility, non-tender  Adnexa:  normal adnexa       50% of 15 min visit spent on counseling and coordination of care.   Assessment:    Normal postop exam.    Plan:   Return prn Referral-->primary care provider

## 2013-07-15 NOTE — Telephone Encounter (Signed)
per pof appt sched and pt aware

## 2013-07-15 NOTE — Telephone Encounter (Signed)
Message copied by Brien Few on Wed Jul 15, 2013 11:20 AM ------      Message from: Ladell Pier      Created: Tue Jul 14, 2013  5:42 PM       Please call patient, chest adenopathy is stable, enlarged neck node on CT consistent with progression of Castleman's, f/u next 2-3 weeks with Lattie Haw to discuss need for Rituxan, call for new symptoms ------

## 2013-07-15 NOTE — Telephone Encounter (Signed)
Called pt with CT results. Enlarged lymph node consistent with progression of Castleman's. Dr. Benay Spice recommends office visit next 2-3 weeks to discuss treatment options. Pt denies any difficulty breathing/ swallowing. Instructed her to call office for new symptoms. Appt given for 3/21 at 3:15.

## 2013-08-04 ENCOUNTER — Ambulatory Visit (HOSPITAL_BASED_OUTPATIENT_CLINIC_OR_DEPARTMENT_OTHER): Payer: Managed Care, Other (non HMO) | Admitting: Nurse Practitioner

## 2013-08-04 ENCOUNTER — Telehealth: Payer: Self-pay | Admitting: Internal Medicine

## 2013-08-04 ENCOUNTER — Telehealth: Payer: Self-pay | Admitting: Oncology

## 2013-08-04 VITALS — BP 114/76 | HR 90 | Temp 97.4°F | Resp 18 | Ht 59.0 in | Wt 154.1 lb

## 2013-08-04 DIAGNOSIS — R599 Enlarged lymph nodes, unspecified: Secondary | ICD-10-CM

## 2013-08-04 DIAGNOSIS — D47Z2 Castleman disease: Secondary | ICD-10-CM

## 2013-08-04 NOTE — Progress Notes (Addendum)
  [place] OFFICE PROGRESS NOTE   Diagnosis:  Castleman's disease.  INTERVAL HISTORY:   She feels well. No fevers or sweats. No neck pain. No dysphagia. She denies shortness of breath.  Objective:  Vital signs in last 24 hours:  Blood pressure 114/76, pulse 90, temperature 97.4 F (36.3 C), temperature source Oral, resp. rate 18, height 4\' 11"  (1.499 m), weight 154 lb 1.6 oz (69.899 kg), SpO2 100.00%, not currently breastfeeding.    HEENT: No thrush or ulcerations. Lymphatics: 2 cm high right anterior cervical node. No supraclavicular lymph nodes. Resp: Lungs clear. Cardio: Regular cardiac rhythm. GI: Abdomen soft and nontender. No organomegaly. Vascular: No leg edema.   Lab Results:  Lab Results  Component Value Date   WBC 7.5 07/10/2013   HGB 15.5 07/10/2013   HCT 46.7* 07/10/2013   MCV 91.6 07/10/2013   PLT 245 07/10/2013   NEUTROABS 4.5 07/10/2013    Imaging:  No results found.  Medications: I have reviewed the patient's current medications.  Assessment/Plan: 1. Castleman's disease. A CT of the chest 06/09/2009 confirmed a right paratracheal mass. A mediastinoscopy 06/21/2009 with biopsy of the mediastinal mass confirmed Castleman's disease. Staging CTs of the chest, abdomen and pelvis 08/12/2009 confirmed a right paratracheal mass, a superior mediastinal lymph node and a left cervical/supraclavicular lymph node. There was no evidence of Castleman's disease in the abdomen or pelvis. A CT on 11/07/2009 was stable.  2. G2 P2, status post delivery of her second child in January 2015 3. Bilateral salpingectomy 06/05/2013 4. Right anterior cervical lymph node on exam 07/03/2013. 5. CT neck and chest 07/10/2013. Marked worsening of a single cervical level II lymph node on the right measuring 20 x 22 mm, hyperenhancing (previous measurement of 6 x 9 mm 11/07/2009); no significant change in paratracheal lymphadenopathy.   Disposition: She appears stable. Dr.  Benay Spice reviewed the CT report and images with Ms. Boldon and her mother. She understands the enlarged right neck lymph node is likely related to the Castleman's. She appears asymptomatic. Observation versus treatment with single agent Rituxan was discussed. We reviewed potential toxicities associated with Rituxan including an allergic reaction, infusion related reaction, reactivation of hepatitis B, leukoencephalitis, increased risk of infection, effect on the blood counts. She was provided with written information as well. She is comfortable with observation. She will return for a followup visit in 6 weeks. She understands to contact the office prior to that visit if she notes any change in the palpable right neck lymph node.   Patient seen with Dr. Benay Spice. 25 minutes were spent face-to-face at today's visit with the majority of that time involved in counseling/coordination of care.    Owens Shark ANP/GNP-BC   08/04/2013  4:56 PM  This was a shared visit with [name]. Ms. Rawlinson was interviewed and examined. She does not appear symptomatic from the right neck lymph node. We reviewed treatment options including continued observation versus rituximab. We discussed a biopsy of the right neck lymph node. She would like to continue observation. She will return for an office visit in 6 weeks. She will contact us in the interim for new symptoms.  [name], M.D.

## 2013-08-04 NOTE — Telephone Encounter (Signed)
Gave pt appt for MD only on June 2015 °

## 2013-09-17 ENCOUNTER — Telehealth: Payer: Self-pay | Admitting: Oncology

## 2013-09-17 ENCOUNTER — Ambulatory Visit (HOSPITAL_BASED_OUTPATIENT_CLINIC_OR_DEPARTMENT_OTHER): Payer: Managed Care, Other (non HMO) | Admitting: Oncology

## 2013-09-17 VITALS — BP 116/72 | HR 78 | Temp 98.1°F | Resp 18 | Ht 59.0 in | Wt 157.6 lb

## 2013-09-17 DIAGNOSIS — R599 Enlarged lymph nodes, unspecified: Secondary | ICD-10-CM

## 2013-09-17 DIAGNOSIS — D47Z2 Castleman disease: Secondary | ICD-10-CM

## 2013-09-17 NOTE — Progress Notes (Signed)
  [place] OFFICE PROGRESS NOTE   Diagnosis: Castleman's disease  INTERVAL HISTORY:   She returns as scheduled. The right neck lymph node is unchanged. The lymph node is tender. No dyspnea or swallowing difficulty. No fever or night sweats. No other complaint.  Objective:  Vital signs in last 24 hours:  Blood pressure 116/72, pulse 78, temperature 98.1 F (36.7 C), temperature source Oral, resp. rate 18, height 4\' 11"  (1.499 m), weight 157 lb 9.6 oz (71.487 kg), not currently breastfeeding.    HEENT: Oropharynx without visible mass Lymphatics: 2 cm high right anterior cervical lymph node with mild associated tenderness. No other cervical, supraclavicular, axillary, or inguinal nodes Resp: Lungs clear bilaterally Cardio: Regular rate and rhythm GI: No hepatosplenomegaly, nontender, no mass Vascular: No leg edema   Lab Results:  Lab Results  Component Value Date   WBC 7.5 07/10/2013   HGB 15.5 07/10/2013   HCT 46.7* 07/10/2013   MCV 91.6 07/10/2013   PLT 245 07/10/2013   NEUTROABS 4.5 07/10/2013    Medications: I have reviewed the patient's current medications.  Assessment/Plan: 1. Castleman's disease. A CT of the chest 06/09/2009 confirmed a right paratracheal mass. A mediastinoscopy 06/21/2009 with biopsy of the mediastinal mass confirmed Castleman's disease. Staging CTs of the chest, abdomen and pelvis 08/12/2009 confirmed a right paratracheal mass, a superior mediastinal lymph node and a left cervical/supraclavicular lymph node. There was no evidence of Castleman's disease in the abdomen or pelvis. A CT on 11/07/2009 was stable.   Restaging CTs 07/10/2013 confirmed worsening of a single level 2 right cervical lymph node compared to a CT from 11/07/2009. No significant change in paratracheal lymphadenopathy. 2. G2 P2, status post delivery of her second child in January 2015 3. Bilateral salpingectomy 06/05/2013 4. Right anterior cervical lymph node on exam  07/03/2013--stable   Disposition:  She appears unchanged. She is asymptomatic from the Castleman's disease aside from mild tenderness associated with the palpable right cervical lymph node. She would like to continue observation. Ms. Cavness will return for an office visit in 2 months. The plan is to begin rituximab therapy if the cervical node enlarges. She will contact us in the interim for new symptoms.  Ladell Pier, MD  09/17/2013  9:03 AM

## 2013-09-17 NOTE — Telephone Encounter (Signed)
gv adn printed appt sched anda vs for pt for Aug °

## 2013-09-25 ENCOUNTER — Ambulatory Visit: Payer: Managed Care, Other (non HMO) | Admitting: Nurse Practitioner

## 2013-11-20 ENCOUNTER — Ambulatory Visit (HOSPITAL_BASED_OUTPATIENT_CLINIC_OR_DEPARTMENT_OTHER): Payer: Managed Care, Other (non HMO) | Admitting: Nurse Practitioner

## 2013-11-20 ENCOUNTER — Telehealth: Payer: Self-pay | Admitting: Nurse Practitioner

## 2013-11-20 VITALS — BP 114/64 | HR 79 | Temp 98.2°F | Resp 18 | Ht 59.0 in | Wt 155.9 lb

## 2013-11-20 DIAGNOSIS — D47Z2 Castleman disease: Secondary | ICD-10-CM

## 2013-11-20 DIAGNOSIS — R599 Enlarged lymph nodes, unspecified: Secondary | ICD-10-CM

## 2013-11-20 NOTE — Progress Notes (Signed)
  [place] OFFICE PROGRESS NOTE   Diagnosis:  Castleman's disease.  INTERVAL HISTORY:   Tracy Downs returns as scheduled. She has noted no change in the right neck lymph node. No fevers or sweats. No shortness of breath. She has a good appetite and overall good energy level.  Objective:  Vital signs in last 24 hours:  Blood pressure 114/64, pulse 79, temperature 98.2 F (36.8 C), temperature source Oral, resp. rate 18, height 4\' 11"  (1.499 m), weight 155 lb 14.4 oz (70.716 kg), SpO2 100.00%, not currently breastfeeding.    HEENT: No thrush or ulcerations. Lymphatics: 2 cm high right anterior cervical lymph node with mild associated tenderness. No other palpable cervical, supraclavicular, axillary or inguinal lymph nodes. Resp: Lungs clear bilaterally. Cardio: Regular rate and rhythm. GI: Abdomen soft and nontender. No organomegaly. Vascular: No leg edema.   Lab Results:  Lab Results  Component Value Date   WBC 7.5 07/10/2013   HGB 15.5 07/10/2013   HCT 46.7* 07/10/2013   MCV 91.6 07/10/2013   PLT 245 07/10/2013   NEUTROABS 4.5 07/10/2013    Imaging:  No results found.  Medications: I have reviewed the patient's current medications.  Assessment/Plan: 1. Castleman's disease. A CT of the chest 06/09/2009 confirmed a right paratracheal mass. A mediastinoscopy 06/21/2009 with biopsy of the mediastinal mass confirmed Castleman's disease. Staging CTs of the chest, abdomen and pelvis 08/12/2009 confirmed a right paratracheal mass, a superior mediastinal lymph node and a left cervical/supraclavicular lymph node. There was no evidence of Castleman's disease in the abdomen or pelvis. A CT on 11/07/2009 was stable.  Restaging CTs 07/10/2013 confirmed worsening of a single level 2 right cervical lymph node compared to a CT from 11/07/2009. No significant change in paratracheal lymphadenopathy. 2. G2 P2, status post delivery of her second child in January 2015 3. Bilateral  salpingectomy 06/05/2013 4. Right anterior cervical lymph node on exam 07/03/2013--stable.   Disposition: Tracy Downs appears stable. The right cervical lymph node is unchanged in size. She would like to continue on an observation approach. The plan is to begin Rituxan if the cervical node enlarges. She will return for a followup visit and a chest x-ray in 3 months. She will contact the office in the interim with any problems.  Plan reviewed with Dr. Benay Spice.   [name] ANP/GNP-BC   11/20/2013  2:52 PM

## 2013-11-20 NOTE — Telephone Encounter (Signed)
Pt confirmed labs/ov per 08/07 POF, gave pt AVS....KJ °

## 2014-02-15 ENCOUNTER — Encounter: Payer: Self-pay | Admitting: Obstetrics & Gynecology

## 2014-02-24 ENCOUNTER — Ambulatory Visit (HOSPITAL_COMMUNITY)
Admission: RE | Admit: 2014-02-24 | Discharge: 2014-02-24 | Disposition: A | Discharge status: Home / Self Care | Payer: Managed Care, Other (non HMO) | Source: Ambulatory Visit | Attending: Diagnostic Radiology | Admitting: Diagnostic Radiology

## 2014-02-24 ENCOUNTER — Ambulatory Visit (HOSPITAL_BASED_OUTPATIENT_CLINIC_OR_DEPARTMENT_OTHER): Payer: Managed Care, Other (non HMO) | Admitting: Oncology

## 2014-02-24 ENCOUNTER — Telehealth: Payer: Self-pay | Admitting: Oncology

## 2014-02-24 VITALS — BP 130/72 | HR 72 | Temp 98.0°F | Resp 18 | Ht 59.0 in | Wt 154.9 lb

## 2014-02-24 DIAGNOSIS — D47Z2 Castleman disease: Secondary | ICD-10-CM

## 2014-02-24 DIAGNOSIS — R599 Enlarged lymph nodes, unspecified: Secondary | ICD-10-CM

## 2014-02-24 DIAGNOSIS — R221 Localized swelling, mass and lump, neck: Secondary | ICD-10-CM | POA: Insufficient documentation

## 2014-02-24 NOTE — Progress Notes (Signed)
  [place] OFFICE PROGRESS NOTE   Diagnosis: Castleman's disease  INTERVAL HISTORY:   She returns as scheduled. She feels well. No change in the right neck lymph node. No dyspnea. No swallowing difficulty. No complaint.  Objective:  Vital signs in last 24 hours:  Blood pressure 130/72, pulse 72, temperature 98 F (36.7 C), temperature source Oral, resp. rate 18, height 4\' 11"  (1.499 m), weight 154 lb 14.4 oz (70.262 kg), last menstrual period 02/24/2014, SpO2 100 %, not currently breastfeeding.    HEENT: oropharynx without visible mass Lymphatics: 2-3 cm right high anterior cervical node posterior to the right submandibular gland. No other cervical, supraclavicular, axillary, or inguinal nodes Resp: lungs clear bilaterally Cardio: regular rate and rhythm GI: no hepatosplenomegaly Vascular: no leg edema    Imaging:  Dg Chest 2 View  02/24/2014   CLINICAL DATA:  Right-sided peritracheal mass, patient has Castleman's disease  EXAM: CHEST  2 VIEW  COMPARISON:  CT chest of 07/10/2013 and chest x-ray of 12/09/2012  FINDINGS: The right paratracheal soft tissue mass has not changed in size or configuration. No additional adenopathy is seen. The lungs are clear. The heart is within normal limits in size. No bony abnormality is evident.  IMPRESSION: 1. Stable right paratracheal soft tissue mass. No other evidence of adenopathy. 2. No infiltrate or effusion.   Electronically Signed   By: Ivar Drape M.D.   On: 02/24/2014 10:12    Medications: I have reviewed the patient's current medications.  Assessment/Plan: 1. Castleman's disease. A CT of the chest 06/09/2009 confirmed a right paratracheal mass. A mediastinoscopy 06/21/2009 with biopsy of the mediastinal mass confirmed Castleman's disease. Staging CTs of the chest, abdomen and pelvis 08/12/2009 confirmed a right paratracheal mass, a superior mediastinal lymph node and a left cervical/supraclavicular lymph node. There was  no evidence of Castleman's disease in the abdomen or pelvis. A CT on 11/07/2009 was stable.  Restaging CTs 07/10/2013 confirmed worsening of a single level 2 right cervical lymph node compared to a CT from 11/07/2009. No significant change in paratracheal lymphadenopathy.  Chest x-ray 02/24/2014-stable right paratracheal soft tissue mass 2. G2 P2, status post delivery of her second child in January 2015 3. Bilateral salpingectomy 06/05/2013 4. Right anterior cervical lymph node on exam 07/03/2013--stable.   Disposition:  She remains asymptomatic from the Castleman's disease.No clinical evidence of disease progression. She is comfortable with continued observation. She will contact us for enlargement of the palpable lymph node or new symptoms. Ms. Guzy will return for an office visit in 3 months.  Betsy Coder, MD  02/24/2014  10:22 AM

## 2014-02-24 NOTE — Telephone Encounter (Signed)
Gave avs & cal for Feb 2016. °

## 2014-04-12 ENCOUNTER — Encounter: Payer: Self-pay | Admitting: *Deleted

## 2014-04-13 ENCOUNTER — Encounter: Payer: Self-pay | Admitting: Obstetrics & Gynecology

## 2014-05-24 ENCOUNTER — Telehealth: Payer: Self-pay | Admitting: Oncology

## 2014-05-24 ENCOUNTER — Ambulatory Visit (HOSPITAL_BASED_OUTPATIENT_CLINIC_OR_DEPARTMENT_OTHER): Payer: BLUE CROSS/BLUE SHIELD | Admitting: Oncology

## 2014-05-24 VITALS — BP 120/81 | HR 73 | Temp 98.3°F | Resp 18 | Ht 59.0 in | Wt 155.0 lb

## 2014-05-24 DIAGNOSIS — R599 Enlarged lymph nodes, unspecified: Secondary | ICD-10-CM

## 2014-05-24 DIAGNOSIS — D47Z2 Castleman disease: Secondary | ICD-10-CM

## 2014-05-24 NOTE — Progress Notes (Signed)
  [place] OFFICE PROGRESS NOTE   Diagnosis:  Castleman's disease  INTERVAL HISTORY:    she returns as scheduled. She feels well. No dyspnea or difficulty swallowing. The right neck lymph node has not changed. The lymph node is tender.  Objective:  Vital signs in last 24 hours:  Blood pressure 120/81, pulse 73, temperature 98.3 F (36.8 C), temperature source Oral, resp. rate 18, height 4\' 11"  (1.499 m), weight 155 lb (70.308 kg), SpO2 98 %, not currently breastfeeding.    HEENT:  Neck without mass Lymphatics:  3 cm right high anterior cervical node posterior to the right submandibular gland. No other cervical, supraclavicular, axillary, or inguinal nodes Resp:  Lungs clear bilaterally Cardio:  Regular rate and rhythm GI:  No  hepatosplenomegaly Vascular:  No leg edema   Medications: I have reviewed the patient's current medications.  Assessment/Plan: 1. Castleman's disease. A CT of the chest 06/09/2009 confirmed a right paratracheal mass. A mediastinoscopy 06/21/2009 with biopsy of the mediastinal mass confirmed Castleman's disease. Staging CTs of the chest, abdomen and pelvis 08/12/2009 confirmed a right paratracheal mass, a superior mediastinal lymph node and a left cervical/supraclavicular lymph node. There was no evidence of Castleman's disease in the abdomen or pelvis. A CT on 11/07/2009 was stable.  Restaging CTs 07/10/2013 confirmed worsening of a single level 2 right cervical lymph node compared to a CT from 11/07/2009. No significant change in paratracheal lymphadenopathy.  Chest x-ray 02/24/2014-stable right paratracheal soft tissue mass 2. G2 P2, status post delivery of her second child in January 2015 3. Bilateral salpingectomy 06/05/2013 4. Right anterior cervical lymph node on exam 07/03/2013--stable.   Disposition:   she appears unchanged. She remains asymptomatic from the Castleman's disease aside from mild tenderness associated with the  right cervical lymph node. We discussed beginning systemic therapy versus continued observation. She will like to continue observation. She will contact us for new symptoms. [name] will return for an office visit and chest x-ray in 3 months.  Tracy Coder, MD  05/24/2014  5:27 PM

## 2014-05-24 NOTE — Telephone Encounter (Signed)
Pt confirmed MD visit per 02/08 POF, gave pt AVS.... KJ °

## 2014-08-23 ENCOUNTER — Ambulatory Visit (HOSPITAL_BASED_OUTPATIENT_CLINIC_OR_DEPARTMENT_OTHER): Payer: BLUE CROSS/BLUE SHIELD | Admitting: Nurse Practitioner

## 2014-08-23 ENCOUNTER — Ambulatory Visit (HOSPITAL_COMMUNITY)
Admission: RE | Admit: 2014-08-23 | Discharge: 2014-08-23 | Disposition: A | Discharge status: Home / Self Care | Payer: BLUE CROSS/BLUE SHIELD | Source: Ambulatory Visit | Attending: Oncology | Admitting: Oncology

## 2014-08-23 ENCOUNTER — Telehealth: Payer: Self-pay | Admitting: Oncology

## 2014-08-23 VITALS — BP 122/78 | HR 99 | Temp 98.2°F | Resp 20 | Ht 59.0 in | Wt 155.3 lb

## 2014-08-23 DIAGNOSIS — Z8572 Personal history of non-Hodgkin lymphomas: Secondary | ICD-10-CM | POA: Insufficient documentation

## 2014-08-23 DIAGNOSIS — D47Z2 Castleman disease: Secondary | ICD-10-CM

## 2014-08-23 DIAGNOSIS — R599 Enlarged lymph nodes, unspecified: Secondary | ICD-10-CM

## 2014-08-23 DIAGNOSIS — R222 Localized swelling, mass and lump, trunk: Secondary | ICD-10-CM | POA: Insufficient documentation

## 2014-08-23 NOTE — Progress Notes (Signed)
  [place] OFFICE PROGRESS NOTE   Diagnosis:  Castleman's disease  INTERVAL HISTORY:   Tracy Downs returns as scheduled. She feels well. The right neck lymph node is unchanged. She denies dysphagia. No shortness of breath. No fevers or sweats. She has a good appetite. Her weight is stable.  Objective:  Vital signs in last 24 hours:  Blood pressure 122/78, pulse 99, temperature 98.2 F (36.8 C), temperature source Oral, resp. rate 20, height 4\' 11"  (1.499 m), weight 155 lb 4.8 oz (70.444 kg), last menstrual period 08/16/2014, SpO2 100 %, not currently breastfeeding.    HEENT: No thrush or ulcers. Lymphatics: 3 cm high right anterior cervical lymph node posterior to the right submandibular gland. No other cervical, supra clavicular, axillary or inguinal lymph nodes. Resp: Lungs clear bilaterally. Cardio: Regular rate and rhythm. GI: Abdomen soft and nontender. No organomegaly. Vascular: No leg edema.   Lab Results:  Lab Results  Component Value Date   WBC 7.5 07/10/2013   HGB 15.5 07/10/2013   HCT 46.7* 07/10/2013   MCV 91.6 07/10/2013   PLT 245 07/10/2013   NEUTROABS 4.5 07/10/2013    Imaging:  Dg Chest 2 View  08/23/2014   CLINICAL DATA:  Castleman's disease. Followup chest mass. History of lymphoma.  EXAM: CHEST  2 VIEW  COMPARISON:  02/24/2014  FINDINGS: Stable right peritracheal mass. No other indication of mediastinal adenopathy or mass. Normal heart size and aortic contours. There is no edema, consolidation, effusion, or pneumothorax.  IMPRESSION: Stable chest at least 2011, including right paratracheal mass.   Electronically Signed   By: Monte Fantasia M.D.   On: 08/23/2014 08:24    Medications: I have reviewed the patient's current medications.  Assessment/Plan: 1. Castleman's disease. A CT of the chest 06/09/2009 confirmed a right paratracheal mass. A mediastinoscopy 06/21/2009 with biopsy of the mediastinal mass confirmed Castleman's disease.  Staging CTs of the chest, abdomen and pelvis 08/12/2009 confirmed a right paratracheal mass, a superior mediastinal lymph node and a left cervical/supraclavicular lymph node. There was no evidence of Castleman's disease in the abdomen or pelvis. A CT on 11/07/2009 was stable.  Restaging CTs 07/10/2013 confirmed worsening of a single level 2 right cervical lymph node compared to a CT from 11/07/2009. No significant change in paratracheal lymphadenopathy.  Chest x-ray 02/24/2014-stable right paratracheal soft tissue mass  Chest x-ray 08/23/2014-stable right paratracheal soft tissue mass 2. G2 P2, status post delivery of her second child in January 2015 3. Bilateral salpingectomy 06/05/2013 4. Right anterior cervical lymph node on exam 07/03/2013--stable.   Disposition: Tracy Downs appears well. She remains asymptomatic from the Castleman's disease. The right cervical lymph node is stable by exam and right paratracheal mass is stable by chest x-ray. The plan is to continue observation. We will obtain a follow-up scan of the neck and chest just prior to her next visit in 3 months. She will contact the office in the interim with any problems. We specifically discussed fever/sweats, dysphagia, shortness of breath, loss of appetite, weight loss.  Plan reviewed with Dr. Benay Spice.  [name] ANP/GNP-BC   08/23/2014  4:06 PM

## 2014-08-23 NOTE — Telephone Encounter (Signed)
Gave and printed appt sched and avs for pt for Aug °

## 2014-08-24 ENCOUNTER — Ambulatory Visit: Payer: BLUE CROSS/BLUE SHIELD | Admitting: Nurse Practitioner

## 2014-11-23 ENCOUNTER — Other Ambulatory Visit: Payer: BLUE CROSS/BLUE SHIELD

## 2014-11-23 ENCOUNTER — Ambulatory Visit (HOSPITAL_COMMUNITY): Payer: BLUE CROSS/BLUE SHIELD

## 2014-11-29 ENCOUNTER — Encounter (HOSPITAL_COMMUNITY): Payer: Self-pay

## 2014-11-29 ENCOUNTER — Ambulatory Visit (HOSPITAL_COMMUNITY)
Admission: RE | Admit: 2014-11-29 | Discharge: 2014-11-29 | Disposition: A | Discharge status: Home / Self Care | Payer: BLUE CROSS/BLUE SHIELD | Source: Ambulatory Visit | Attending: Nurse Practitioner | Admitting: Nurse Practitioner

## 2014-11-29 DIAGNOSIS — R599 Enlarged lymph nodes, unspecified: Secondary | ICD-10-CM | POA: Insufficient documentation

## 2014-11-29 DIAGNOSIS — D47Z2 Castleman disease: Secondary | ICD-10-CM

## 2014-11-29 MED ORDER — IOHEXOL 300 MG/ML  SOLN
100.0000 mL | Freq: Once | INTRAMUSCULAR | Status: AC | PRN
  Administered 2014-11-29: 80 mL via INTRAVENOUS

## 2014-11-30 ENCOUNTER — Telehealth: Payer: Self-pay | Admitting: Oncology

## 2014-11-30 ENCOUNTER — Ambulatory Visit (HOSPITAL_BASED_OUTPATIENT_CLINIC_OR_DEPARTMENT_OTHER): Payer: BLUE CROSS/BLUE SHIELD | Admitting: Oncology

## 2014-11-30 VITALS — BP 120/68 | HR 97 | Temp 98.6°F | Resp 18 | Ht 59.0 in | Wt 157.2 lb

## 2014-11-30 DIAGNOSIS — D47Z2 Castleman disease: Secondary | ICD-10-CM

## 2014-11-30 DIAGNOSIS — R599 Enlarged lymph nodes, unspecified: Secondary | ICD-10-CM

## 2014-11-30 NOTE — Telephone Encounter (Signed)
Gave patient avs report and appointments for December  °

## 2014-11-30 NOTE — Progress Notes (Signed)
[place] OFFICE PROGRESS NOTE   Diagnosis: Castleman's disease  INTERVAL HISTORY:   She returns as scheduled. She feels well. No dyspnea or cough. No chest pain. No fever or sweats. Good appetite and energy level. No complaint.  Objective:  Vital signs in last 24 hours:  Blood pressure 120/68, pulse 97, temperature 98.6 F (37 C), temperature source Oral, resp. rate 18, height 4\' 11"  (1.499 m), weight 157 lb 3.2 oz (71.305 kg), last menstrual period 11/11/2014, SpO2 100 %, not currently breastfeeding.    HEENT: Neck without mass, oropharynx without visible mass Lymphatics: Approximate 2 cm high right anterior cervical node posterior to the right submandibular gland. No other cervical, supraclavicular, axillary, or inguinal nodes Resp: Lungs clear bilaterally Cardio: Regular rate and rhythm GI: No hepatomegaly, no splenomegaly Vascular: No leg edema   Imaging:  Ct Soft Tissue Neck W Contrast  11/29/2014   CLINICAL DATA:  Restaging Castleman disease diagnosed 2011  EXAM: CT NECK AND CHEST WITH CONTRAST  TECHNIQUE: Multidetector CT imaging of the neck and chest was performed following the standard protocol during bolus administration of intravenous contrast.  CONTRAST:  60mL OMNIPAQUE IOHEXOL 300 MG/ML  SOLN  COMPARISON:  07/10/2013  FINDINGS: CT NECK FINDINGS  Pharynx and larynx: Within normal limits.  Salivary glands: Within normal limits.  Thyroid: Within normal limits.  Lymph nodes: 2.5 x 2.6 cm avidly enhancing right level II node at the carotid bifurcation (series 605/ image 32), previously 2.0 x 2.2 cm.  Otherwise, no suspicious cervical lymphadenopathy.  Vascular: Within normal limits.  Limited intracranial: Within normal limits.  Visualized orbits: Visualized globes and retroconal soft tissues are within normal limits.  Mastoids and visualized paranasal sinuses: Visualized paranasal sinuses and mastoid air cells are clear.  Skeleton: No focal osseous lesions.  CT  CHEST FINDINGS  Mediastinum/Nodes: The heart is normal in size. No pericardial effusion.  3.0 x 3.4 cm right paratracheal node (series 2/image 18), previously 3.0 x 3.6 cm when measured in a similar fashion.  No suspicious hilar or axillary lymphadenopathy.  Lungs/Pleura: Lungs are clear.  No suspicious pulmonary nodules.  No focal consolidation.  No pleural effusion or pneumothorax.  Upper Abdomen:  Visualized upper abdomen is within normal limits.  Musculoskeletal: No focal osseous lesions.  IMPRESSION: 2.5 x 2.6 cm right level II cervical lymph node, mildly increased.  3.0 x 3.4 cm right paratracheal lymph node, grossly unchanged.   Electronically Signed   By: Julian Hy M.D.   On: 11/29/2014 17:22   Ct Chest W Contrast  11/29/2014   CLINICAL DATA:  Restaging Castleman disease diagnosed 2011  EXAM: CT NECK AND CHEST WITH CONTRAST  TECHNIQUE: Multidetector CT imaging of the neck and chest was performed following the standard protocol during bolus administration of intravenous contrast.  CONTRAST:  50mL OMNIPAQUE IOHEXOL 300 MG/ML  SOLN  COMPARISON:  07/10/2013  FINDINGS: CT NECK FINDINGS  Pharynx and larynx: Within normal limits.  Salivary glands: Within normal limits.  Thyroid: Within normal limits.  Lymph nodes: 2.5 x 2.6 cm avidly enhancing right level II node at the carotid bifurcation (series 605/ image 32), previously 2.0 x 2.2 cm.  Otherwise, no suspicious cervical lymphadenopathy.  Vascular: Within normal limits.  Limited intracranial: Within normal limits.  Visualized orbits: Visualized globes and retroconal soft tissues are within normal limits.  Mastoids and visualized paranasal sinuses: Visualized paranasal sinuses and mastoid air cells are clear.  Skeleton: No focal osseous lesions.  CT CHEST FINDINGS  Mediastinum/Nodes: The  heart is normal in size. No pericardial effusion.  3.0 x 3.4 cm right paratracheal node (series 2/image 18), previously 3.0 x 3.6 cm when measured in a similar fashion.   No suspicious hilar or axillary lymphadenopathy.  Lungs/Pleura: Lungs are clear.  No suspicious pulmonary nodules.  No focal consolidation.  No pleural effusion or pneumothorax.  Upper Abdomen:  Visualized upper abdomen is within normal limits.  Musculoskeletal: No focal osseous lesions.  IMPRESSION: 2.5 x 2.6 cm right level II cervical lymph node, mildly increased.  3.0 x 3.4 cm right paratracheal lymph node, grossly unchanged.   Electronically Signed   By: Julian Hy M.D.   On: 11/29/2014 17:22    Medications: I have reviewed the patient's current medications.  Assessment/Plan: 1. Castleman's disease. A CT of the chest 06/09/2009 confirmed a right paratracheal mass. A mediastinoscopy 06/21/2009 with biopsy of the mediastinal mass confirmed Castleman's disease. Staging CTs of the chest, abdomen and pelvis 08/12/2009 confirmed a right paratracheal mass, a superior mediastinal lymph node and a left cervical/supraclavicular lymph node. There was no evidence of Castleman's disease in the abdomen or pelvis. A CT on 11/07/2009 was stable.  Restaging CTs 07/10/2013 confirmed worsening of a single level 2 right cervical lymph node compared to a CT from 11/07/2009. No significant change in paratracheal lymphadenopathy.  Chest x-ray 02/24/2014-stable right paratracheal soft tissue mass  Chest x-ray 08/23/2014-stable right paratracheal soft tissue mass  CT 11/29/2014 with a slight increase in the right cervical lymph node compared to 07/10/2013 and a stable right paratracheal node 2. G2 P2, status post delivery of her second child in January 2015 3. Bilateral salpingectomy 06/05/2013 4. Right anterior cervical lymph node on exam 07/03/2013--stable. 5.    Disposition:  She appears stable and remains asymptomatic from the Castleman's disease. She will contact us for new symptoms. We decided to continue observation. She will return for an office visit in 4 months. We will plan for a restaging CT in  approximately one year.  Betsy Coder, MD  11/30/2014  9:19 AM

## 2014-12-28 IMAGING — CR DG CHEST 2V
2 series · 2 of 2 positions shown · non-contrast
Comparison: 04/01/2012

CLINICAL DATA: Follow up mediastinal mass, lymphoma, 4 months
pregnant

CHEST - 2 VIEW

[w chest pa]
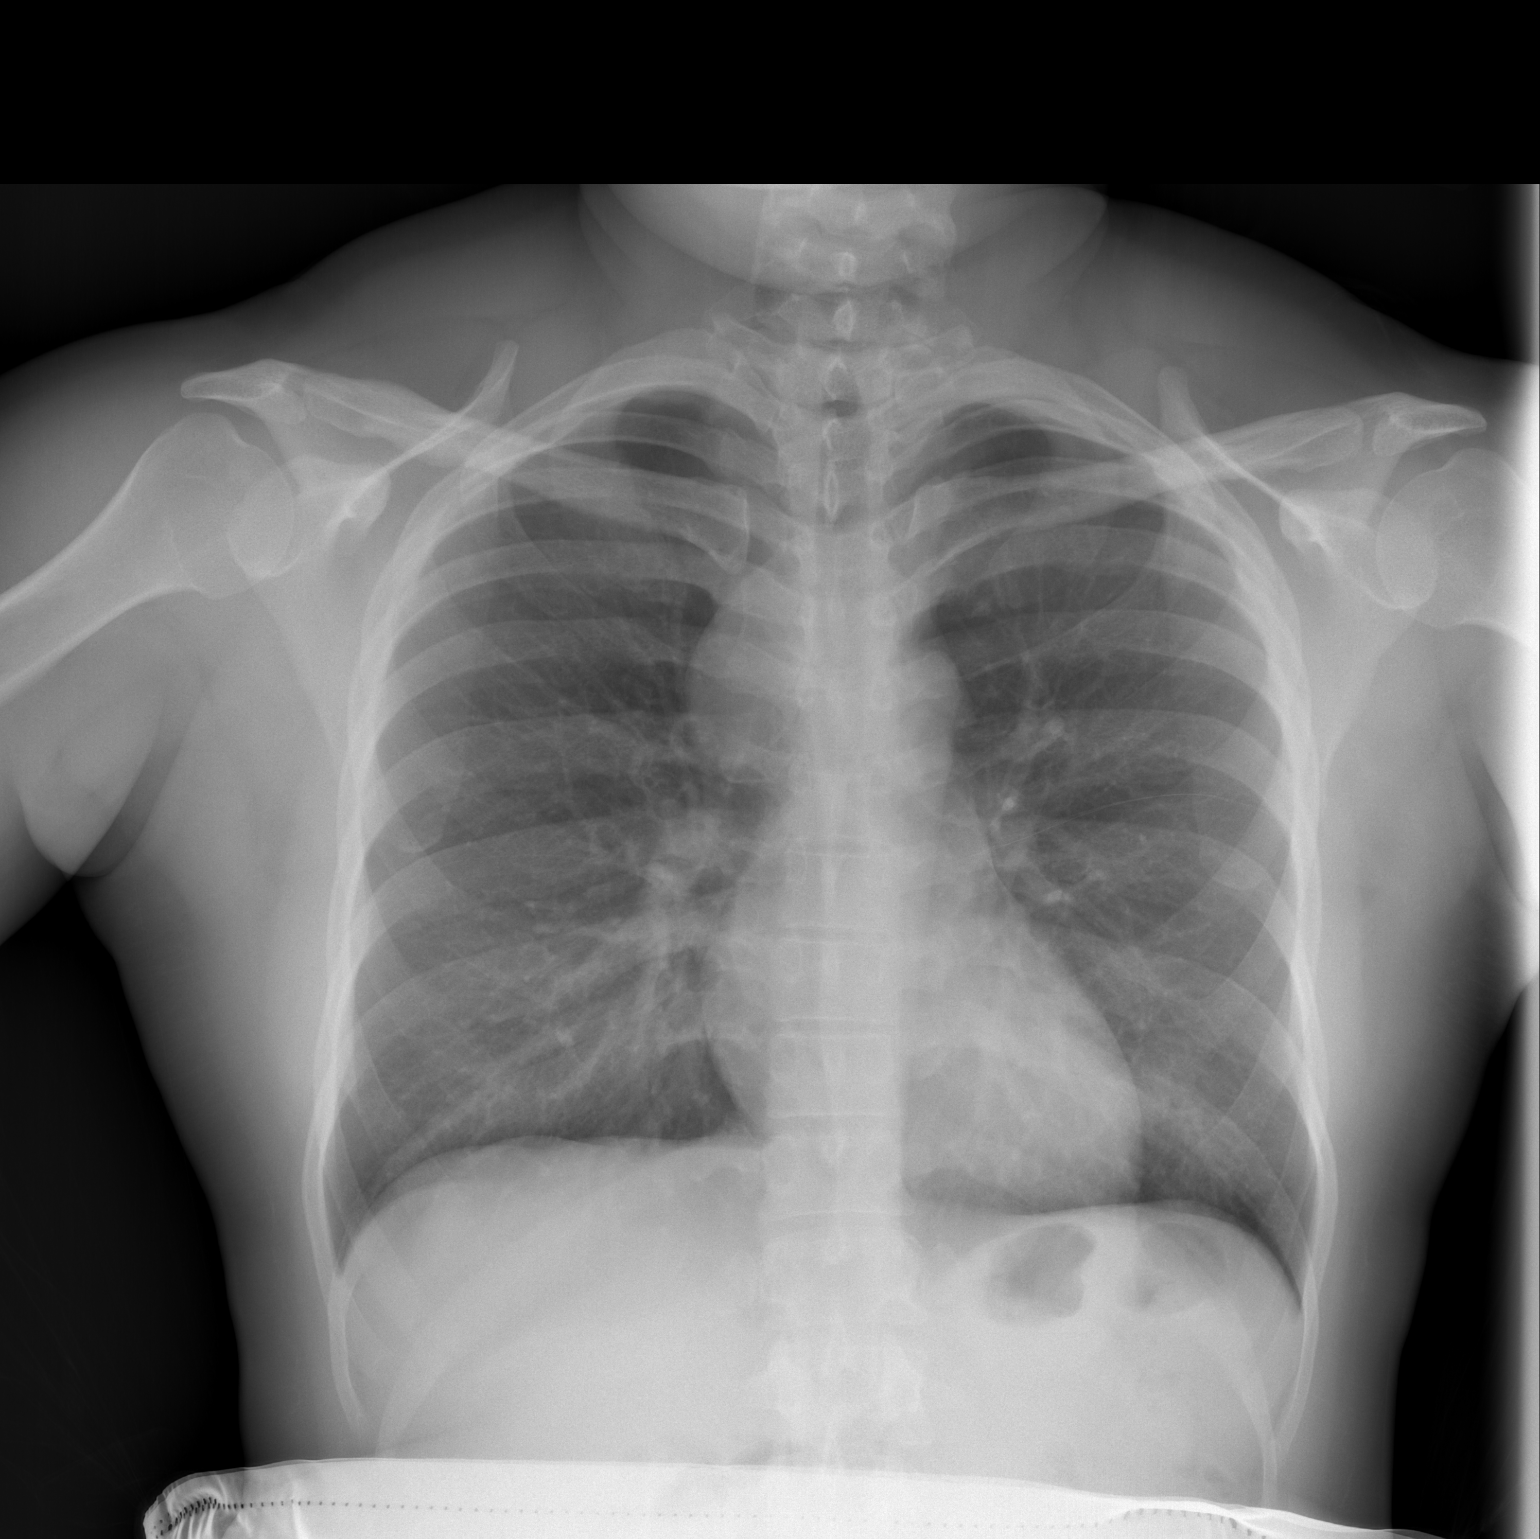

[w chest lat]
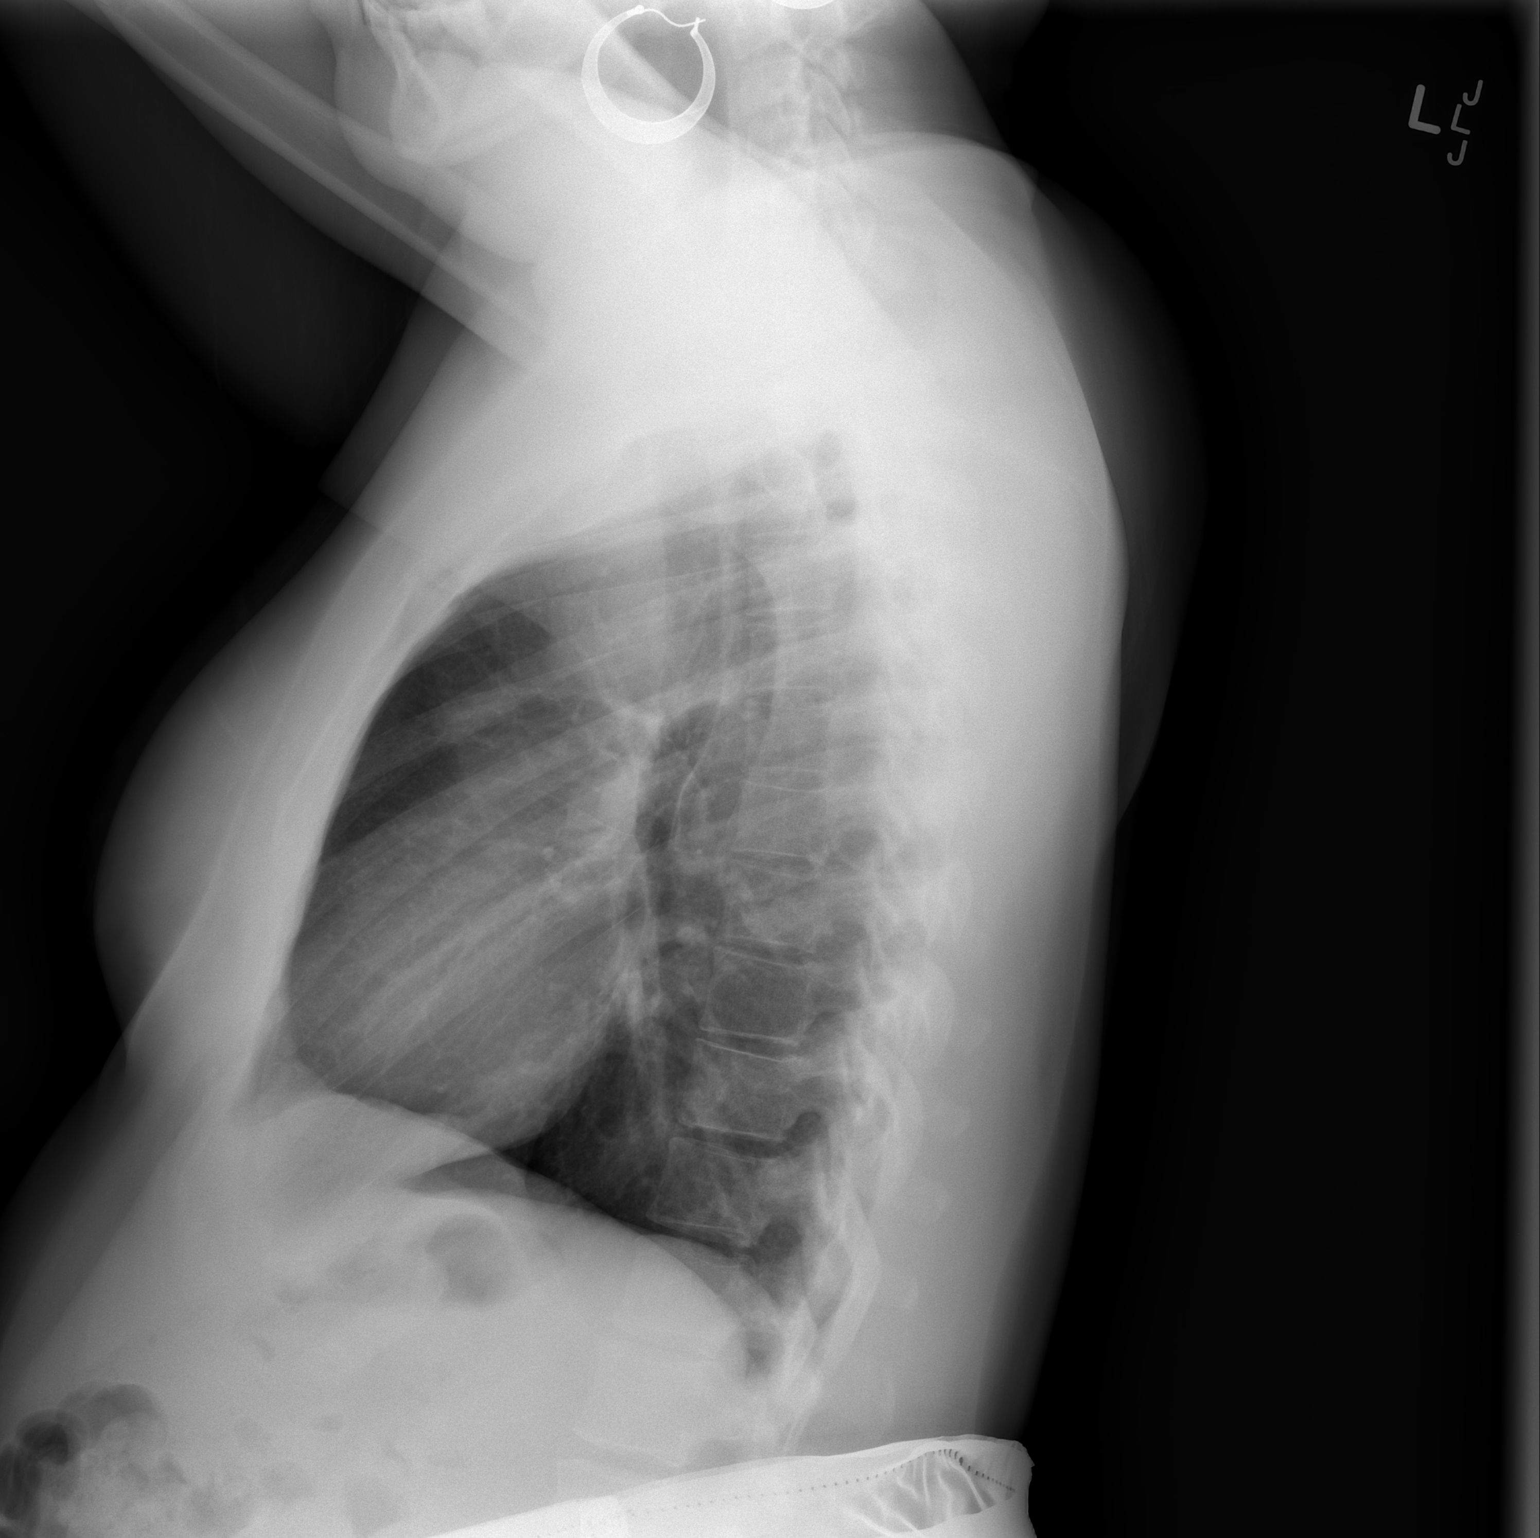

[2 of 2 positions shown; findings below may reference images not displayed]

FINDINGS: Stable rounded right paratracheal nodal mass.

The lungs are clear. No pleural effusion or pneumothorax.

The heart is normal in size.

Visualized osseous structures are within normal limits.
IMPRESSION: Stable right paratracheal nodal mass.

## 2015-03-04 ENCOUNTER — Other Ambulatory Visit: Payer: Self-pay | Admitting: Obstetrics and Gynecology

## 2015-03-04 ENCOUNTER — Other Ambulatory Visit (HOSPITAL_COMMUNITY)
Admission: RE | Admit: 2015-03-04 | Discharge: 2015-03-04 | Disposition: A | Discharge status: Home / Self Care | Payer: BLUE CROSS/BLUE SHIELD | Source: Ambulatory Visit | Attending: Obstetrics and Gynecology | Admitting: Obstetrics and Gynecology

## 2015-03-04 DIAGNOSIS — Z01419 Encounter for gynecological examination (general) (routine) without abnormal findings: Secondary | ICD-10-CM | POA: Insufficient documentation

## 2015-03-04 DIAGNOSIS — Z1151 Encounter for screening for human papillomavirus (HPV): Secondary | ICD-10-CM | POA: Insufficient documentation

## 2015-03-07 LAB — CYTOLOGY - PAP

## 2015-03-28 ENCOUNTER — Telehealth: Payer: Self-pay | Admitting: Oncology

## 2015-03-28 NOTE — Telephone Encounter (Signed)
pt cld to cx appt-stated will call @ a later time to r/s

## 2015-03-29 ENCOUNTER — Ambulatory Visit: Payer: BLUE CROSS/BLUE SHIELD | Admitting: Oncology

## 2016-05-21 ENCOUNTER — Telehealth: Payer: Self-pay | Admitting: Oncology

## 2016-05-21 NOTE — Telephone Encounter (Signed)
Pt called to r/s missed appt . Gave pt new appt date/time per request

## 2016-06-05 ENCOUNTER — Telehealth: Payer: Self-pay | Admitting: Oncology

## 2016-06-05 ENCOUNTER — Ambulatory Visit (HOSPITAL_BASED_OUTPATIENT_CLINIC_OR_DEPARTMENT_OTHER): Payer: BLUE CROSS/BLUE SHIELD

## 2016-06-05 ENCOUNTER — Ambulatory Visit (HOSPITAL_BASED_OUTPATIENT_CLINIC_OR_DEPARTMENT_OTHER): Payer: BLUE CROSS/BLUE SHIELD | Admitting: Oncology

## 2016-06-05 VITALS — BP 132/84 | HR 88 | Temp 97.8°F | Resp 16 | Ht 59.0 in | Wt 158.8 lb

## 2016-06-05 DIAGNOSIS — D47Z2 Castleman disease: Secondary | ICD-10-CM | POA: Diagnosis not present

## 2016-06-05 LAB — BASIC METABOLIC PANEL
Anion Gap: 9 mEq/L (ref 3–11)
BUN: 11.5 mg/dL (ref 7.0–26.0)
CHLORIDE: 106 meq/L (ref 98–109)
CO2: 25 meq/L (ref 22–29)
Calcium: 9.7 mg/dL (ref 8.4–10.4)
Creatinine: 0.8 mg/dL (ref 0.6–1.1)
EGFR: >90 mL/min/{1.73_m2} (ref >90)
Glucose: 96 mg/dl (ref 70–140)
POTASSIUM: 4.5 meq/L (ref 3.5–5.1)
Sodium: 139 mEq/L (ref 136–145)

## 2016-06-05 LAB — CBC WITH DIFFERENTIAL/PLATELET
BASO%: 0.7 % (ref 0.0–2.0)
Basophils Absolute: 0.1 10*3/uL (ref 0.0–0.1)
EOS ABS: 0.2 10*3/uL (ref 0.0–0.5)
EOS%: 2.8 % (ref 0.0–7.0)
HCT: 40.9 % (ref 34.8–46.6)
HEMOGLOBIN: 12.8 g/dL (ref 11.6–15.9)
LYMPH%: 26.5 % (ref 14.0–49.7)
MCH: 28 pg (ref 25.1–34.0)
MCHC: 31.3 g/dL — ABNORMAL LOW (ref 31.5–36.0)
MCV: 89.5 fL (ref 79.5–101.0)
MONO#: 0.4 10*3/uL (ref 0.1–0.9)
MONO%: 5.4 % (ref 0.0–14.0)
NEUT%: 64.6 % (ref 38.4–76.8)
NEUTROS ABS: 4.8 10*3/uL (ref 1.5–6.5)
PLATELETS: 332 10*3/uL (ref 145–400)
RBC: 4.57 10*6/uL (ref 3.70–5.45)
RDW: 14.7 % — AB (ref 11.2–14.5)
WBC: 7.4 10*3/uL (ref 3.9–10.3)
lymph#: 2 10*3/uL (ref 0.9–3.3)

## 2016-06-05 NOTE — Progress Notes (Signed)
  [place] OFFICE PROGRESS NOTE   Diagnosis: Castleman's disease  INTERVAL HISTORY:   Tracy Downs was last seen at the [facility] in August 2016. She did not return for scheduled follow-up. She reports feeling well. No fever or night sweats. Stable right neck lymph node. No other palpable lymph nodes. No dyspnea. She had an upper respiratory infection last week with rhinorrhea and a cough. The symptoms have improved.  Objective:  Vital signs in last 24 hours:  Blood pressure 132/84, pulse 88, temperature 97.8 F (36.6 C), temperature source Oral, resp. rate 16, height 4\' 11"  (1.499 m), weight 158 lb 12.8 oz (72 kg), SpO2 100 %.    HEENT: Neck without mass, oral cavity without visible mass Lymphatics: 2-3 centimeter right anterior cervical node, no other cervical, supraclavicular, axillary, or inguinal nodes Resp: Lungs clear bilaterally Cardio: Regular rate and rhythm GI: No hepatosplenomegaly Vascular: No leg edema   Lab Results:  Lab Results  Component Value Date   WBC 7.5 07/10/2013   HGB 15.5 07/10/2013   HCT 46.7 (H) 07/10/2013   MCV 91.6 07/10/2013   PLT 245 07/10/2013   NEUTROABS 4.5 07/10/2013     Medications: I have reviewed the patient's current medications.  Assessment/Plan: 1. Castleman's disease. A CT of the chest 06/09/2009 confirmed a right paratracheal mass. A mediastinoscopy 06/21/2009 with biopsy of the mediastinal mass confirmed Castleman's disease. Staging CTs of the chest, abdomen and pelvis 08/12/2009 confirmed a right paratracheal mass, a superior mediastinal lymph node and a left cervical/supraclavicular lymph node. There was no evidence of Castleman's disease in the abdomen or pelvis. A CT on 11/07/2009 was stable.  Restaging CTs 07/10/2013 confirmed worsening of a single level 2 right cervical lymph node compared to a CT from 11/07/2009. No significant change in paratracheal lymphadenopathy.  Chest x-ray 02/24/2014-stable  right paratracheal soft tissue mass  Chest x-ray 08/23/2014-stable right paratracheal soft tissue mass  CT 11/29/2014 with a slight increase in the right cervical lymph node compared to 07/10/2013 and a stable right paratracheal node 2. G2 P2, status post delivery of her second child in January 2015 3. Bilateral salpingectomy 06/05/2013 4. Right anterior cervical lymph node on exam 07/03/2013--stable.    Disposition:  Tracy Downs appears stable and asymptomatic from the Castleman's disease. She will be referred for restaging CTs of the neck and chest within the next one week. We will check a CBC and Can is to panel today. She will return for an office visit in 6 months. She will contact us in the interim for new symptoms.  Betsy Coder, MD  06/05/2016  8:48 AM

## 2016-06-05 NOTE — Telephone Encounter (Signed)
Appointments scheduled per 2/20 LOS. Patient given AVS report and calendars with future scheduled appointments. °

## 2016-07-05 ENCOUNTER — Other Ambulatory Visit: Payer: Self-pay | Admitting: *Deleted

## 2016-07-11 ENCOUNTER — Ambulatory Visit (HOSPITAL_COMMUNITY)
Admission: RE | Admit: 2016-07-11 | Discharge: 2016-07-11 | Disposition: A | Discharge status: Home / Self Care | Payer: BLUE CROSS/BLUE SHIELD | Source: Ambulatory Visit | Attending: Oncology | Admitting: Oncology

## 2016-07-11 ENCOUNTER — Encounter (HOSPITAL_COMMUNITY): Payer: Self-pay

## 2016-07-11 DIAGNOSIS — D47Z2 Castleman disease: Secondary | ICD-10-CM | POA: Diagnosis not present

## 2016-07-11 MED ORDER — IOPAMIDOL (ISOVUE-300) INJECTION 61%
100.0000 mL | Freq: Once | INTRAVENOUS | Status: AC | PRN
  Administered 2016-07-11: 100 mL via INTRAVENOUS

## 2016-07-11 MED ORDER — IOPAMIDOL (ISOVUE-300) INJECTION 61%
INTRAVENOUS | Status: AC
  Filled 2016-07-11: qty 100

## 2016-07-17 ENCOUNTER — Telehealth: Payer: Self-pay | Admitting: *Deleted

## 2016-07-17 ENCOUNTER — Other Ambulatory Visit: Payer: Self-pay | Admitting: Oncology

## 2016-07-17 DIAGNOSIS — D47Z2 Castleman disease: Secondary | ICD-10-CM

## 2016-07-17 NOTE — Telephone Encounter (Signed)
-----   Message from Ladell Pier, MD sent at 07/17/2016  4:56 PM EDT ----- Please call patient, referral in process to Dr. Lissa Morales surgery to consider removal of rt. Neck mass  I already discussed CT finding and likely need for surgery with her

## 2016-07-17 NOTE — Telephone Encounter (Signed)
Call placed to patient to notify her per order of Dr. Benay Spice that referral is in process for her to see Dr. Trula Slade who is a vascular surgeon to consider removal of right neck mass.  Patient appreciative of update and has no questions at this time.

## 2016-07-23 ENCOUNTER — Telehealth: Payer: Self-pay | Admitting: Medical Oncology

## 2016-07-23 NOTE — Telephone Encounter (Signed)
I called pt back with update. Asked her to call back if she does not hear anything tomorrow.

## 2016-07-23 NOTE — Telephone Encounter (Signed)
She has not heard from vascular surgery . I sent message to scheduling to look at referral in chart -it is not authorized.

## 2016-07-24 ENCOUNTER — Telehealth: Payer: Self-pay | Admitting: Internal Medicine

## 2016-07-24 ENCOUNTER — Telehealth: Payer: Self-pay | Admitting: Oncology

## 2016-07-24 NOTE — Telephone Encounter (Signed)
Spoke with patient re lab/fu 4/11.

## 2016-07-24 NOTE — Telephone Encounter (Signed)
Per 4/9 sch msg order referral entered for vascular specialist and patient has not heard from office. eferral re-routed to [facility] and [facility] has scheduled and contacted patent re appointment. See patient appointments desk.

## 2016-08-20 ENCOUNTER — Encounter: Payer: BLUE CROSS/BLUE SHIELD | Admitting: Surgery

## 2016-08-22 ENCOUNTER — Encounter: Payer: Self-pay | Admitting: Surgery

## 2016-08-27 ENCOUNTER — Encounter: Payer: Self-pay | Admitting: Surgery

## 2016-08-27 ENCOUNTER — Ambulatory Visit (INDEPENDENT_AMBULATORY_CARE_PROVIDER_SITE_OTHER): Payer: BLUE CROSS/BLUE SHIELD | Admitting: Surgery

## 2016-08-27 VITALS — BP 128/88 | HR 95 | Temp 100.3°F | Resp 16 | Ht 59.0 in | Wt 163.0 lb

## 2016-08-27 DIAGNOSIS — D446 Neoplasm of uncertain behavior of carotid body: Secondary | ICD-10-CM | POA: Diagnosis not present

## 2016-08-27 NOTE — Progress Notes (Signed)
[facility] [place]  Patient name: Tracy Downs MRN: [id number] DOB: Nov 06, 1980 Sex: female   REFERRING PROVIDER:    Dr. Benay Spice   REASON FOR CONSULT:    Carotid body tumor  HISTORY OF PRESENT ILLNESS:   Tracy Downs is a 36 y.o. 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  This was seen on CT scan recently.  It has been growing.  Originally it was felt to be a lymph node, however now that it opens up the carotid bifurcation is favored a carotid body tumor.  The patient does not suffer from any constitutional symptoms such as fever or chills or night sweats.  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 Deafness in left ear   . Pre-eclampsia      FAMILY HISTORY   Family History  Problem Relation Age of Onset  . Cancer Mother        breast  . Kidney disease Brother   . Diabetes Maternal Grandmother   . Hypertension Maternal Grandmother     SOCIAL HISTORY:   Social History   Social History  . Marital status: Married    Spouse name: N/A  . Number of children: N/A  . Years of education: N/A   Occupational History  . Not on file.   Social History Main Topics  . Smoking status: Never Smoker  . Smokeless tobacco: Never Used  . Alcohol use Yes     Comment: occasionally   . Drug use: No  . Sexual activity: Yes    Partners: Male    Birth control/ protection: None   Other Topics Concern  . Not on file   Social History Narrative  . No narrative on file    ALLERGIES:    Allergies  Allergen Reactions  . Hydrocodone Other (See Comments)    Urinary retention  . Amoxicillin Rash  . Penicillins Rash    CURRENT MEDICATIONS:    Current Outpatient Prescriptions  Medication Sig Dispense Refill  .  Multiple Vitamins-Minerals (MULTIPLE VITAMINS/WOMENS PO) Take by mouth.     No current facility-administered medications for this visit.     REVIEW OF SYSTEMS:   [X]  denotes positive finding, [ ]  denotes negative finding Cardiac  Comments:  Chest pain or chest pressure:    Shortness of breath upon exertion:    Short of breath when lying flat:    Irregular heart rhythm:        Vascular    Pain in calf, thigh, or hip brought on by ambulation:    Pain in feet at night that wakes you up from your sleep:     Blood clot in your veins:    Leg swelling:         Pulmonary    Oxygen at home:    Productive cough:     Wheezing:         Neurologic    Sudden weakness in arms or legs:     Sudden numbness in arms or legs:     Sudden onset of difficulty speaking or slurred speech:    Temporary loss of vision in one eye:     Problems with dizziness:         Gastrointestinal    Blood in stool:  Vomited blood:         Genitourinary    Burning when urinating:     Blood in urine:        Psychiatric    Major depression:         Hematologic    Bleeding problems:    Problems with blood clotting too easily:        Skin    Rashes or ulcers:        Constitutional    Fever or chills:     PHYSICAL EXAM:   Vitals:   08/27/16 1447  BP: 128/88  Pulse: 95  Resp: 16  Temp: 100.3 F (37.9 C)  TempSrc: Oral  SpO2: 99%  Weight: 163 lb (73.9 kg)  Height: 4\' 11"  (1.499 m)    GENERAL: The patient is a well-nourished female, in no acute distress. The vital signs are documented above. CARDIAC: There is a regular rate and rhythm.  VASCULAR: No carotid bruits.  Mobile mass within the right neck.  The borders appear to be all palpable PULMONARY: Nonlabored respirations MUSCULOSKELETAL: There are no major deformities or cyanosis. NEUROLOGIC: No focal weakness or paresthesias are detected. SKIN: There are no ulcers or rashes noted. PSYCHIATRIC: The patient has a normal  affect.  STUDIES:   I have reviewed her CT scan which shows a 2.9 x 2.8 x 3.7 cm right carotid mass likely consistent with a carotid body tumor.  In 2016, and measured 2.5 x 2.6 cm.  In 2015, this measured 2.0 x 2.2 cm.  In 2011 and measured 0.6 x 0.9  ASSESSMENT and PLAN   Resume carotid body tumor: I would like to get a CT angiogram to confirm that this is indeed a carotid body tumor as well as to determine its vascularity.  I like to see if she is a candidate for preoperative embolization of the inflow artery.  We did talk a little bit about the details of the operation.  This would be done in conjunction with ENT.  I'm going to get the CT angiogram back and have her follow-up in 1-2 weeks.   Annamarie Major, MD [facility] Special[facility] of [facility] [phone] Pager [phone]

## 2016-08-27 NOTE — Addendum Note (Signed)
Addended by: Lianne Cure A on: 08/27/2016 03:53 PM   Modules accepted: Orders

## 2016-08-28 ENCOUNTER — Telehealth: Payer: Self-pay | Admitting: Surgery

## 2016-08-28 NOTE — Telephone Encounter (Signed)
Spoke w/ Tracy Downs and she is aware of appt for CTA of Neck at [place] [address] location on Friday 08/31/16 at 9:50am. She knows to arrive at 9:30am and no solid foods 4 hours prior. I also sent pre-auth to Evelena Asa. I informed patient that I would notify her of the follow up appt w/ [facility] as soon as I schedule that. awt

## 2016-08-29 ENCOUNTER — Telehealth: Payer: Self-pay | Admitting: Surgery

## 2016-08-29 NOTE — Telephone Encounter (Signed)
-----   Message from Margy Clarks, LPN sent at 4/74/2595  9:15 AM EDT ----- Regarding: RE: CTA Pre-Auth No auth required--per Jackelyn Poling ----- Message ----- From: Lujean Amel Sent: 08/28/2016   4:13 PM To: Sloan Leiter, LPN Subject: CTA Pre-Auth                                   [facility] CTA Neck Dx code [address] insurance  Thanks, [place]

## 2016-08-29 NOTE — Telephone Encounter (Signed)
Patient is aware of CTA appt on Friday 08/31/16 at 9:50am. She is aware of no solid foods 4 hours prior and to arrive at 9:30am. Per Tracy Downs, no Josem Kaufmann is required for this patient's CTA. awt

## 2016-08-30 ENCOUNTER — Telehealth: Payer: Self-pay | Admitting: Surgery

## 2016-08-30 NOTE — Telephone Encounter (Signed)
Spoke w/ pt regarding the follow up appt with Dr.Brabham. She requested a late afternoon appt so I rescheduled from 09/24/16 to 09/26/16 at 4pm. awt

## 2016-08-30 NOTE — Telephone Encounter (Signed)
-----   Message from Serafina Mitchell, MD sent at 08/30/2016  7:51 AM EDT ----- Regarding: RE: Appointment Question Lets do 3 week f/u  ----- Message ----- From: Lujean Amel Sent: 08/28/2016   9:21 AM To: Serafina Mitchell, MD Subject: Appointment Question                           Dr.Brabham, Yesterday you noted on this patient's discharge paper that you wanted to see her back in 1-2 weeks with a CTA of Neck prior.  You are completely booked next week 09/03/16 and the following Monday we are closed for [facility]. I can schedule the CTA within the next 1-2 weeks but should I overbook you on Monday 09/03/16? Or wait and schedule her on Monday 09/17/16? Please advise. Thanks, Anne Ng

## 2016-08-31 ENCOUNTER — Ambulatory Visit
Admission: RE | Admit: 2016-08-31 | Discharge: 2016-08-31 | Disposition: A | Discharge status: Home / Self Care | Payer: BLUE CROSS/BLUE SHIELD | Source: Ambulatory Visit | Attending: Surgery | Admitting: Surgery

## 2016-08-31 DIAGNOSIS — D446 Neoplasm of uncertain behavior of carotid body: Secondary | ICD-10-CM

## 2016-08-31 MED ORDER — IOPAMIDOL (ISOVUE-370) INJECTION 76%
75.0000 mL | Freq: Once | INTRAVENOUS | Status: AC | PRN
  Administered 2016-08-31: 75 mL via INTRAVENOUS

## 2016-09-20 ENCOUNTER — Encounter: Payer: Self-pay | Admitting: Surgery

## 2016-09-24 ENCOUNTER — Ambulatory Visit: Payer: BLUE CROSS/BLUE SHIELD | Admitting: Surgery

## 2016-09-26 ENCOUNTER — Encounter: Payer: Self-pay | Admitting: Surgery

## 2016-09-26 ENCOUNTER — Ambulatory Visit (INDEPENDENT_AMBULATORY_CARE_PROVIDER_SITE_OTHER): Payer: BLUE CROSS/BLUE SHIELD | Admitting: Surgery

## 2016-09-26 VITALS — BP 124/83 | HR 100 | Resp 18 | Ht 59.0 in | Wt 161.9 lb

## 2016-09-26 DIAGNOSIS — D446 Neoplasm of uncertain behavior of carotid body: Secondary | ICD-10-CM

## 2016-09-26 NOTE — Progress Notes (Signed)
Vascular and Vein Specialist of [place]  Patient name: Tracy Downs MRN: [id number] DOB: 26-Oct-1980 Sex: female   REASON FOR VISIT:    Follow-up  HISOTRY OF PRESENT ILLNESS:   Tracy Downs is a 36 y.o. 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  This was seen on CT scan recently.  It has been growing.  Originally it was felt to be a lymph node, however now that it opens up the carotid bifurcation is favored a carotid body tumor.  The patient does not suffer from any constitutional symptoms such as fever or chills or night sweats.  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  She returns today for review of the CT angiogram.   PAST MEDICAL HISTORY:   Past Medical History:  Diagnosis Date  . Deafness in left ear   . Pre-eclampsia      FAMILY HISTORY:   Family History  Problem Relation Age of Onset  . Cancer Mother        breast  . Kidney disease Brother   . Diabetes Maternal Grandmother   . Hypertension Maternal Grandmother     SOCIAL HISTORY:   Social History  Substance Use Topics  . Smoking status: Never Smoker  . Smokeless tobacco: Never Used  . Alcohol use Yes     Comment: occasionally      ALLERGIES:   Allergies  Allergen Reactions  . Hydrocodone Other (See Comments)    Urinary retention  . Amoxicillin Rash  . Penicillins Rash     CURRENT MEDICATIONS:   Current Outpatient Prescriptions  Medication Sig Dispense Refill  . Multiple Vitamins-Minerals (MULTIPLE VITAMINS/WOMENS PO) Take by mouth.     No current facility-administered medications for this visit.     REVIEW OF SYSTEMS:   [X]  denotes positive finding, [ ]  denotes negative finding Cardiac  Comments:  Chest pain or chest pressure:    Shortness of breath upon exertion:    Short of breath when lying flat:      Irregular heart rhythm:        Vascular    Pain in calf, thigh, or hip brought on by ambulation:    Pain in feet at night that wakes you up from your sleep:     Blood clot in your veins:    Leg swelling:         Pulmonary    Oxygen at home:    Productive cough:     Wheezing:         Neurologic    Sudden weakness in arms or legs:     Sudden numbness in arms or legs:     Sudden onset of difficulty speaking or slurred speech:    Temporary loss of vision in one eye:     Problems with dizziness:         Gastrointestinal    Blood in stool:     Vomited blood:         Genitourinary    Burning when urinating:     Blood in urine:        Psychiatric    Major depression:         Hematologic    Bleeding problems:    Problems with blood clotting too easily:        Skin    Rashes or ulcers:  Constitutional    Fever or chills:      PHYSICAL EXAM:   Vitals:   09/26/16 1601 09/26/16 1603  BP: 122/84 124/83  Pulse: 100   Resp: 18   SpO2: 100%   Weight: 161 lb 14.4 oz (73.4 kg)   Height: 4\' 11"  (1.499 m)     GENERAL: The patient is a well-nourished female, in no acute distress. The vital signs are documented above. CARDIAC: There is a regular rate and rhythm.  VASCULAR: Mobile mass in the right neck PULMONARY: Non-labored respirations MUSCULOSKELETAL: There are no major deformities or cyanosis. NEUROLOGIC: No focal weakness or paresthesias are detected. SKIN: There are no ulcers or rashes noted. PSYCHIATRIC: The patient has a normal affect.  STUDIES:   I have reviewed her CT scan with the following findings: 1. Large 3.9 cm hypervascular mass within the right carotid space, splaying the proximal right ICA and ECA, and with numerous small recur did vessels extending from the right thoracic inlet to the mass. This lesion and the associated vessel recruitment have progressed since 2011 (1.5 cm mass at that time). 2. Small but very similar contralateral left  carotid space hypervascular lesion measuring 1.2 cm and situated between the proximal right ICA and ECA. This lesion appears to be without vessel recruitment, and was 1-2 mm in 2011. 3. Chronic right peritracheal soft tissue mass, thought related to Castleman disease, which was much more hypervascular in 2015 and before, and on a 07/10/2013 CT demonstrated similar surrounding feeding vessels as in #1. 4. Lastly, in recent years there have been several new reports of histology proven Castleman disease of the carotid space imitating carotid body tumor ( [employer] ). 5. Therefore, there is a a differential diagnosis of the bilateral carotid space lesions in this case: Carotid Body Tumor (paraganglioma) versus unusual manifestation of Castleman Disease. And the latter may be more likely.  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  She understands that there are bilateral lesions.  On CT scan the mass appears to go on the high side, however on physical exam I feel I can palpate the extent of the mass.  I am having the patient see Dr. Redmond Baseman this coming Tuesday for his opinion regarding resectability.  If he is in agreement to proceed, we will get the patient scheduled for removal of her carotid body tumor.  She will need preoperative embolization by neuroradiology.  I discussed the possibility of carotid artery repair, including removal of saphenous vein.  We also discussed the risks of nerve injury of the marginal mandibular, hypoglossal, vagus, and glossopharyngeal nerves.  All of her questions were answered.    Annamarie Major, MD [facility]ascular and Vein Specialists of [facility] [phone] Pager [phone]

## 2016-09-28 ENCOUNTER — Telehealth: Payer: Self-pay | Admitting: Surgery

## 2016-09-28 NOTE — Telephone Encounter (Signed)
Spoke w/ Laqueta Linden @ Dr.Bates' office today and scheduled the patient for an appt per [facility]'s instructions for 10/02/16 at 2:20pm. The patient is to arrive at 2pm. I faxed demographic info as well as office notes to their office. The patient is aware of this appointment. [facility]NT [address] [place] [phone]  awt

## 2016-10-19 ENCOUNTER — Other Ambulatory Visit: Payer: Self-pay

## 2016-10-19 ENCOUNTER — Other Ambulatory Visit: Payer: Self-pay | Admitting: Surgery

## 2016-10-19 DIAGNOSIS — D47Z2 Castleman disease: Secondary | ICD-10-CM

## 2016-10-23 ENCOUNTER — Other Ambulatory Visit: Payer: Self-pay

## 2016-11-01 ENCOUNTER — Other Ambulatory Visit: Payer: Self-pay | Admitting: Radiology

## 2016-11-02 NOTE — Progress Notes (Addendum)
PCP: Dr. Helane Rima  Cardiologist: pt denies  EKG: pt denies past year  Stress test:pt denies ever  ECHO: pt denies ever  Cardiac Cath: pt denies ever  Chest x-ray: pt denies past year

## 2016-11-02 NOTE — Pre-Procedure Instructions (Signed)
Tracy Downs  11/02/2016      [id number] - [address], [place] [place] [address] Phone: [phone] Fax: [phone]  [id number] - [place], [place] [place] [address] Phone: [phone] Fax: [phone]    Your procedure is scheduled on November 08, 2016.  Report to [facility] Admitting at 600 AM.  Call this number if you have problems the morning of surgery:  [phone]   Remember:  Do not eat food or drink liquids after midnight.  Take these medicines the morning of surgery with A SIP OF WATER loratadine (claritin)-if needed, eye drops-if needed  7 days prior to surgery STOP taking any Aspirin, Aleve, Naproxen, Ibuprofen, Motrin, Advil, Goody's, BC's, all herbal medications, fish oil, and all vitamins   Do not wear jewelry, make-up or nail polish.  Do not wear lotions, powders, or perfumes, or deoderant.  Do not shave 48 hours prior to surgery.   Do not bring valuables to the hospital.  [facility] is not responsible for any belongings or valuables.  Contacts, dentures or bridgework may not be worn into surgery.  Leave your suitcase in the car.  After surgery it may be brought to your room.  For patients admitted to the hospital, discharge time will be determined by your treatment team.  Patients discharged the day of surgery will not be allowed to drive home.   Special instructions:  [place]- Preparing For Surgery  Before surgery, you can play an important role. Because skin is not sterile, your skin needs to be as free of germs as possible. You can reduce the number of germs on your skin by washing with CHG (chlorahexidine gluconate) Soap before surgery.  CHG is an antiseptic cleaner which kills germs and bonds with the skin to continue killing germs even after washing.  Please do not use if you have an  allergy to CHG or antibacterial soaps. If your skin becomes reddened/irritated stop using the CHG.  Do not shave (including legs and underarms) for at least 48 hours prior to first CHG shower. It is OK to shave your face.  Please follow these instructions carefully.   1. Shower the NIGHT BEFORE SURGERY and the MORNING OF SURGERY with CHG.   2. If you chose to wash your hair, wash your hair first as usual with your normal shampoo.  3. After you shampoo, rinse your hair and body thoroughly to remove the shampoo.  4. Use CHG as you would any other liquid soap. You can apply CHG directly to the skin and wash gently with a scrungie or a clean washcloth.   5. Apply the CHG Soap to your body ONLY FROM THE NECK DOWN.  Do not use on open wounds or open sores. Avoid contact with your eyes, ears, mouth and genitals (private parts). Wash genitals (private parts) with your normal soap.  6. Wash thoroughly, paying special attention to the area where your surgery will be performed.  7. Thoroughly rinse your body with warm water from the neck down.  8. DO NOT shower/wash with your normal soap after using and rinsing off the CHG Soap.  9. Pat yourself dry with a CLEAN TOWEL.   10. Wear CLEAN PAJAMAS   11. Place CLEAN SHEETS on your bed the night of your first shower and DO NOT SLEEP WITH  PETS.    Day of Surgery: Do not apply any deodorants/lotions. Please wear clean clothes to the hospital/surgery center.     Please read over the following fact sheets that you were given. Pain Booklet, Coughing and Deep Breathing and Surgical Site Infection Prevention

## 2016-11-05 ENCOUNTER — Encounter (HOSPITAL_COMMUNITY): Payer: Self-pay

## 2016-11-05 ENCOUNTER — Encounter (HOSPITAL_COMMUNITY)
Admission: RE | Admit: 2016-11-05 | Discharge: 2016-11-05 | Disposition: A | Discharge status: Home / Self Care | Payer: BLUE CROSS/BLUE SHIELD | Source: Ambulatory Visit | Attending: Interventional Radiology | Admitting: Interventional Radiology

## 2016-11-05 DIAGNOSIS — D446 Neoplasm of uncertain behavior of carotid body: Secondary | ICD-10-CM | POA: Diagnosis not present

## 2016-11-05 DIAGNOSIS — O1490 Unspecified pre-eclampsia, unspecified trimester: Secondary | ICD-10-CM

## 2016-11-05 HISTORY — DX: Unspecified pre-eclampsia, unspecified trimester: O14.90

## 2016-11-05 LAB — URINALYSIS, ROUTINE W REFLEX MICROSCOPIC
BACTERIA UA: NONE SEEN
Bilirubin Urine: NEGATIVE
Glucose, UA: NEGATIVE mg/dL
Ketones, ur: NEGATIVE mg/dL
Leukocytes, UA: NEGATIVE
NITRITE: NEGATIVE
PROTEIN: NEGATIVE mg/dL
Specific Gravity, Urine: 1.025 (ref 1.005–1.030)
pH: 5 (ref 5.0–8.0)

## 2016-11-05 LAB — PROTIME-INR
INR: 0.94
PROTHROMBIN TIME: 12.6 s (ref 11.4–15.2)

## 2016-11-05 LAB — CBC
HEMATOCRIT: 41.1 % (ref 36.0–46.0)
Hemoglobin: 12.8 g/dL (ref 12.0–15.0)
MCH: 27.4 pg (ref 26.0–34.0)
MCHC: 31.1 g/dL (ref 30.0–36.0)
MCV: 88 fL (ref 78.0–100.0)
PLATELETS: 298 10*3/uL (ref 150–400)
RBC: 4.67 MIL/uL (ref 3.87–5.11)
RDW: 14.5 % (ref 11.5–15.5)
WBC: 7 10*3/uL (ref 4.0–10.5)

## 2016-11-05 LAB — COMPREHENSIVE METABOLIC PANEL
ALT: 23 U/L (ref 14–54)
ANION GAP: 9 (ref 5–15)
AST: 25 U/L (ref 15–41)
Albumin: 4 g/dL (ref 3.5–5.0)
Alkaline Phosphatase: 62 U/L (ref 38–126)
BILIRUBIN TOTAL: 0.5 mg/dL (ref 0.3–1.2)
BUN: 13 mg/dL (ref 6–20)
CHLORIDE: 108 mmol/L (ref 101–111)
CO2: 22 mmol/L (ref 22–32)
Calcium: 9.2 mg/dL (ref 8.9–10.3)
Creatinine, Ser: 1.06 mg/dL — ABNORMAL HIGH (ref 0.44–1.00)
Glucose, Bld: 93 mg/dL (ref 65–99)
POTASSIUM: 4.1 mmol/L (ref 3.5–5.1)
Sodium: 139 mmol/L (ref 135–145)
TOTAL PROTEIN: 6.6 g/dL (ref 6.5–8.1)

## 2016-11-05 LAB — TYPE AND SCREEN
ABO/RH(D): B POS
ANTIBODY SCREEN: NEGATIVE

## 2016-11-05 LAB — SURGICAL PCR SCREEN
MRSA, PCR: NEGATIVE
Staphylococcus aureus: NEGATIVE

## 2016-11-05 LAB — APTT: aPTT: 30 seconds (ref 24–36)

## 2016-11-05 LAB — HCG, SERUM, QUALITATIVE: PREG SERUM: NEGATIVE

## 2016-11-06 ENCOUNTER — Encounter (HOSPITAL_COMMUNITY): Payer: Self-pay

## 2016-11-06 NOTE — Progress Notes (Signed)
Anesthesia chart review: Patient is a 36 year old female scheduled for cerebral arteriogram with embolization of carotid body tumor on 11/08/2016 by Dr. Estanislado Pandy. 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 She needs embolization of mass prior to vascular surgery performing right carotid body resection (scheduled for 11/09/16 by Dr. Trula Slade).   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  - PCP is Dr. Helane Rima with [facility] ([facility]). - HEM-ONC is Dr. Leodis Rains, last visit 06/05/16. - Vascular surgeon is Dr. Theotis Burrow. - ENT is Dr. Melida Quitter. He is in aggreement with vascular surgery regarding proceeding with surgical resection of the right-sided mass with preoperative embolization.  Meds include loratadine, MVI.  BP (!) 127/95   Pulse 93   Temp 37 C   Resp 20   Ht 4' 11.5" (1.511 m)   Wt 165 lb 1.6 oz (74.9 kg)   LMP 11/01/2016 (Exact Date)   SpO2 99%   BMI 32.79 kg/m   CTA neck 08/31/16: IMPRESSION: 1. Large 3.9 cm hypervascular mass within the right carotid space, splaying the proximal right ICA and ECA, and with numerous small recur did vessels extending from the right thoracic inlet to the mass. This lesion and the associated vessel recruitment have progressed since 2011 (1.5 cm mass at that time). 2. Small but very similar contralateral left carotid space hypervascular lesion measuring 1.2 cm and situated between the proximal right ICA and ECA. This lesion appears to be without vessel recruitment, and was 1-2 mm in 2011. 3. Chronic right peritracheal soft tissue mass, thought related to Castleman disease, which was much more hypervascular in 2015 and before, and on a 07/10/2013 CT demonstrated similar  surrounding feeding vessels as in #1. 4. Lastly, in recent years there have been several new reports of histology proven Castleman disease of the carotid space imitating carotid body tumor ( [employer] ). 5. Therefore, there is a a differential diagnosis of the bilateral carotid space lesions in this case: Carotid Body Tumor (paraganglioma) versus unusual manifestation of Castleman Disease. And the latter may be more likely.  CT chest 07/11/16: IMPRESSION: 1. Interval stability of hypervascular right paratracheal lymphadenopathy, compatible with Castleman's disease. 2. No new or progressive disease in the chest. 3. No acute abnormality.  Preoperative labs noted. Serum pregnancy test is negative.  If no acute changes then I anticipate that she can proceed as planned.  George Hugh [facility] Short Stay Center/Anesthesiology Phone [phone] 11/06/2016 12:50 PM

## 2016-11-07 ENCOUNTER — Other Ambulatory Visit: Payer: Self-pay | Admitting: Physician Assistant

## 2016-11-07 MED ORDER — VANCOMYCIN HCL IN DEXTROSE 1-5 GM/200ML-% IV SOLN
1000.0000 mg | INTRAVENOUS | Status: AC
  Administered 2016-11-08: 1000 mg via INTRAVENOUS
  Filled 2016-11-07: qty 200

## 2016-11-07 NOTE — Anesthesia Preprocedure Evaluation (Signed)
Anesthesia Evaluation  Patient identified by MRN, date of birth, ID band Patient awake    Reviewed: Allergy & Precautions, H&P , NPO status , Patient's Chart, lab work & pertinent test results  Airway Mallampati: III       Dental no notable dental hx. (+) Teeth Intact   Pulmonary  Mediastinal lymphadenopathy   Pulmonary exam normal breath sounds clear to auscultation       Cardiovascular hypertension, Normal cardiovascular exam Rhythm:Regular Rate:Normal  Hx/o PIH   Neuro/Psych negative neurological ROS  negative psych ROS   GI/Hepatic negative GI ROS, Neg liver ROS,   Endo/Other  negative endocrine ROS  Renal/GU negative Renal ROS  negative genitourinary   Musculoskeletal negative musculoskeletal ROS (+)   Abdominal (+) + obese,   Peds  Hematology  (+) Blood dyscrasia, , Castleman's disease   Anesthesia Other Findings   Reproductive/Obstetrics Undesired fertility                             Anesthesia Physical  Anesthesia Plan  ASA: II  Anesthesia Plan: General   Post-op Pain Management:    Induction: Intravenous  PONV Risk Score and Plan: 2 and Ondansetron, Dexamethasone and Treatment may vary due to age or medical condition  Airway Management Planned: Oral ETT  Additional Equipment:   Intra-op Plan:   Post-operative Plan: Extubation in OR  Informed Consent: I have reviewed the patients History and Physical, chart, labs and discussed the procedure including the risks, benefits and alternatives for the proposed anesthesia with the patient or authorized representative who has indicated his/her understanding and acceptance.   Dental advisory given  Plan Discussed with: CRNA, Anesthesiologist and Surgeon  Anesthesia Plan Comments: (Castleman disease (CD) is a rare disease of lymph nodes and related tissues.It was first described by Dr. Marland Kitchen Castleman in the 32s. It  is also known as Castleman's disease, giant lymph node hyperplasia, and angiofollicular lymph node hyperplasia (AFH). CD is not cancer but a lymphoproliferative disorder.)        Anesthesia Quick Evaluation

## 2016-11-08 ENCOUNTER — Ambulatory Visit (HOSPITAL_COMMUNITY)
Admission: RE | Admit: 2016-11-08 | Discharge: 2016-11-08 | Disposition: A | Discharge status: Home / Self Care | Payer: BLUE CROSS/BLUE SHIELD | Source: Ambulatory Visit | Attending: Surgery | Admitting: Surgery

## 2016-11-08 ENCOUNTER — Encounter (HOSPITAL_COMMUNITY): Payer: Self-pay | Admitting: Certified Registered Nurse Anesthetist

## 2016-11-08 ENCOUNTER — Encounter (HOSPITAL_COMMUNITY)
Admission: RE | Disposition: A | Discharge status: Home / Self Care | Payer: Self-pay | Source: Ambulatory Visit | Attending: Interventional Radiology

## 2016-11-08 ENCOUNTER — Inpatient Hospital Stay (HOSPITAL_COMMUNITY)
Admission: RE | Admit: 2016-11-08 | Discharge: 2016-11-10 | DRG: 026 | Disposition: A | Discharge status: Home / Self Care | Payer: BLUE CROSS/BLUE SHIELD | Source: Ambulatory Visit | Attending: Surgery | Admitting: Surgery

## 2016-11-08 ENCOUNTER — Inpatient Hospital Stay (HOSPITAL_COMMUNITY): Payer: BLUE CROSS/BLUE SHIELD | Admitting: Certified Registered Nurse Anesthetist

## 2016-11-08 ENCOUNTER — Inpatient Hospital Stay (HOSPITAL_COMMUNITY): Payer: BLUE CROSS/BLUE SHIELD | Admitting: Vascular Surgery

## 2016-11-08 DIAGNOSIS — D47Z2 Castleman disease: Secondary | ICD-10-CM | POA: Diagnosis present

## 2016-11-08 DIAGNOSIS — Z881 Allergy status to other antibiotic agents status: Secondary | ICD-10-CM

## 2016-11-08 DIAGNOSIS — Z6833 Body mass index (BMI) 33.0-33.9, adult: Secondary | ICD-10-CM

## 2016-11-08 DIAGNOSIS — Z885 Allergy status to narcotic agent status: Secondary | ICD-10-CM

## 2016-11-08 DIAGNOSIS — H9192 Unspecified hearing loss, left ear: Secondary | ICD-10-CM | POA: Diagnosis present

## 2016-11-08 DIAGNOSIS — Z880 Allergy status to penicillin: Secondary | ICD-10-CM | POA: Diagnosis not present

## 2016-11-08 DIAGNOSIS — Z8759 Personal history of other complications of pregnancy, childbirth and the puerperium: Secondary | ICD-10-CM | POA: Diagnosis not present

## 2016-11-08 DIAGNOSIS — E669 Obesity, unspecified: Secondary | ICD-10-CM | POA: Diagnosis present

## 2016-11-08 DIAGNOSIS — D446 Neoplasm of uncertain behavior of carotid body: Secondary | ICD-10-CM | POA: Diagnosis present

## 2016-11-08 DIAGNOSIS — I6529 Occlusion and stenosis of unspecified carotid artery: Secondary | ICD-10-CM | POA: Diagnosis present

## 2016-11-08 HISTORY — PX: IR ANGIO INTRA EXTRACRAN SEL COM CAROTID INNOMINATE BILAT MOD SED: IMG5360

## 2016-11-08 HISTORY — PX: IR ANGIOGRAM SELECTIVE EACH ADDITIONAL VESSEL: IMG667

## 2016-11-08 HISTORY — PX: IR ANGIO EXTERNAL CAROTID SEL EXT CAROTID UNI R MOD SED: IMG5371

## 2016-11-08 HISTORY — PX: IR ANGIO VERTEBRAL SEL SUBCLAVIAN INNOMINATE UNI R MOD SED: IMG5365

## 2016-11-08 HISTORY — PX: IR TRANSCATH/EMBOLIZ: IMG695

## 2016-11-08 HISTORY — PX: IR ANGIO VERTEBRAL SEL VERTEBRAL UNI L MOD SED: IMG5367

## 2016-11-08 HISTORY — PX: IR ANGIOGRAM FOLLOW UP STUDY: IMG697

## 2016-11-08 HISTORY — PX: OTHER SURGICAL HISTORY: SHX169

## 2016-11-08 HISTORY — PX: RADIOLOGY WITH ANESTHESIA: SHX6223

## 2016-11-08 LAB — GLUCOSE, CAPILLARY: GLUCOSE-CAPILLARY: 108 mg/dL — AB (ref 65–99)

## 2016-11-08 LAB — POCT ACTIVATED CLOTTING TIME
ACTIVATED CLOTTING TIME: 197 s
ACTIVATED CLOTTING TIME: 180 s

## 2016-11-08 SURGERY — RADIOLOGY WITH ANESTHESIA
Anesthesia: General | Laterality: Right

## 2016-11-08 MED ORDER — MIDAZOLAM HCL 5 MG/5ML IJ SOLN
INTRAMUSCULAR | Status: DC | PRN
  Administered 2016-11-08: 2 mg via INTRAVENOUS

## 2016-11-08 MED ORDER — LACTATED RINGERS IV SOLN
INTRAVENOUS | Status: DC
  Administered 2016-11-08 (×2): via INTRAVENOUS

## 2016-11-08 MED ORDER — CHLORHEXIDINE GLUCONATE 4 % EX LIQD: 60.0000 mL | Freq: Once | CUTANEOUS | Status: DC

## 2016-11-08 MED ORDER — LIDOCAINE HCL (PF) 1 % IJ SOLN
INTRAMUSCULAR | Status: DC | PRN
  Administered 2016-11-08: 10 mL

## 2016-11-08 MED ORDER — CLEVIDIPINE BUTYRATE 0.5 MG/ML IV EMUL
0.0000 mg/h | INTRAVENOUS | Status: DC
  Administered 2016-11-08: 2 mg/h via INTRAVENOUS
  Filled 2016-11-08 (×3): qty 50

## 2016-11-08 MED ORDER — LIDOCAINE 2% (20 MG/ML) 5 ML SYRINGE
INTRAMUSCULAR | Status: DC | PRN
  Administered 2016-11-08: 60 mg via INTRAVENOUS

## 2016-11-08 MED ORDER — FENTANYL CITRATE (PF) 100 MCG/2ML IJ SOLN
INTRAMUSCULAR | Status: DC | PRN
  Administered 2016-11-08: 25 ug via INTRAVENOUS
  Administered 2016-11-08 (×2): 50 ug via INTRAVENOUS
  Administered 2016-11-08: 25 ug via INTRAVENOUS
  Administered 2016-11-08: 50 ug via INTRAVENOUS

## 2016-11-08 MED ORDER — LIDOCAINE HCL (PF) 1 % IJ SOLN
INTRAMUSCULAR | Status: AC
  Filled 2016-11-08: qty 30

## 2016-11-08 MED ORDER — SODIUM CHLORIDE 0.9 % IV SOLN: INTRAVENOUS | Status: DC

## 2016-11-08 MED ORDER — GLYCOPYRROLATE 0.2 MG/ML IJ SOLN
INTRAMUSCULAR | Status: DC | PRN
  Administered 2016-11-08: 0.6 mg via INTRAVENOUS

## 2016-11-08 MED ORDER — PHENYLEPHRINE HCL 10 MG/ML IJ SOLN
INTRAMUSCULAR | Status: DC | PRN
  Administered 2016-11-08: 40 ug/min via INTRAVENOUS

## 2016-11-08 MED ORDER — VANCOMYCIN HCL 1000 MG IV SOLR: 1000.0000 mg | INTRAVENOUS | Status: DC

## 2016-11-08 MED ORDER — ONDANSETRON HCL 4 MG/2ML IJ SOLN: 4.0000 mg | Freq: Four times a day (QID) | INTRAMUSCULAR | Status: DC | PRN

## 2016-11-08 MED ORDER — MEPERIDINE HCL 25 MG/ML IJ SOLN
6.2500 mg | INTRAMUSCULAR | Status: DC | PRN
Start: 2016-11-08 — End: 2016-11-08

## 2016-11-08 MED ORDER — VANCOMYCIN HCL IN DEXTROSE 1-5 GM/200ML-% IV SOLN
1000.0000 mg | INTRAVENOUS | Status: AC
  Administered 2016-11-09: 1000 mg via INTRAVENOUS
  Filled 2016-11-08: qty 200

## 2016-11-08 MED ORDER — IOPAMIDOL (ISOVUE-300) INJECTION 61%
INTRAVENOUS | Status: AC
  Administered 2016-11-08: 75 mL
  Filled 2016-11-08: qty 150

## 2016-11-08 MED ORDER — ONDANSETRON HCL 4 MG/2ML IJ SOLN: 4.0000 mg | Freq: Once | INTRAMUSCULAR | Status: DC | PRN

## 2016-11-08 MED ORDER — NITROGLYCERIN 1 MG/10 ML FOR IR/CATH LAB
INTRA_ARTERIAL | Status: AC
  Filled 2016-11-08: qty 10

## 2016-11-08 MED ORDER — LACTATED RINGERS IV SOLN
INTRAVENOUS | Status: DC | PRN
  Administered 2016-11-08 (×2): via INTRAVENOUS

## 2016-11-08 MED ORDER — IOPAMIDOL (ISOVUE-300) INJECTION 61%
INTRAVENOUS | Status: AC
  Filled 2016-11-08: qty 150

## 2016-11-08 MED ORDER — PHENYLEPHRINE 40 MCG/ML (10ML) SYRINGE FOR IV PUSH (FOR BLOOD PRESSURE SUPPORT)
PREFILLED_SYRINGE | INTRAVENOUS | Status: DC | PRN
  Administered 2016-11-08: 40 ug via INTRAVENOUS
  Administered 2016-11-08: 80 ug via INTRAVENOUS

## 2016-11-08 MED ORDER — ONDANSETRON HCL 4 MG/2ML IJ SOLN
INTRAMUSCULAR | Status: DC | PRN
  Administered 2016-11-08: 4 mg via INTRAVENOUS

## 2016-11-08 MED ORDER — PROPOFOL 10 MG/ML IV BOLUS
INTRAVENOUS | Status: DC | PRN
  Administered 2016-11-08: 150 mg via INTRAVENOUS
  Administered 2016-11-08: 20 mg via INTRAVENOUS

## 2016-11-08 MED ORDER — LACTATED RINGERS IV SOLN: INTRAVENOUS | Status: DC | PRN

## 2016-11-08 MED ORDER — DEXAMETHASONE SODIUM PHOSPHATE 10 MG/ML IJ SOLN
INTRAMUSCULAR | Status: DC | PRN
  Administered 2016-11-08: 5 mg via INTRAVENOUS

## 2016-11-08 MED ORDER — SODIUM CHLORIDE 0.9 % IJ SOLN
INTRAVENOUS | Status: DC | PRN
  Administered 2016-11-08 (×2): 25 ug via INTRA_ARTERIAL

## 2016-11-08 MED ORDER — NEOSTIGMINE METHYLSULFATE 10 MG/10ML IV SOLN
INTRAVENOUS | Status: DC | PRN
  Administered 2016-11-08: 5 mg via INTRAVENOUS

## 2016-11-08 MED ORDER — HEPARIN SODIUM (PORCINE) 1000 UNIT/ML IJ SOLN
INTRAMUSCULAR | Status: DC | PRN
  Administered 2016-11-08: 1000 [IU] via INTRAVENOUS
  Administered 2016-11-08: 3000 [IU] via INTRAVENOUS

## 2016-11-08 MED ORDER — SODIUM CHLORIDE 0.9 % IV SOLN
INTRAVENOUS | Status: DC
  Administered 2016-11-09 (×2): via INTRAVENOUS

## 2016-11-08 MED ORDER — ROCURONIUM BROMIDE 10 MG/ML (PF) SYRINGE
PREFILLED_SYRINGE | INTRAVENOUS | Status: DC | PRN
  Administered 2016-11-08 (×2): 20 mg via INTRAVENOUS
  Administered 2016-11-08: 50 mg via INTRAVENOUS

## 2016-11-08 MED ORDER — FENTANYL CITRATE (PF) 100 MCG/2ML IJ SOLN: 25.0000 ug | INTRAMUSCULAR | Status: DC | PRN

## 2016-11-08 NOTE — Progress Notes (Signed)
    Subjective  -  S/p embolization today   Assessment/Plan:    Patient underwent successful embolization of feeding branches to her right carotid body tumor.  Plan is for excision tomorrow in the OR  Wynetta Seith, Rock Nephew 11/08/2016 4:17 PM --  Vitals:   11/08/16 1515 11/08/16 1519  BP:  108/78  Pulse: 78 82  Resp: 17 18  Temp:      Intake/Output Summary (Last 24 hours) at 11/08/16 1617 Last data filed at 11/08/16 1448  Gross per 24 hour  Intake             2000 ml  Output              975 ml  Net             1025 ml     Laboratory CBC    Component Value Date/Time   WBC 7.0 11/05/2016 0900   HGB 12.8 11/05/2016 0900   HGB 12.8 06/05/2016 0920   HCT 41.1 11/05/2016 0900   HCT 40.9 06/05/2016 0920   PLT 298 11/05/2016 0900   PLT 332 06/05/2016 0920    BMET    Component Value Date/Time   NA 139 11/05/2016 0900   NA 139 06/05/2016 0920   K 4.1 11/05/2016 0900   K 4.5 06/05/2016 0920   CL 108 11/05/2016 0900   CO2 22 11/05/2016 0900   CO2 25 06/05/2016 0920   GLUCOSE 93 11/05/2016 0900   GLUCOSE 96 06/05/2016 0920   BUN 13 11/05/2016 0900   BUN 11.5 06/05/2016 0920   CREATININE 1.06 (H) 11/05/2016 0900   CREATININE 0.8 06/05/2016 0920   CALCIUM 9.2 11/05/2016 0900   CALCIUM 9.7 06/05/2016 0920   GFRNONAA >60 11/05/2016 0900   GFRAA >60 11/05/2016 0900    COAG Lab Results  Component Value Date   INR 0.94 11/05/2016   INR 0.96 04/30/2013   INR 1.05 06/20/2009   No results found for: PTT  Antibiotics Anti-infectives    Start     Dose/Rate Route Frequency Ordered Stop   11/08/16 0900  vancomycin (VANCOCIN) IVPB 1000 mg/200 mL premix     1,000 mg 200 mL/hr over 60 Minutes Intravenous To ShortStay Surgical 11/07/16 0849 11/08/16 1303   11/08/16 0615  vancomycin (VANCOCIN) 1,000 mg in sodium chloride 0.9 % 250 mL IVPB  Status:  Discontinued     1,000 mg 250 mL/hr over 60 Minutes Intravenous To Radiology 11/08/16 0604 11/08/16 0605       V. Leia Alf, M.D. [facility] [place] Office: [phone] Pager:  [phone]

## 2016-11-08 NOTE — Anesthesia Procedure Notes (Signed)
Procedure Name: Intubation Date/Time: 11/08/2016 12:01 PM Performed by: Everlean Cherry A Pre-anesthesia Checklist: Patient identified, Emergency Drugs available, Suction available and Patient being monitored Patient Re-evaluated:Patient Re-evaluated prior to induction Oxygen Delivery Method: Circle system utilized Preoxygenation: Pre-oxygenation with 100% oxygen Induction Type: IV induction Ventilation: Mask ventilation without difficulty Laryngoscope Size: Miller and 2 Grade View: Grade I Tube type: Oral Tube size: 7.5 mm Number of attempts: 1 Airway Equipment and Method: Stylet Placement Confirmation: ETT inserted through vocal cords under direct vision,  breath sounds checked- equal and bilateral and positive ETCO2 Secured at: 22 cm Tube secured with: Tape Dental Injury: Teeth and Oropharynx as per pre-operative assessment

## 2016-11-08 NOTE — H&P (Signed)
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 Harold Barban  Supervising Physician: Luanne Bras  Patient Status: [facility] - Out-pt  HPI: Tracy Downs is a 36 y.o. female who was referred to Dr. Trula Slade by Dr. 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  On her staging CT scans she was found to have a lesion in her right carotid artery.  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  After further evaluation, Dr. Trula Slade has planned for resection of this tumor, but has asked Dr. Estanislado Pandy to evaluate her for a preoperative embolization to decrease the risk of intra-operative bleeding.  The patient denies any complaints.  She presents today for this procedure.    Past Medical History:  Past Medical History:  Diagnosis Date  . 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 Deafness in left ear   . Pre-eclampsia 11/05/2016   during pregnancy only    Past Surgical History:  Past Surgical History:  Procedure Laterality Date  . castleman disease biopsy    . LAPAROSCOPIC BILATERAL SALPINGECTOMY Bilateral 06/05/2013   Procedure: LAPAROSCOPIC BILATERAL SALPINGECTOMY;  Surgeon: Lahoma Crocker, MD;  Location: [facility];  Service: Gynecology;  Laterality: Bilateral;  . LEEP      Family History:  Family History  Problem Relation Age of Onset  . Cancer Mother        breast  . Kidney disease Brother   . Diabetes Maternal Grandmother   . Hypertension Maternal Grandmother     Social History:  reports that she has never smoked. She has never used smokeless tobacco. She reports that she drinks alcohol. She reports that she does not use drugs.  Allergies:  Allergies  Allergen Reactions  . Hydrocodone Other (See Comments)    Urinary retention  . Amoxicillin Rash    Childhood allergy   . Penicillins Rash    Childhood allergy  Has patient had a PCN reaction causing immediate rash, facial/tongue/throat swelling, SOB or  lightheadedness with hypotension: Yes Has patient had a PCN reaction causing severe rash involving mucus membranes or skin necrosis: Unknown Has patient had a PCN reaction that required hospitalization: No Has patient had a PCN reaction occurring within the last 10 years: No If all of the above answers are "NO", then may proceed with Cephalosporin use.     Medications: Medications reviewed in epic  Please HPI for pertinent positives, otherwise complete 10 system ROS negative.  Mallampati Score: MD Evaluation Airway: WNL Heart: WNL Abdomen: WNL Chest/ Lungs: WNL ASA  Classification: 2 Mallampati/Airway Score: Two  Physical Exam: BP 111/76   Pulse 85   Temp 98.3 F (36.8 C) (Oral)   Resp 20   Ht 4\' 11"  (1.499 m)   Wt 165 lb 1 oz (74.9 kg)   LMP 11/01/2016 (Exact Date)   SpO2 100%   BMI 33.34 kg/m  Body mass index is 33.34 kg/m. General: pleasant, WD, WN black female who is laying in bed in NAD HEENT: head is normocephalic, atraumatic.  Sclera are noninjected.  PERRL.  Ears and nose without any masses or lesions.  Mouth is pink and moist Heart: regular, rate, and rhythm.  Normal s1,s2. No obvious murmurs, gallops, or rubs noted.  Palpable radial and pedal pulses bilaterally Lungs: CTAB, no wheezes, rhonchi, or rales noted.  Respiratory effort nonlabored Abd: soft, NT, ND, +BS, no masses, hernias, or organomegaly Psych: A&Ox3 with an appropriate affect.   Labs: See labs from 7/23, preop  labs  Imaging: No results found.  Assessment/Plan 1. 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  If we are able to proceed we will then put the patient under GETA to perform an embolization of this tumor.  Her labs and vitals have been reviewed.  The procedure as well as risks and complications have been discussed with her.  She is agreeable to proceed. Risks and Benefits discussed  with the patient including, but not limited to bleeding, infection, vascular injury or contrast induced renal failure, CVA. All of the patient's questions were answered, patient is agreeable to proceed. Consent signed and in chart.   Thank you for this interesting consult.  I greatly enjoyed meeting Tracy Downs and look forward to participating in their care.  A copy of this report was sent to the requesting provider on this date.  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

## 2016-11-08 NOTE — Anesthesia Procedure Notes (Signed)
Arterial Line Insertion Start/End7/26/2018 7:05 AM, 11/08/2016 7:45 AM Performed by: Sherre Lain, CRNA  Patient location: Pre-op. Preanesthetic checklist: patient identified, IV checked, site marked, risks and benefits discussed, surgical consent, monitors and equipment checked, pre-op evaluation, timeout performed and anesthesia consent Lidocaine 1% used for infiltration Right, radial was placed Catheter size: 20 G Hand hygiene performed  and maximum sterile barriers used  Allen's test indicative of satisfactory collateral circulation Attempts: 4 (Attempted x2 on left radial and x2 on right radial.) Procedure performed without using ultrasound guided technique. Following insertion, dressing applied and Biopatch. Post procedure assessment: normal and unchanged  Patient tolerated the procedure well with no immediate complications.

## 2016-11-08 NOTE — Anesthesia Procedure Notes (Signed)
Procedure Name: MAC Date/Time: 11/08/2016 11:08 AM Performed by: Everlean Cherry A Pre-anesthesia Checklist: Patient identified, Emergency Drugs available, Suction available and Patient being monitored Patient Re-evaluated:Patient Re-evaluated prior to induction Oxygen Delivery Method: Nasal cannula

## 2016-11-08 NOTE — Transfer of Care (Addendum)
Immediate Anesthesia Transfer of Care Note  Patient: Tracy Downs  Procedure(s) Performed: Procedure(s): Carotid artery body tumor embolization (Right)  Patient Location: PACU  Anesthesia Type:General  Level of Consciousness: awake, alert , oriented and patient cooperative  Airway & Oxygen Therapy: Patient Spontanous Breathing and Patient connected to nasal cannula oxygen  Post-op Assessment: Report given to RN, Post -op Vital signs reviewed and stable and Patient moving all extremities X 4, able to stick tongue out midline  Post vital signs: Reviewed and stable  Last Vitals:  Vitals:   11/08/16 0640 11/08/16 1434  BP: 111/76 (P) 105/84  Pulse: 85   Resp: 20 17  Temp: 36.8 C (P) 36.9 C    Last Pain:  Vitals:   11/08/16 1434  TempSrc:   PainSc: (P) 0-No pain      Patients Stated Pain Goal: 3 (33/82/50 5397)  Complications: No apparent anesthesia complications

## 2016-11-08 NOTE — Procedures (Signed)
S/P 4 vessel cerebral arteriogram RT CFA approach. Findings. 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 Superselective embolization performed of 4 main feeders from ascending pharyngeal  artery and one from the facial artery with obliteration of 80 % of tumor  blush using 250 to 355 and 355 to 500 PVA particles.

## 2016-11-08 NOTE — Progress Notes (Signed)
eLink Physician-Brief Progress Note Patient Name: Tracy Downs DOB: Dec 05, 1980 MRN: [id number]   Date of Service  11/08/2016  HPI/Events of Note  Patient admitted with carotid body lesion. Status post interventional radiology embolization of feeder vessels in preparation of operative excision. Camera shows patient resting comfortably with family at bedside.   eICU Interventions  Continuing current plan of care as per admitting service.      Intervention Category Evaluation Type: New Patient Evaluation  Tera Partridge 11/08/2016, 7:07 PM

## 2016-11-08 NOTE — Progress Notes (Signed)
Pt transferred to PACU. Groin level 0- pulses same as earlier. Report given. IR team signing off

## 2016-11-08 NOTE — Progress Notes (Signed)
Patient ID: Tracy Downs, female   DOB: [birth year], 37 y.o.   MRN: [id number] INR. Patient extubated without difficulty. Maintaining O2 sats.Denies any H/A,N/V,visual ,motor  or sensory symptoms.. O/E VSS. AAoOx3.Speech and comp normal. No facial asymmetry. Tongue midline.. No pronation drift. RT groin soft. No hematoma. Pulses DP 2+,and PTs 1+. S.Lynise Porr MD

## 2016-11-09 ENCOUNTER — Encounter (HOSPITAL_COMMUNITY): Payer: Self-pay | Admitting: General Practice

## 2016-11-09 ENCOUNTER — Inpatient Hospital Stay (HOSPITAL_COMMUNITY): Admission: RE | Admit: 2016-11-09 | Payer: BLUE CROSS/BLUE SHIELD | Source: Ambulatory Visit | Admitting: Surgery

## 2016-11-09 ENCOUNTER — Inpatient Hospital Stay (HOSPITAL_COMMUNITY): Payer: BLUE CROSS/BLUE SHIELD | Admitting: Certified Registered"

## 2016-11-09 ENCOUNTER — Encounter (HOSPITAL_COMMUNITY)
Admission: RE | Disposition: A | Discharge status: Home / Self Care | Payer: Self-pay | Source: Ambulatory Visit | Attending: Interventional Radiology

## 2016-11-09 DIAGNOSIS — D446 Neoplasm of uncertain behavior of carotid body: Secondary | ICD-10-CM

## 2016-11-09 HISTORY — PX: CAROTID BODY TUMOR EXCISION: SHX5156

## 2016-11-09 LAB — BASIC METABOLIC PANEL
Anion gap: 9 (ref 5–15)
BUN: 6 mg/dL (ref 6–20)
CALCIUM: 8.6 mg/dL — AB (ref 8.9–10.3)
CO2: 21 mmol/L — AB (ref 22–32)
CREATININE: 0.68 mg/dL (ref 0.44–1.00)
Chloride: 108 mmol/L (ref 101–111)
GFR calc Af Amer: >60 mL/min (ref >60)
GLUCOSE: 112 mg/dL — AB (ref 65–99)
Potassium: 3.9 mmol/L (ref 3.5–5.1)
Sodium: 138 mmol/L (ref 135–145)

## 2016-11-09 LAB — CBC WITH DIFFERENTIAL/PLATELET
BASOS ABS: 0 10*3/uL (ref 0.0–0.1)
BASOS PCT: 0 %
EOS ABS: 0 10*3/uL (ref 0.0–0.7)
Eosinophils Relative: 0 %
HEMATOCRIT: 38.7 % (ref 36.0–46.0)
HEMOGLOBIN: 12 g/dL (ref 12.0–15.0)
Lymphocytes Relative: 12 %
Lymphs Abs: 1.4 10*3/uL (ref 0.7–4.0)
MCH: 26.8 pg (ref 26.0–34.0)
MCHC: 31 g/dL (ref 30.0–36.0)
MCV: 86.6 fL (ref 78.0–100.0)
MONOS PCT: 5 %
Monocytes Absolute: 0.6 10*3/uL (ref 0.1–1.0)
NEUTROS ABS: 10.2 10*3/uL — AB (ref 1.7–7.7)
NEUTROS PCT: 83 %
Platelets: 300 10*3/uL (ref 150–400)
RBC: 4.47 MIL/uL (ref 3.87–5.11)
RDW: 14.4 % (ref 11.5–15.5)
WBC: 12.2 10*3/uL — AB (ref 4.0–10.5)

## 2016-11-09 SURGERY — EXCISION, NEOPLASM, CAROTID BODY
Anesthesia: General | Site: Neck | Laterality: Right

## 2016-11-09 MED ORDER — EPHEDRINE 5 MG/ML INJ
INTRAVENOUS | Status: AC
  Filled 2016-11-09: qty 10

## 2016-11-09 MED ORDER — PROPOFOL 10 MG/ML IV BOLUS
INTRAVENOUS | Status: AC
  Filled 2016-11-09: qty 20

## 2016-11-09 MED ORDER — POTASSIUM CHLORIDE CRYS ER 20 MEQ PO TBCR: 20.0000 meq | EXTENDED_RELEASE_TABLET | Freq: Every day | ORAL | Status: DC | PRN

## 2016-11-09 MED ORDER — DEXAMETHASONE SODIUM PHOSPHATE 10 MG/ML IJ SOLN
INTRAMUSCULAR | Status: DC | PRN
  Administered 2016-11-09: 8 mg via INTRAVENOUS

## 2016-11-09 MED ORDER — LABETALOL HCL 5 MG/ML IV SOLN: 10.0000 mg | INTRAVENOUS | Status: DC | PRN

## 2016-11-09 MED ORDER — HYDRALAZINE HCL 20 MG/ML IJ SOLN: 5.0000 mg | INTRAMUSCULAR | Status: DC | PRN

## 2016-11-09 MED ORDER — 0.9 % SODIUM CHLORIDE (POUR BTL) OPTIME
TOPICAL | Status: DC | PRN
  Administered 2016-11-09: 2000 mL

## 2016-11-09 MED ORDER — ACETAMINOPHEN 325 MG RE SUPP
325.0000 mg | RECTAL | Status: DC | PRN
  Filled 2016-11-09: qty 2

## 2016-11-09 MED ORDER — ONDANSETRON HCL 4 MG/2ML IJ SOLN: 4.0000 mg | Freq: Once | INTRAMUSCULAR | Status: DC | PRN

## 2016-11-09 MED ORDER — SODIUM CHLORIDE 0.9 % IV SOLN
INTRAVENOUS | Status: DC | PRN
  Administered 2016-11-09: 09:00:00 500 mL

## 2016-11-09 MED ORDER — ACETAMINOPHEN 325 MG PO TABS: 325.0000 mg | ORAL_TABLET | ORAL | Status: DC | PRN

## 2016-11-09 MED ORDER — LIDOCAINE-EPINEPHRINE 1 %-1:100000 IJ SOLN
INTRAMUSCULAR | Status: DC | PRN
  Administered 2016-11-09: 2.5 mL via INTRADERMAL

## 2016-11-09 MED ORDER — FENTANYL CITRATE (PF) 100 MCG/2ML IJ SOLN
25.0000 ug | INTRAMUSCULAR | Status: DC | PRN
  Administered 2016-11-09 (×2): 50 ug via INTRAVENOUS

## 2016-11-09 MED ORDER — VANCOMYCIN HCL IN DEXTROSE 1-5 GM/200ML-% IV SOLN
1000.0000 mg | Freq: Two times a day (BID) | INTRAVENOUS | Status: AC
  Administered 2016-11-10: 1000 mg via INTRAVENOUS
  Filled 2016-11-09: qty 200

## 2016-11-09 MED ORDER — FENTANYL CITRATE (PF) 100 MCG/2ML IJ SOLN
INTRAMUSCULAR | Status: AC
  Administered 2016-11-09: 50 ug via INTRAVENOUS
  Filled 2016-11-09: qty 2

## 2016-11-09 MED ORDER — LACTATED RINGERS IV SOLN
INTRAVENOUS | Status: DC | PRN
  Administered 2016-11-09: 09:00:00 via INTRAVENOUS

## 2016-11-09 MED ORDER — PHENOL 1.4 % MT LIQD
1.0000 | OROMUCOSAL | Status: DC | PRN
  Filled 2016-11-09: qty 177

## 2016-11-09 MED ORDER — METOPROLOL TARTRATE 5 MG/5ML IV SOLN: 2.0000 mg | INTRAVENOUS | Status: DC | PRN

## 2016-11-09 MED ORDER — DOCUSATE SODIUM 100 MG PO CAPS
100.0000 mg | ORAL_CAPSULE | Freq: Every day | ORAL | Status: DC
  Filled 2016-11-09: qty 1

## 2016-11-09 MED ORDER — FENTANYL CITRATE (PF) 250 MCG/5ML IJ SOLN
INTRAMUSCULAR | Status: AC
  Filled 2016-11-09: qty 5

## 2016-11-09 MED ORDER — ONDANSETRON HCL 4 MG/2ML IJ SOLN
INTRAMUSCULAR | Status: AC
  Filled 2016-11-09: qty 2

## 2016-11-09 MED ORDER — SUGAMMADEX SODIUM 200 MG/2ML IV SOLN
INTRAVENOUS | Status: DC | PRN
  Administered 2016-11-09: 200 mg via INTRAVENOUS

## 2016-11-09 MED ORDER — LACTATED RINGERS IV SOLN
INTRAVENOUS | Status: DC
  Administered 2016-11-09: 08:00:00 via INTRAVENOUS

## 2016-11-09 MED ORDER — MORPHINE SULFATE (PF) 2 MG/ML IV SOLN: 2.0000 mg | INTRAVENOUS | Status: DC | PRN

## 2016-11-09 MED ORDER — FENTANYL CITRATE (PF) 100 MCG/2ML IJ SOLN
INTRAMUSCULAR | Status: DC | PRN
  Administered 2016-11-09 (×3): 50 ug via INTRAVENOUS
  Administered 2016-11-09: 100 ug via INTRAVENOUS

## 2016-11-09 MED ORDER — ASPIRIN EC 325 MG PO TBEC
325.0000 mg | DELAYED_RELEASE_TABLET | Freq: Every day | ORAL | Status: DC
  Filled 2016-11-09: qty 1

## 2016-11-09 MED ORDER — OXYCODONE-ACETAMINOPHEN 5-325 MG PO TABS
1.0000 | ORAL_TABLET | ORAL | Status: DC | PRN
  Administered 2016-11-09: 1 via ORAL
  Filled 2016-11-09: qty 1

## 2016-11-09 MED ORDER — LIDOCAINE 2% (20 MG/ML) 5 ML SYRINGE
INTRAMUSCULAR | Status: DC | PRN
  Administered 2016-11-09: 60 mg via INTRAVENOUS

## 2016-11-09 MED ORDER — PANTOPRAZOLE SODIUM 40 MG PO TBEC
40.0000 mg | DELAYED_RELEASE_TABLET | Freq: Every day | ORAL | Status: DC
  Administered 2016-11-09: 40 mg via ORAL
  Filled 2016-11-09: qty 1

## 2016-11-09 MED ORDER — ROCURONIUM BROMIDE 100 MG/10ML IV SOLN
INTRAVENOUS | Status: DC | PRN
  Administered 2016-11-09: 50 mg via INTRAVENOUS
  Administered 2016-11-09: 20 mg via INTRAVENOUS

## 2016-11-09 MED ORDER — MAGNESIUM SULFATE 2 GM/50ML IV SOLN: 2.0000 g | Freq: Every day | INTRAVENOUS | Status: DC | PRN

## 2016-11-09 MED ORDER — GUAIFENESIN-DM 100-10 MG/5ML PO SYRP
15.0000 mL | ORAL_SOLUTION | ORAL | Status: DC | PRN
  Filled 2016-11-09: qty 15

## 2016-11-09 MED ORDER — LIDOCAINE 2% (20 MG/ML) 5 ML SYRINGE
INTRAMUSCULAR | Status: AC
  Filled 2016-11-09: qty 5

## 2016-11-09 MED ORDER — ONDANSETRON HCL 4 MG/2ML IJ SOLN
INTRAMUSCULAR | Status: DC | PRN
  Administered 2016-11-09: 4 mg via INTRAVENOUS

## 2016-11-09 MED ORDER — DEXAMETHASONE SODIUM PHOSPHATE 10 MG/ML IJ SOLN
INTRAMUSCULAR | Status: AC
  Filled 2016-11-09: qty 1

## 2016-11-09 MED ORDER — SUCCINYLCHOLINE CHLORIDE 200 MG/10ML IV SOSY
PREFILLED_SYRINGE | INTRAVENOUS | Status: AC
  Filled 2016-11-09: qty 10

## 2016-11-09 MED ORDER — PHENYLEPHRINE 40 MCG/ML (10ML) SYRINGE FOR IV PUSH (FOR BLOOD PRESSURE SUPPORT)
PREFILLED_SYRINGE | INTRAVENOUS | Status: AC
  Filled 2016-11-09: qty 10

## 2016-11-09 MED ORDER — ROCURONIUM BROMIDE 10 MG/ML (PF) SYRINGE
PREFILLED_SYRINGE | INTRAVENOUS | Status: AC
  Filled 2016-11-09: qty 5

## 2016-11-09 MED ORDER — PROPOFOL 10 MG/ML IV BOLUS
INTRAVENOUS | Status: DC | PRN
  Administered 2016-11-09: 130 mg via INTRAVENOUS

## 2016-11-09 MED ORDER — ALUM & MAG HYDROXIDE-SIMETH 200-200-20 MG/5ML PO SUSP
15.0000 mL | ORAL | Status: DC | PRN
  Filled 2016-11-09: qty 30

## 2016-11-09 MED ORDER — SODIUM CHLORIDE 0.9 % IV SOLN: 500.0000 mL | Freq: Once | INTRAVENOUS | Status: DC | PRN

## 2016-11-09 SURGICAL SUPPLY — 55 items
ADH SKN CLS APL DERMABOND .7 (GAUZE/BANDAGES/DRESSINGS) ×1
CANISTER SUCT 3000ML PPV (MISCELLANEOUS) ×3 IMPLANT
CATH ROBINSON RED A/P 18FR (CATHETERS) ×3 IMPLANT
CATH SUCT 10FR WHISTLE TIP (CATHETERS) ×1 IMPLANT
CLIP TI MEDIUM 6 (CLIP) ×3 IMPLANT
CLIP TI WIDE RED SMALL 6 (CLIP) ×3 IMPLANT
CLIP VESOCCLUDE MED 6/CT (CLIP) ×3 IMPLANT
CLIP VESOCCLUDE SM WIDE 6/CT (CLIP) ×3 IMPLANT
CONT SPEC 4OZ CLIKSEAL STRL BL (MISCELLANEOUS) ×3 IMPLANT
CORD BIPOLAR FORCEPS 12FT (ELECTRODE) ×3 IMPLANT
COVER PROBE W GEL 5X96 (DRAPES) IMPLANT
CRADLE DONUT ADULT HEAD (MISCELLANEOUS) ×3 IMPLANT
DECANTER SPIKE VIAL GLASS SM (MISCELLANEOUS) ×2 IMPLANT
DERMABOND ADVANCED (GAUZE/BANDAGES/DRESSINGS) ×2
DERMABOND ADVANCED .7 DNX12 (GAUZE/BANDAGES/DRESSINGS) ×1 IMPLANT
DRAIN CHANNEL 15F RND FF W/TCR (WOUND CARE) IMPLANT
DRAIN CHANNEL 7F 3/4 FLAT (WOUND CARE) ×2 IMPLANT
DRAPE HALF SHEET 40X57 (DRAPES) ×2 IMPLANT
DRAPE INCISE IOBAN 66X45 STRL (DRAPES) ×3 IMPLANT
DRAPE ORTHO SPLIT 77X108 STRL (DRAPES) ×3
DRAPE SURG ORHT 6 SPLT 77X108 (DRAPES) ×1 IMPLANT
ELECT REM PT RETURN 9FT ADLT (ELECTROSURGICAL) ×3
ELECTRODE REM PT RTRN 9FT ADLT (ELECTROSURGICAL) ×1 IMPLANT
EVACUATOR SILICONE 100CC (DRAIN) IMPLANT
FORCEPS BIPOLAR SPETZLER 8 1.0 (NEUROSURGERY SUPPLIES) ×3 IMPLANT
GLOVE BIOGEL PI IND STRL 7.5 (GLOVE) ×1 IMPLANT
GLOVE BIOGEL PI INDICATOR 7.5 (GLOVE) ×2
GLOVE SURG SS PI 6.5 STRL IVOR (GLOVE) ×2 IMPLANT
GLOVE SURG SS PI 7.5 STRL IVOR (GLOVE) ×3 IMPLANT
GOWN STRL REUS W/ TWL LRG LVL3 (GOWN DISPOSABLE) ×2 IMPLANT
GOWN STRL REUS W/ TWL XL LVL3 (GOWN DISPOSABLE) ×1 IMPLANT
GOWN STRL REUS W/TWL LRG LVL3 (GOWN DISPOSABLE) ×6
GOWN STRL REUS W/TWL XL LVL3 (GOWN DISPOSABLE) ×3
HEMOSTAT SNOW SURGICEL 2X4 (HEMOSTASIS) IMPLANT
INSERT FOGARTY SM (MISCELLANEOUS) IMPLANT
KIT BASIN OR (CUSTOM PROCEDURE TRAY) ×3 IMPLANT
KIT ROOM TURNOVER OR (KITS) ×3 IMPLANT
KIT SHUNT ARGYLE CAROTID ART 6 (VASCULAR PRODUCTS) IMPLANT
MARKER SKIN DUAL TIP RULER LAB (MISCELLANEOUS) ×2 IMPLANT
NEEDLE HYPO 25GX1X1/2 BEV (NEEDLE) ×3 IMPLANT
NS IRRIG 1000ML POUR BTL (IV SOLUTION) ×9 IMPLANT
PACK CAROTID (CUSTOM PROCEDURE TRAY) ×3 IMPLANT
PAD ARMBOARD 7.5X6 YLW CONV (MISCELLANEOUS) ×6 IMPLANT
SHUNT CAROTID BYPASS 10 (VASCULAR PRODUCTS) IMPLANT
SHUNT CAROTID BYPASS 12FRX15.5 (VASCULAR PRODUCTS) IMPLANT
SUT ETHILON 3 0 PS 1 (SUTURE) IMPLANT
SUT PROLENE 6 0 BV (SUTURE) ×3 IMPLANT
SUT PROLENE 7 0 BV 1 (SUTURE) IMPLANT
SUT SILK 3 0 (SUTURE)
SUT SILK 3-0 18XBRD TIE 12 (SUTURE) IMPLANT
SUT VIC AB 3-0 SH 27 (SUTURE) ×6
SUT VIC AB 3-0 SH 27X BRD (SUTURE) ×2 IMPLANT
SUT VICRYL 4-0 PS2 18IN ABS (SUTURE) ×3 IMPLANT
SYR CONTROL 10ML LL (SYRINGE) ×6 IMPLANT
WATER STERILE IRR 1000ML POUR (IV SOLUTION) ×3 IMPLANT

## 2016-11-09 NOTE — Progress Notes (Signed)
   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  Dr. Redmond Baseman will decide on removal tomorrow Possible d/c tomorrow  Vitals:   11/09/16 1500 11/09/16 1525  BP: (!) 123/91   Pulse: 92   Resp: 12   Temp:  97.7 F (36.5 C)   V. Leia Alf, M.D. [facility] [place] Office: [phone] Pager:  [phone]

## 2016-11-09 NOTE — Anesthesia Procedure Notes (Signed)
Procedure Name: Intubation Date/Time: 11/09/2016 9:17 AM Performed by: Orlie Dakin Pre-anesthesia Checklist: Patient identified, Emergency Drugs available, Suction available, Patient being monitored and Timeout performed Patient Re-evaluated:Patient Re-evaluated prior to induction Oxygen Delivery Method: Circle system utilized Preoxygenation: Pre-oxygenation with 100% oxygen Induction Type: IV induction Ventilation: Oral airway inserted - appropriate to patient size and Mask ventilation without difficulty Laryngoscope Size: Miller and 3 Grade View: Grade I Tube type: Oral Tube size: 7.5 mm Number of attempts: 1 Airway Equipment and Method: Stylet Placement Confirmation: ETT inserted through vocal cords under direct vision,  positive ETCO2 and breath sounds checked- equal and bilateral Secured at: 24 cm Tube secured with: Tape Dental Injury: Teeth and Oropharynx as per pre-operative assessment  Comments: 4x4s bite block used.

## 2016-11-09 NOTE — H&P (Signed)
[facility] [place]  Patient name: Tracy Downs       MRN: [id number]        DOB: 06/11/1980        Sex: female   REASON FOR VISIT:    Follow-up  HISOTRY OF PRESENT ILLNESS:   Chastelyn S Vaughanis a 36 y.o.female, who is Referred for evaluation of a right carotid body tumor. This was seen on CT scan recently. It has been growing. Originally it was felt to be a lymph node, however now that it opens up the carotid bifurcation is favored a carotid body tumor. The patient does not suffer from any constitutional symptoms such as fever or chills or night sweats.  The patient does suffer from Castleman's disease. This is biopsy-proven.She has history of preeclampsia as well as deafness in her left ear.  She returns today for review of the CT angiogram.   PAST MEDICAL HISTORY:       Past Medical History:  Diagnosis Date  . Deafness in left ear   . Pre-eclampsia      FAMILY HISTORY:        Family History  Problem Relation Age of Onset  . Cancer Mother        breast  . Kidney disease Brother   . Diabetes Maternal Grandmother   . Hypertension Maternal Grandmother     SOCIAL HISTORY:          Social History   Substance Use Topics   . Smoking status: Never Smoker   . Smokeless tobacco: Never Used   . Alcohol use Yes     Comment: occasionally       ALLERGIES:   Allergies  Allergen Reactions  . Hydrocodone Other (See Comments)    Urinary retention  . Amoxicillin Rash  . Penicillins Rash     CURRENT MEDICATIONS:         Current Outpatient Prescriptions  Medication Sig Dispense Refill  . Multiple Vitamins-Minerals (MULTIPLE VITAMINS/WOMENS PO) Take by mouth.     No current facility-administered medications for this visit.     REVIEW OF SYSTEMS:   [X]  denotes positive finding, [ ]  denotes negative finding Cardiac  Comments:  Chest pain or chest  pressure:    Shortness of breath upon exertion:    Short of breath when lying flat:    Irregular heart rhythm:        Vascular    Pain in calf, thigh, or hip brought on by ambulation:    Pain in feet at night that wakes you up from your sleep:     Blood clot in your veins:    Leg swelling:         Pulmonary    Oxygen at home:    Productive cough:     Wheezing:         Neurologic    Sudden weakness in arms or legs:     Sudden numbness in arms or legs:     Sudden onset of difficulty speaking or slurred speech:    Temporary loss of vision in one eye:     Problems with dizziness:         Gastrointestinal    Blood in stool:     Vomited blood:         Genitourinary    Burning when urinating:     Blood in urine:        Psychiatric    Major depression:  Hematologic    Bleeding problems:    Problems with blood clotting too easily:        Skin    Rashes or ulcers:        Constitutional    Fever or chills:      PHYSICAL EXAM:       Vitals:   09/26/16 1601 09/26/16 1603  BP: 122/84 124/83  Pulse: 100   Resp: 18   SpO2: 100%   Weight: 161 lb 14.4 oz (73.4 kg)   Height: 4\' 11"  (1.499 m)     GENERAL: The patient is a well-nourished female, in no acute distress. The vital signs are documented above. CARDIAC: There is a regular rate and rhythm.  VASCULAR: Mobile mass in the right neck PULMONARY: Non-labored respirations MUSCULOSKELETAL: There are no major deformities or cyanosis. NEUROLOGIC: No focal weakness or paresthesias are detected. SKIN: There are no ulcers or rashes noted. PSYCHIATRIC: The patient has a normal affect.  STUDIES:   I have reviewed her CT scan with the following findings: 1. Large 3.9 cm hypervascular mass within the right carotid space, splaying the proximal right ICA and ECA, and with numerous small recur did vessels  extending from the right thoracic inlet to the mass. This lesion and the associated vessel recruitment have progressed since 2011 (1.5 cm mass at that time). 2. Small but very similar contralateral left carotid space hypervascular lesion measuring 1.2 cm and situated between the proximal right ICA and ECA. This lesion appears to be without vessel recruitment, and was 1-2 mm in 2011. 3. Chronic right peritracheal soft tissue mass, thought related to Castleman disease, which was much more hypervascular in 2015 and before, and on a 07/10/2013 CT demonstrated similar surrounding feeding vessels as in #1. 4. Lastly, in recent years there have been several new reports of histology proven Castleman disease of the carotid space imitating carotid body tumor ( [email or web address] ). 5. Therefore, there is a a differential diagnosis of the bilateral carotid space lesions in this case: Carotid Body Tumor (paraganglioma) versus unusual manifestation of Castleman Disease. And the latter may be more likely.  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  She understands that there are bilateral lesions.  On CT scan the mass appears to go on the high side, however on physical exam I feel I can palpate the extent of the mass.  I am having the patient see Dr. Redmond Baseman this coming Tuesday for his opinion regarding resectability.  If he is in agreement to proceed, we will get the patient scheduled for removal of her carotid body tumor.  She will need preoperative embolization by neuroradiology.  I discussed the possibility of carotid artery repair, including removal of saphenous vein.  We also discussed the risks of nerve injury of the marginal mandibular, hypoglossal, vagus, and glossopharyngeal nerves.  All of her questions were answered.    Annamarie Major, MD [facility] [phone] Pager [phone]   Patient underwent successful embolization of feeding vessels yesterday.  She has no complaints this am.  The mass feels slightly smaller this am  Plan for resection today.  Patient aware of risks and benefits.  Annamarie Major

## 2016-11-09 NOTE — Progress Notes (Signed)
   Subjective:    Patient ID: Tracy Downs, female    DOB: Nov 23, 1980, 36 y.o.   MRN: [id number]  HPI Doing well.  Not much pain.  Tolerating po intake well.  Review of Systems     Objective:   Physical Exam AF VSS  Drain 80 cc Right neck incision clean and intact, no fluid collection. CNs VII, X, and XII normal.     Assessment & Plan:  Right carotid body tumor s/p resection  Doing great.  Will likely be able to remove drain in morning and discharge home.

## 2016-11-09 NOTE — Brief Op Note (Signed)
11/08/2016 - 11/09/2016  11:27 AM  PATIENT:  [name]  36 y.o. 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  PROCEDURE:  Procedure(s): TUMOR EXCISION CAROTID BODY, right (Right)  SURGEON:  Surgeon(s) and Role:    * Serafina Mitchell, MD - Primary    * Melida Quitter, MD - Assisting  PHYSICIAN ASSISTANT:   ASSISTANTS: none   ANESTHESIA:   general  EBL:  Total I/O In: 900 [I.V.:900] Out: 650 [Urine:600; Blood:50]  BLOOD ADMINISTERED:none  DRAINS: (7 Fr) Jackson-Pratt drain(s) with closed bulb suction in the right neck   LOCAL MEDICATIONS USED:  LIDOCAINE   SPECIMEN:  Source of Specimen:  right neck mass  DISPOSITION OF SPECIMEN:  PATHOLOGY  COUNTS:  YES  TOURNIQUET:  * No tourniquets in log *  DICTATION: .Other Dictation: Dictation Number [id number]  PLAN OF CARE: Admit for overnight observation  PATIENT DISPOSITION:  PACU - hemodynamically stable.   Delay start of Pharmacological VTE agent (>24hrs) due to surgical blood loss or risk of bleeding: yes

## 2016-11-09 NOTE — Anesthesia Preprocedure Evaluation (Addendum)
Anesthesia Evaluation  Patient identified by MRN, date of birth, ID band Patient awake    Reviewed: Allergy & Precautions, NPO status , Patient's Chart, lab work & pertinent test results  Airway Mallampati: II  TM Distance: >3 FB Neck ROM: Full    Dental  (+) Teeth Intact, Dental Advisory Given   Pulmonary    breath sounds clear to auscultation       Cardiovascular hypertension,  Rhythm:Regular Rate:Normal     Neuro/Psych    GI/Hepatic   Endo/Other    Renal/GU      Musculoskeletal   Abdominal   Peds  Hematology   Anesthesia Other Findings   Reproductive/Obstetrics                            Anesthesia Physical Anesthesia Plan  ASA: III  Anesthesia Plan: General   Post-op Pain Management:    Induction: Intravenous  PONV Risk Score and Plan: Ondansetron and Dexamethasone  Airway Management Planned: Oral ETT  Additional Equipment:   Intra-op Plan:   Post-operative Plan:   Informed Consent: I have reviewed the patients History and Physical, chart, labs and discussed the procedure including the risks, benefits and alternatives for the proposed anesthesia with the patient or authorized representative who has indicated his/her understanding and acceptance.   Dental advisory given  Plan Discussed with: CRNA and Anesthesiologist  Anesthesia Plan Comments:         Anesthesia Quick Evaluation

## 2016-11-09 NOTE — Op Note (Signed)
    Patient name: Tracy Downs MRN: [id number] DOB: Sep 10, 1980 Sex: female  11/08/2016 - 11/09/2016 Pre-operative Diagnosis: Right neck mass (likely carotid body tumor) Post-operative diagnosis:  Same Surgeon:  Phoebe Perch Assistants:  Silva Bandy Procedure:   Resection of right carotid body tumor Anesthesia:  Gen. Blood Loss:  See anesthesia record Specimens:  Right neck mass and right cervical lymph node sent for permanent pathology  Findings:  Mass located at the carotid bifurcation.  The vagus nerve and hypoglossal nerve were visualized and protected.  The mass was able to be dissected off the anterior surface of the carotid artery without injury to the artery  Indications:  The patient suffers from Castleman's disease.  She has been followed for a right neck mass which was thought to be a lymph node for a while.  Most recent imaging suggested that this could be a carotid body tumor.  Based on the size and location, we have elected to proceed with repair.  The patient did undergo embolization of feeding vessels yesterday.  Procedure:  The patient was identified in the holding area and taken to [place] 11  The patient was then placed supine on the table. general anesthesia was administered.  The patient was prepped and draped in the usual sterile fashion.  A time out was called and antibiotics were administered.  A transverse incision was made in the skin crease.  Cautery was used to divide subcutaneous tissue down to platysma which was divided.  Subplatysmal flaps were raised.  We then dissected down the anterior border of the sternocleidomastoid.  We initially encountered a large lymph node which was dissected out and removed and sent to pathology.  Deeper dissection exposed the mass.  Using cautery and blunt dissection the mass was mobilized.  It was very vascular.  We identified the hypoglossal nerve as well as the vagus nerve and protected them.  The mass splayed open the  carotid bifurcation.  He was able to be removed in its entirety.  Once it was removed, hemostasis was checked and found to be satisfactory.  Appropriate Doppler signals were obtained in the internal, external and common carotid artery.  Flow was irrigated.  A 7 French drain was placed.  The platysma muscle was reapproximated with 3-0 Vicryl.  The skin was closed with 4-0 Vicryl.  The drain was secured in place.  Dermabond was applied.  There were no immediate complications.   Disposition:  To PACU stable   V. Annamarie Major, M.D. [facility] [place] Office: [phone] Pager:  [phone]

## 2016-11-09 NOTE — Op Note (Signed)
NAME:  Tracy Downs, Tracy Downs                    ACCOUNT NO.:  MEDICAL RECORD NO.:  [id number]  LOCATION:                                 FACILITY:  PHYSICIAN:  Onnie Graham, MD     DATE OF BIRTH:  1980/08/23  DATE OF PROCEDURE:  11/09/2016 DATE OF DISCHARGE:                              OPERATIVE REPORT   PREOPERATIVE DIAGNOSIS:  Right carotid body tumor.  POSTOPERATIVE DIAGNOSIS:  Right carotid body tumor.  PROCEDURE:  Excision of right carotid body tumor.  SURGEON:  Dr. Melida Quitter.  CO-SURGEONS:  Dr. Harold Barban.  ANESTHESIA:  General endotracheal anesthesia.  COMPLICATIONS:  None.  INDICATION:  The patient is a 36 year old female, who has had a growing mass in the right neck for several months.  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  She does have a smaller one on the left side as well.  She presents to the operating room for surgical management of the right-sided tumor.  She was admitted yesterday and underwent embolization of the right carotid body tumor by Interventional Radiology.  FINDINGS:  There was a round, firm mass found within the carotid bifurcation that was vascular in nature and was able to be dissected from the external and internal carotid arteries as well as the carotid bifurcation.  The 12th nerve and 10th nerve were identified during the case and kept intact.  A couple of small lymph nodes adjacent to the tumor were also removed and sent for pathology.  DESCRIPTION OF PROCEDURE:  The patient was identified in the holding room.  Informed consent having been obtained including discussion of risks, benefits, and alternatives, the patient was brought to the operative suite and put on the operating table in supine position. Anesthesia was induced.  The patient was intubated by the Anesthesia team without difficulty.  The patient was given intravenous antibiotics during the case.  The right neck incision  was marked with a marking pen and injected with 1% lidocaine with 1:100,000 epinephrine.  The right neck and groin were prepped and draped in a sterile fashion.  Dr. Trula Slade was co-surgeon during the case and 2 surgeons were necessary because of the need for expertise in the vascular portion as well as in exposure.  After time-out was made or was taken, the right neck incision was made with a #15 blade scalpel and extended through subcutaneous and platysma layer using Bovie electrocautery.  Subplatysmal flaps were elevated superiorly and inferiorly and a self-retaining retractor was added.  Dissection was then performed down to the sternocleidomastoid muscle, which was then skeletonized down toward the great vessels.  Soft tissues were elevated in an anterior direction.  A couple of lymph nodes were identified on the superficial extent of the tumor and removed with Bovie electrocautery and sent for pathology.  Careful dissection was then performed down to the tumor and soft tissues were divided around the tumor requiring hemoclips and suture ligature of several vessels. This allowed better exposure of the tumor.  It was then dissected free from the external carotid artery 1st with careful dissection.  It was then dissected from the  carotid bifurcation and then from the internal carotid artery more posteriorly.  Careful dissection was then performed more superiorly around the extent of the tumor and this up to where the 12th nerve was identified and kept intact.  The fascial connections were then divided around the superior extent of the tumor and was then lifted from the deep tissues and divided and removed.  The ansa cervicalis was sacrificed with the tumor.  At this point, the bleeding was controlled with bipolar electrocautery.  The wound was copiously irrigated with saline.  The 10th nerve was identified on the posterior extent of the internal carotid artery.  Dr. Trula Slade, who did a  lot of the work along the carotid arteries and then applied a Doppler to the carotid arteries and found good flow in both the arteries.  At this point, a 7-French round drain was placed in the depth of the wound and secured to the skin using 2-0 nylon suture in a standard drain stitch.  The platysma muscle was closed with 3-0 Vicryl suture in a simple running fashion.  The subcutaneous tissue layer was closed with 4-0 Vicryl suture in a simple interrupted fashion.  The skin was closed with Dermabond.  Drapes were removed.  The patient was cleaned off.  The drain was hooked to bulb suction and taped to the right shoulder.  She was then returned to Anesthesia for wake up, was extubated in the recovery room in stable condition.     Onnie Graham, MD     DDB/MEDQ  D:  11/09/2016  T:  11/09/2016  Job:  [id number]  cc:   __________ __________

## 2016-11-09 NOTE — Anesthesia Postprocedure Evaluation (Signed)
Anesthesia Post Note  Patient: [name]  Procedure(s) Performed: Procedure(s) (LRB): Carotid artery body tumor embolization (Right)     Patient location during evaluation: PACU Anesthesia Type: General Level of consciousness: awake and alert Pain management: pain level controlled Vital Signs Assessment: post-procedure vital signs reviewed and stable Respiratory status: spontaneous breathing, nonlabored ventilation, respiratory function stable and patient connected to nasal cannula oxygen Cardiovascular status: blood pressure returned to baseline and stable Postop Assessment: no signs of nausea or vomiting Anesthetic complications: no    Last Vitals:  Vitals:   11/09/16 1238 11/09/16 1300  BP: 122/89   Pulse: 73   Resp: 10   Temp:  36.8 C    Last Pain:  Vitals:   11/09/16 1300  TempSrc:   PainSc: 3                  Jakeel Starliper

## 2016-11-09 NOTE — Progress Notes (Signed)
Noted swelling on arrival to PACU. Bates at bedside and notified. MD stated to milk JP drain q36mins and apply slight pressure to incision to help with drainage collection. Monitored for 1 hour. Swelling slightly decreased, JP drain continuing to slowly drain. Reported to RN on transfer back to 44M to notify Redmond Baseman or Brabham if swelling worsens.

## 2016-11-09 NOTE — Progress Notes (Signed)
     Supervising Physician: Luanne Bras  Patient Status: [facility] - In-pt  Subjective: Pt seen in OR holding, ready for procedure today S/p embolization of right carotid body tumor in preparation for surgical resection. She feels pretty well this am. Some mild soreness in right groin. Family at bedside  Objective: Physical Exam: BP 98/68   Pulse 90   Temp 97.9 F (36.6 C) (Oral)   Resp 14   Ht 4\' 11"  (1.499 m)   Wt 165 lb (74.8 kg)   LMP 11/01/2016 (Exact Date)   SpO2 98%   BMI 33.33 kg/m  A&O x 3.Speech and comp normal. No facial asymmetry. Tongue midline.. No pronation drift. RT groin soft. No ecchymosis or hematoma. Pulses DP 2+,and PTs 1+.   Current Facility-Administered Medications:  .  0.9 %  sodium chloride infusion, , Intravenous, Continuous, Deveshwar, Sanjeev, MD, Last Rate: 80 mL/hr at 11/09/16 0600 .  0.9 % irrigation (POUR BTL), , , PRN, Serafina Mitchell, MD, 2,000 mL at 11/09/16 0847 .  clevidipine (CLEVIPREX) infusion 0.5 mg/mL, 0-21 mg/hr, Intravenous, Continuous, Deveshwar, Sanjeev, MD, Last Rate: 4 mL/hr at 11/09/16 0600, 2 mg/hr at 11/09/16 0600 .  heparin 6,000 Units in sodium chloride 0.9 % 500 mL irrigation, , , PRN, Serafina Mitchell, MD, 500 mL at 11/09/16 0848 .  lactated ringers infusion, , Intravenous, Continuous, Janeece Riggers, MD, Last Rate: 10 mL/hr at 11/09/16 0600 .  lactated ringers infusion, , Intravenous, Continuous, Roberts Gaudy, MD, Last Rate: 10 mL/hr at 11/09/16 0732 .  [MAR Hold] ondansetron (ZOFRAN) injection 4 mg, 4 mg, Intravenous, Q6H PRN, Deveshwar, Willaim Rayas, MD .  Doug Sou Hold] vancomycin (VANCOCIN) IVPB 1000 mg/200 mL premix, 1,000 mg, Intravenous, To OR, Serafina Mitchell, MD  Labs: CBC  Recent Labs  11/09/16 0444  WBC 12.2*  HGB 12.0  HCT 38.7  PLT 300   BMET  Recent Labs  11/09/16 0444  NA 138  K 3.9  CL 108  CO2 21*  GLUCOSE 112*  BUN 6  CREATININE 0.68  CALCIUM 8.6*   LFT No results for input(s):  PROT, ALBUMIN, AST, ALT, ALKPHOS, BILITOT, BILIDIR, IBILI, LIPASE in the last 72 hours. PT/INR No results for input(s): LABPROT, INR in the last 72 hours.   Studies/Results: No results found.  Assessment/Plan: S/p embolization of right carotid body tumor 7/27 To OR today for resection Will update Dr. Estanislado Pandy    LOS: 1 day   I spent a total of 15 minutes in face to face in clinical consultation, greater than 50% of which was counseling/coordinating care for carotid body tumor  Ascencion Dike PA-C 11/09/2016 8:48 AM

## 2016-11-09 NOTE — Transfer of Care (Signed)
Immediate Anesthesia Transfer of Care Note  Patient: [name]  Procedure(s) Performed: Procedure(s): TUMOR EXCISION CAROTID BODY, right (Right)  Patient Location: PACU  Anesthesia Type:General  Level of Consciousness: awake, alert  and patient cooperative  Airway & Oxygen Therapy: Patient Spontanous Breathing and Patient connected to nasal cannula oxygen  Post-op Assessment: Report given to RN, Post -op Vital signs reviewed and stable and Patient moving all extremities X 4  Post vital signs: Reviewed and stable  Last Vitals:  Vitals:   11/09/16 0600 11/09/16 1141  BP:    Pulse: 90   Resp: 14   Temp:  (P) 36.8 C    Last Pain:  Vitals:   11/09/16 1141  TempSrc:   PainSc: (P) 8       Patients Stated Pain Goal: 3 (89/37/34 2876)  Complications: No apparent anesthesia complications

## 2016-11-10 LAB — BASIC METABOLIC PANEL
Anion gap: 6 (ref 5–15)
BUN: 9 mg/dL (ref 6–20)
CALCIUM: 8 mg/dL — AB (ref 8.9–10.3)
CHLORIDE: 107 mmol/L (ref 101–111)
CO2: 24 mmol/L (ref 22–32)
CREATININE: 0.84 mg/dL (ref 0.44–1.00)
Glucose, Bld: 94 mg/dL (ref 65–99)
Potassium: 4 mmol/L (ref 3.5–5.1)
SODIUM: 137 mmol/L (ref 135–145)

## 2016-11-10 LAB — CBC
HCT: 37.1 % (ref 36.0–46.0)
Hemoglobin: 11.3 g/dL — ABNORMAL LOW (ref 12.0–15.0)
MCH: 27.2 pg (ref 26.0–34.0)
MCHC: 30.5 g/dL (ref 30.0–36.0)
MCV: 89.2 fL (ref 78.0–100.0)
PLATELETS: 281 10*3/uL (ref 150–400)
RBC: 4.16 MIL/uL (ref 3.87–5.11)
RDW: 14.8 % (ref 11.5–15.5)
WBC: 10.2 10*3/uL (ref 4.0–10.5)

## 2016-11-10 LAB — TRIGLYCERIDES: TRIGLYCERIDES: 56 mg/dL (ref <150)

## 2016-11-10 MED ORDER — OXYCODONE-ACETAMINOPHEN 5-325 MG PO TABS: 1.0000 | ORAL_TABLET | Freq: Four times a day (QID) | ORAL | 0 refills | Status: DC | PRN

## 2016-11-10 NOTE — Discharge Instructions (Signed)
° °  [facility]  Discharge Instructions  Please refer to the following instructions for your post-procedure care. Your surgeon or physician assistant will discuss any changes with you.  Activity  You are encouraged to walk as much as you can. You can slowly return to normal activities but must avoid strenuous activity and heavy lifting until your doctor tell you it's OK. Avoid activities such as vacuuming or swinging a golf club. You can drive after one week if you are comfortable and you are no longer taking prescription pain medications. It is normal to feel tired for serval weeks after your surgery. It is also normal to have difficulty with sleep habits, eating, and bowel movements after surgery. These will go away with time.  Bathing/Showering  You may shower after you go home. Do not soak in a bathtub, hot tub, or swim until the incision heals completely.  Incision Care  Shower every day. Clean your incision with mild soap and water. Pat the area dry with a clean towel. You do not need a bandage unless otherwise instructed. Do not apply any ointments or creams to your incision. You may have skin glue on your incision. Do not peel it off. It will come off on its own in about one week. Your incision may feel thickened and raised for several weeks after your surgery. This is normal and the skin will soften over time. For Men Only: It's OK to shave around the incision but do not shave the incision itself for 2 weeks. It is common to have numbness under your chin that could last for several months.  Diet  Resume your normal diet. There are no special food restrictions following this procedure. A low fat/low cholesterol diet is recommended for all patients with vascular disease. In order to heal from your surgery, it is CRITICAL to get adequate nutrition. Your body requires vitamins, minerals, and protein. Vegetables are the best source of vitamins and minerals.  Vegetables also provide the perfect balance of protein. Processed food has little nutritional value, so try to avoid this.  Medications  Resume taking all of your medications unless your doctor or physician assistant tells you not to. If your incision is causing pain, you may take over-the- counter pain relievers such as acetaminophen (Tylenol). If you were prescribed a stronger pain medication, please be aware these medications can cause nausea and constipation. Prevent nausea by taking the medication with a snack or meal. Avoid constipation by drinking plenty of fluids and eating foods with a high amount of fiber, such as fruits, vegetables, and grains. Do not take Tylenol if you are taking prescription pain medications.  Follow Up  Our office will schedule a follow up appointment 2-3 weeks following discharge.  Please call us immediately for any of the following conditions  Increased pain, redness, drainage (pus) from your incision site. Fever of 101 degrees or higher. If you should develop stroke (slurred speech, difficulty swallowing, weakness on one side of your body, loss of vision) you should call 911 and go to the nearest emergency room.  Reduce your risk of vascular disease:  Stop smoking. If you would like help call [facility] at 1-800-QUIT-NOW (718)084-1042) or [place] at [phone]. Manage your cholesterol Maintain a desired weight Control your diabetes Keep your blood pressure down  If you have any questions, please call the office at [phone].

## 2016-11-10 NOTE — Anesthesia Postprocedure Evaluation (Signed)
Anesthesia Post Note  Patient: [employer]  Procedure(s) Performed: Procedure(s) (LRB): TUMOR EXCISION CAROTID BODY, right (Right)     Patient location during evaluation: PACU Anesthesia Type: General Level of consciousness: awake, awake and alert and oriented Pain management: pain level controlled Vital Signs Assessment: post-procedure vital signs reviewed and stable Respiratory status: spontaneous breathing, nonlabored ventilation and respiratory function stable Cardiovascular status: blood pressure returned to baseline Anesthetic complications: no    Last Vitals:  Vitals:   11/10/16 0700 11/10/16 0812  BP: 116/87   Pulse: 93   Resp: 18   Temp:  36.7 C    Last Pain:  Vitals:   11/10/16 0812  TempSrc: Oral  PainSc:                  Shireen Rayburn COKER

## 2016-11-10 NOTE — Progress Notes (Signed)
   Subjective:    Patient ID: Tracy Downs, female    DOB: [birth year], 36 y.o.   MRN: [id number]  HPI Did well overnight without a lot of pain.  Swallowing well.    Review of Systems     Objective:   Physical Exam AF VSS.  Drain with total of 150 cc since surgery. Right neck incision clean and intact, no fluid collection. CN VII, X, and XII intact.    Assessment & Plan:  Right carotid body tumor s/p resection  Doing great.  I removed the drain.  She is stable for discharge home.

## 2016-11-10 NOTE — Anesthesia Postprocedure Evaluation (Signed)
Anesthesia Post Note  Patient: Tracy Downs  Procedure(s) Performed: Procedure(s) (LRB): TUMOR EXCISION CAROTID BODY, right (Right)     Anesthesia Post Evaluation  Last Vitals:  Vitals:   11/10/16 0700 11/10/16 0812  BP: 116/87   Pulse: 93   Resp: 18   Temp:  36.7 C    Last Pain:  Vitals:   11/10/16 0812  TempSrc: Oral  PainSc:                  [name]

## 2016-11-10 NOTE — Discharge Summary (Signed)
Discharge Summary    Tracy Downs [birth year] 36 y.o. female  [id number]  Admission Date: 11/08/2016  Discharge Date: 11/10/16  Physician: Serafina Mitchell, MD  Admission Diagnosis: Castleman disease D47.Z2 Carotid Body Tumor  D44.6   HPI:   This is a 36 y.o. female  who was referred to Dr. Trula Slade by Dr. 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  On her staging CT scans she was found to have a lesion in her right carotid artery.  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  After further evaluation, Dr. Trula Slade has planned for resection of this tumor, but has asked Dr. Estanislado Pandy to evaluate her for a preoperative embolization to decrease the risk of intra-operative bleeding.  The patient denies any complaints.  She presents today for this procedure.    Hospital Course:  The patient was admitted to the hospital and taken to IR on 11/08/2016 and underwent: Cerebral angiogram  Intra-procedural findings   Findings. 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 Superselective embolization performed of 4 main feeders from ascending pharyngeal  artery and one from the facial artery with obliteration of 80 % of tumor  blush using 250 to 355 and 355 to 500 PVA particles.  The pt underwent successful embolization of feeding branches to her right carotid body tumor.  Plan is for excision in OR the next day.  On 11/09/16, pt was taken to the operating room and underwent:  Resection of right carotid body tumor  Findings:  Mass located at the carotid bifurcation.  The vagus nerve and hypoglossal nerve were visualized and protected.  The mass was able to be dissected off the anterior surface of the carotid artery without injury to the artery  The pt tolerated the procedure well and was transported to the PACU in good condition.   By POD 1, she was doing well.  Her drain was  removed and she is discharged home.  The remainder of the hospital course consisted of increasing mobilization and increasing intake of solids without difficulty.  CBC    Component Value Date/Time   WBC 10.2 11/10/2016 0538   RBC 4.16 11/10/2016 0538   HGB 11.3 (L) 11/10/2016 0538   HGB 12.8 06/05/2016 0920   HCT 37.1 11/10/2016 0538   HCT 40.9 06/05/2016 0920   PLT 281 11/10/2016 0538   PLT 332 06/05/2016 0920   MCV 89.2 11/10/2016 0538   MCV 89.5 06/05/2016 0920   MCH 27.2 11/10/2016 0538   MCHC 30.5 11/10/2016 0538   RDW 14.8 11/10/2016 0538   RDW 14.7 (H) 06/05/2016 0920   LYMPHSABS 1.4 11/09/2016 0444   LYMPHSABS 2.0 06/05/2016 0920   MONOABS 0.6 11/09/2016 0444   MONOABS 0.4 06/05/2016 0920   EOSABS 0.0 11/09/2016 0444   EOSABS 0.2 06/05/2016 0920   BASOSABS 0.0 11/09/2016 0444   BASOSABS 0.1 06/05/2016 0920    BMET    Component Value Date/Time   NA 137 11/10/2016 0538   NA 139 06/05/2016 0920   K 4.0 11/10/2016 0538   K 4.5 06/05/2016 0920   CL 107 11/10/2016 0538   CO2 24 11/10/2016 0538   CO2 25 06/05/2016 0920   GLUCOSE 94 11/10/2016 0538   GLUCOSE 96 06/05/2016 0920   BUN 9 11/10/2016 0538   BUN 11.5 06/05/2016 0920   CREATININE 0.84 11/10/2016 0538   CREATININE 0.8 06/05/2016 0920   CALCIUM  8.0 (L) 11/10/2016 0538   CALCIUM 9.7 06/05/2016 0920   GFRNONAA >60 11/10/2016 0538   GFRAA >60 11/10/2016 0538        Discharge Diagnosis:  Castleman disease D47.Z2 Carotid Body Tumor  D44.6  Secondary Diagnosis: Patient Active Problem List   Diagnosis Date Noted  . 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 Leiomyoma of uterus, unspecified 11/18/2012  . Castleman disease ([place]) 11/05/2012  . Nonspecific (abnormal) findings on radiological and other examination of body structure 06/16/2009  . COMPUTERIZED TOMOGRAPHY, CHEST, ABNORMAL 06/16/2009   Past Medical History:  Diagnosis Date  . Castleman disease ([place])   . Deafness in left ear   .  Pre-eclampsia 11/05/2016   during pregnancy only     Allergies as of 11/10/2016      Reactions   Hydrocodone Other (See Comments)   Urinary retention   Amoxicillin Rash   Childhood allergy    Penicillins Rash   Childhood allergy  Has patient had a PCN reaction causing immediate rash, facial/tongue/throat swelling, SOB or lightheadedness with hypotension: Yes Has patient had a PCN reaction causing severe rash involving mucus membranes or skin necrosis: Unknown Has patient had a PCN reaction that required hospitalization: No Has patient had a PCN reaction occurring within the last 10 years: No If all of the above answers are "NO", then may proceed with Cephalosporin use.      Medication List    TAKE these medications   CLEAR EYES OP Place 2 drops into both eyes daily as needed (red/dry eyes).   loratadine 10 MG tablet Commonly known as:  CLARITIN Take 10 mg by mouth daily as needed for allergies.   MULTIPLE VITAMINS/WOMENS PO Take 1 tablet by mouth daily.   oxyCODONE-acetaminophen 5-325 MG tablet Commonly known as:  PERCOCET/ROXICET Take 1 tablet by mouth every 6 (six) hours as needed for moderate pain.       Prescriptions given: Roxicet #8 No Refill  Instructions:   [facility]  Discharge Instructions  Please refer to the following instructions for your post-procedure care. Your surgeon or physician assistant will discuss any changes with you.  Activity  You are encouraged to walk as much as you can. You can slowly return to normal activities but must avoid strenuous activity and heavy lifting until your doctor tell you it's OK. Avoid activities such as vacuuming or swinging a golf club. You can drive after one week if you are comfortable and you are no longer taking prescription pain medications. It is normal to feel tired for serval weeks after your surgery. It is also normal to have difficulty with sleep habits, eating, and bowel  movements after surgery. These will go away with time.  Bathing/Showering  You may shower after you go home. Do not soak in a bathtub, hot tub, or swim until the incision heals completely.  Incision Care  Shower every day. Clean your incision with mild soap and water. Pat the area dry with a clean towel. You do not need a bandage unless otherwise instructed. Do not apply any ointments or creams to your incision. You may have skin glue on your incision. Do not peel it off. It will come off on its own in about one week. Your incision may feel thickened and raised for several weeks after your surgery. This is normal and the skin will soften over time. For Men Only: It's OK to shave around the incision but do not shave the incision itself for 2  weeks. It is common to have numbness under your chin that could last for several months.  Diet  Resume your normal diet. There are no special food restrictions following this procedure. A low fat/low cholesterol diet is recommended for all patients with vascular disease. In order to heal from your surgery, it is CRITICAL to get adequate nutrition. Your body requires vitamins, minerals, and protein. Vegetables are the best source of vitamins and minerals. Vegetables also provide the perfect balance of protein. Processed food has little nutritional value, so try to avoid this.  Medications  Resume taking all of your medications unless your doctor or physician assistant tells you not to. If your incision is causing pain, you may take over-the- counter pain relievers such as acetaminophen (Tylenol). If you were prescribed a stronger pain medication, please be aware these medications can cause nausea and constipation. Prevent nausea by taking the medication with a snack or meal. Avoid constipation by drinking plenty of fluids and eating foods with a high amount of fiber, such as fruits, vegetables, and grains. Do not take Tylenol if you are taking prescription pain  medications.  Follow Up  Our office will schedule a follow up appointment 2-3 weeks following discharge.  Please call us immediately for any of the following conditions  Increased pain, redness, drainage (pus) from your incision site. Fever of 101 degrees or higher. If you should develop stroke (slurred speech, difficulty swallowing, weakness on one side of your body, loss of vision) you should call 911 and go to the nearest emergency room.  Reduce your risk of vascular disease:  Stop smoking. If you would like help call [facility] at 1-800-QUIT-NOW [phone]) or [place] at [phone]. Manage your cholesterol Maintain a desired weight Control your diabetes Keep your blood pressure down  If you have any questions, please call the office at [phone].   Disposition: home  Patient's condition: is Good  Follow up: 1. Dr. Trula Slade in 2-3 weeks   Leontine Locket, PA-C [facility] [phone] 11/10/2016  9:30 AM

## 2016-11-10 NOTE — Progress Notes (Signed)
Subjective: Interval History: none.. Looks great this morning. Neurologically intact. Swallowing without difficulty. Mild discomfort.  Objective: Vital signs in last 24 hours: Temp:  [97.7 F (36.5 C)-100 F (37.8 C)] 98.2 F (36.8 C) (07/28 0331) Pulse Rate:  [73-106] 79 (07/28 0600) Resp:  [10-20] 13 (07/28 0600) BP: (87-134)/(62-101) 117/84 (07/28 0600) SpO2:  [94 %-100 %] 100 % (07/28 0600) Arterial Line BP: (103-170)/(56-162) 138/76 (07/28 0500)  Intake/Output from previous day: 07/27 0701 - 07/28 0700 In: 2594.7 [I.V.:2394.7; IV Piggyback:200] Out: 2430 [Urine:2250; Drains:130; Blood:50] Intake/Output this shift: No intake/output data recorded.  Neurologically intact. Right neck wound without hematoma.  Lab Results:  Recent Labs  11/09/16 0444 11/10/16 0538  WBC 12.2* 10.2  HGB 12.0 11.3*  HCT 38.7 37.1  PLT 300 281   BMET  Recent Labs  11/09/16 0444 11/10/16 0538  NA 138 137  K 3.9 4.0  CL 108 107  CO2 21* 24  GLUCOSE 112* 94  BUN 6 9  CREATININE 0.68 0.84  CALCIUM 8.6* 8.0*    Studies/Results: No results found. Anti-infectives: Anti-infectives    Start     Dose/Rate Route Frequency Ordered Stop   11/09/16 1330  vancomycin (VANCOCIN) IVPB 1000 mg/200 mL premix     1,000 mg 200 mL/hr over 60 Minutes Intravenous Every 12 hours 11/09/16 1319 11/10/16 0220   11/09/16 0800  vancomycin (VANCOCIN) IVPB 1000 mg/200 mL premix     1,000 mg 200 mL/hr over 60 Minutes Intravenous To Surgery 11/08/16 1619 11/09/16 1028   11/08/16 0900  vancomycin (VANCOCIN) IVPB 1000 mg/200 mL premix     1,000 mg 200 mL/hr over 60 Minutes Intravenous To ShortStay Surgical 11/07/16 0849 11/08/16 1303   11/08/16 0615  vancomycin (VANCOCIN) 1,000 mg in sodium chloride 0.9 % 250 mL IVPB  Status:  Discontinued     1,000 mg 250 mL/hr over 60 Minutes Intravenous To Radiology 11/08/16 0604 11/08/16 0605      Assessment/Plan: s/p Procedure(s): TUMOR EXCISION CAROTID BODY,  right (Right) Stable overall. 80 cc out right neck drain total. Plan regarding drain per Dr. Redmond Baseman. Should be okay for discharge today.   LOS: 2 days   Tracy Downs 11/10/2016, 7:25 AM

## 2016-11-11 ENCOUNTER — Encounter (HOSPITAL_COMMUNITY): Payer: Self-pay | Admitting: Surgery

## 2016-11-12 ENCOUNTER — Telehealth: Payer: Self-pay | Admitting: Surgery

## 2016-11-12 NOTE — Telephone Encounter (Signed)
Sched appt 12/10/16 at 10:15. Spoke to pt.

## 2016-11-12 NOTE — Telephone Encounter (Signed)
-----   Message from Mena Goes, RN sent at 11/10/2016 12:45 PM EDT ----- Regarding: 2-3 weeks   ----- Message ----- From: Gabriel Earing, PA-C Sent: 11/10/2016   9:26 AM To: Vvs Charge Pool  S/p removal of carotid body tumor.  F/u with Dr. Trula Slade in 2-3 weeks.  Thanks

## 2016-11-13 ENCOUNTER — Encounter (HOSPITAL_COMMUNITY): Payer: Self-pay | Admitting: Interventional Radiology

## 2016-11-26 ENCOUNTER — Telehealth: Payer: Self-pay | Admitting: Nurse Practitioner

## 2016-11-26 ENCOUNTER — Ambulatory Visit (HOSPITAL_BASED_OUTPATIENT_CLINIC_OR_DEPARTMENT_OTHER): Payer: BLUE CROSS/BLUE SHIELD | Admitting: Nurse Practitioner

## 2016-11-26 VITALS — BP 120/85 | HR 93 | Temp 98.4°F | Resp 18 | Ht 59.0 in | Wt 165.9 lb

## 2016-11-26 DIAGNOSIS — D47Z2 Castleman disease: Secondary | ICD-10-CM

## 2016-11-26 NOTE — Telephone Encounter (Signed)
Scheduled appt per 8/13 los - Gave patient AVS and calender per los. - Central radiology to contact patient with ct .

## 2016-11-26 NOTE — Progress Notes (Addendum)
[place] OFFICE PROGRESS NOTE   Diagnosis:  Castleman's disease  INTERVAL HISTORY:   Tracy Downs returns as scheduled. She is recovering from the recent surgery. She notes limited range of motion at the right neck. No neck pain. She denies shortness of breath. No chest pain.  Objective:  Vital signs in last 24 hours:  Blood pressure 120/85, pulse 93, temperature 98.4 F (36.9 C), temperature source Oral, resp. rate 18, height 4\' 11"  (1.499 m), weight 165 lb 14.4 oz (75.3 kg), last menstrual period 11/01/2016, SpO2 100 %.    HEENT: Healing surgical incision right neck. No surrounding erythema. Lymphatics: No palpable cervical, supraclavicular, axillary lymph nodes. Resp: Lungs clear bilaterally. Cardio: Regular rate and rhythm. GI: Abdomen soft and nontender. No post hepatomegaly. Vascular: No leg edema.   Lab Results:  Lab Results  Component Value Date   WBC 10.2 11/10/2016   HGB 11.3 (L) 11/10/2016   HCT 37.1 11/10/2016   MCV 89.2 11/10/2016   PLT 281 11/10/2016   NEUTROABS 10.2 (H) 11/09/2016    Imaging:  No results found.  Medications: I have reviewed the patient's current medications.  Assessment/Plan: 1. Castleman's disease. A CT of the chest 06/09/2009 confirmed a right paratracheal mass. A mediastinoscopy 06/21/2009 with biopsy of the mediastinal mass confirmed Castleman's disease. Staging CTs of the chest, abdomen and pelvis 08/12/2009 confirmed a right paratracheal mass, a superior mediastinal lymph node and a left cervical/supraclavicular lymph node. There was no evidence of Castleman's disease in the abdomen or pelvis. A CT on 11/07/2009 was stable.  Restaging CTs 07/10/2013 confirmed worsening of a single level 2 right cervical lymph node compared to a CT from 11/07/2009. No significant change in paratracheal lymphadenopathy.  Chest x-ray 02/24/2014-stable right paratracheal soft tissue mass  Chest x-ray 08/23/2014-stable right  paratracheal soft tissue mass  CT 11/29/2014 with a slight increase in the right cervical lymph node compared to 07/10/2013 and a stable right paratracheal node  CT chest 07/11/2016 with interval stability of hypervascular right paratracheal lymphadenopathy. No new or progressive disease in the chest.  CT neck 07/11/2016-enlarging heterogeneous hyperdense mass lesion within the right carotid space. Approximate 10 mm bilateral level II hyperdense lymph nodes stable. 2. G2 P2, status post delivery of her second child in January 2015 3. Bilateral salpingectomy 06/05/2013 4. Right anterior cervical lymph node on exam 07/03/2013--stable. 5. CT neck 07/11/2016-enlarging heterogeneous hyperdense mass lesion within the right carotid space. Approximate 10 mm bilateral level II hyperdense lymph nodes stable. CT angio neck 08/31/2016-large 3.9 cm hypervascular mass within the right carotid space; small but very similar contralateral left carotid space hypervascular lesion measuring 1.2 cm.   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 Benign reactive cervical lymph node.   Disposition  Tracy Downs appears stable. She remains asymptomatic from the Castleman's disease. Plan for restaging chest CT in approximately 6 months.  She will follow-up with Drs. Redmond Baseman and [place] regarding the paraganglioma/carotid body tumor.  We will see her in follow-up 2-3 days after the chest CT in 6 months to review the results. She will contact the office in the interim with any problems.  Patient seen with Dr. Benay Spice.    [name] ANP/GNP-BC   11/26/2016  4:19 PM This was a shared visit with [name]. Tracy Downs resection of the right neck mass 11/09/2016 with the pathology confirming a paraganglioma. 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  There is no clinical evidence for  progression of the Castleman's disease. She will be  scheduled for a restaging chest CT in 6 months. She will follow-up with Drs. Redmond Baseman and [place] for management of the paraganglioma.  [name], M.D.

## 2016-11-28 ENCOUNTER — Encounter: Payer: Self-pay | Admitting: Surgery

## 2016-12-10 ENCOUNTER — Ambulatory Visit (INDEPENDENT_AMBULATORY_CARE_PROVIDER_SITE_OTHER): Payer: Self-pay | Admitting: Surgery

## 2016-12-10 VITALS — BP 119/84 | HR 82 | Ht 59.0 in | Wt 164.1 lb

## 2016-12-10 DIAGNOSIS — D446 Neoplasm of uncertain behavior of carotid body: Secondary | ICD-10-CM

## 2016-12-10 NOTE — Progress Notes (Signed)
   Patient name: Tracy Downs MRN: [id number] DOB: 1980-06-01 Sex: female  REASON FOR VISIT:     post op  HISTORY OF PRESENT ILLNESS:   Tracy Downs is a 36 y.o. female returns for her first postoperative visit.  She is status post resection of a right carotid body tumor on 11/08/2016.  She underwent preoperative embolization by neuroradiology.  She has not had any trouble at home.  She denies any neurologic symptoms.  CURRENT MEDICATIONS:    Current Outpatient Prescriptions  Medication Sig Dispense Refill  . loratadine (CLARITIN) 10 MG tablet Take 10 mg by mouth daily as needed for allergies.    . Multiple Vitamins-Minerals (MULTIPLE VITAMINS/WOMENS PO) Take 1 tablet by mouth daily.     . Naphazoline HCl (CLEAR EYES OP) Place 2 drops into both eyes daily as needed (red/dry eyes).     No current facility-administered medications for this visit.     REVIEW OF SYSTEMS:   [X]  denotes positive finding, [ ]  denotes negative finding Cardiac  Comments:  Chest pain or chest pressure:    Shortness of breath upon exertion:    Short of breath when lying flat:    Irregular heart rhythm:    Constitutional    Fever or chills:      PHYSICAL EXAM:   Vitals:   12/10/16 1048  BP: 119/84  Pulse: 82  SpO2: 100%  Weight: 164 lb 1.6 oz (74.4 kg)  Height: 4\' 11"  (1.499 m)    GENERAL: The patient is a well-nourished female, in no acute distress. The vital signs are documented above. CARDIOVASCULAR: There is a regular rate and rhythm. PULMONARY: Non-labored respirations Incision is healing nicely  STUDIES:   Pathology confirms paraganglioma   MEDICAL ISSUES:   Patient is recovering nicely from her operation.  We did discuss the need to resect the contralateral side.  This does not need to be done emergently.  I'm scheduling the patient for follow-up in 3 months for further discussions.  Annamarie Major, MD [facility] [phone] Pager [phone]

## 2017-03-12 ENCOUNTER — Other Ambulatory Visit: Payer: Self-pay | Admitting: Obstetrics and Gynecology

## 2017-03-12 DIAGNOSIS — Z1231 Encounter for screening mammogram for malignant neoplasm of breast: Secondary | ICD-10-CM

## 2017-03-25 ENCOUNTER — Ambulatory Visit: Payer: BLUE CROSS/BLUE SHIELD | Admitting: Surgery

## 2017-04-10 ENCOUNTER — Ambulatory Visit
Admission: RE | Admit: 2017-04-10 | Discharge: 2017-04-10 | Disposition: A | Discharge status: Home / Self Care | Payer: BLUE CROSS/BLUE SHIELD | Source: Ambulatory Visit | Attending: Obstetrics and Gynecology | Admitting: Obstetrics and Gynecology

## 2017-04-10 DIAGNOSIS — Z1231 Encounter for screening mammogram for malignant neoplasm of breast: Secondary | ICD-10-CM

## 2017-05-29 ENCOUNTER — Inpatient Hospital Stay: Payer: BLUE CROSS/BLUE SHIELD | Attending: Oncology

## 2017-05-29 ENCOUNTER — Ambulatory Visit (HOSPITAL_COMMUNITY)
Admission: RE | Admit: 2017-05-29 | Discharge: 2017-05-29 | Disposition: A | Discharge status: Home / Self Care | Payer: BLUE CROSS/BLUE SHIELD | Source: Ambulatory Visit | Attending: Nurse Practitioner | Admitting: Nurse Practitioner

## 2017-05-29 ENCOUNTER — Encounter (HOSPITAL_COMMUNITY): Payer: Self-pay

## 2017-05-29 DIAGNOSIS — D47Z2 Castleman disease: Secondary | ICD-10-CM | POA: Diagnosis not present

## 2017-05-29 LAB — COMPREHENSIVE METABOLIC PANEL
ALT: 25 U/L (ref 0–55)
ANION GAP: 9 (ref 3–11)
AST: 24 U/L (ref 5–34)
Albumin: 4.2 g/dL (ref 3.5–5.0)
Alkaline Phosphatase: 59 U/L (ref 40–150)
BUN: 10 mg/dL (ref 7–26)
CHLORIDE: 104 mmol/L (ref 98–109)
CO2: 26 mmol/L (ref 22–29)
Calcium: 9.5 mg/dL (ref 8.4–10.4)
Creatinine, Ser: 0.91 mg/dL (ref 0.60–1.10)
GFR calc Af Amer: >60 mL/min (ref >60)
Glucose, Bld: 86 mg/dL (ref 70–140)
POTASSIUM: 4.3 mmol/L (ref 3.5–5.1)
SODIUM: 139 mmol/L (ref 136–145)
Total Bilirubin: 0.5 mg/dL (ref 0.2–1.2)
Total Protein: 7.4 g/dL (ref 6.4–8.3)

## 2017-05-29 LAB — CBC WITH DIFFERENTIAL/PLATELET
Basophils Absolute: 0 10*3/uL (ref 0.0–0.1)
Basophils Relative: 0 %
EOS ABS: 0.2 10*3/uL (ref 0.0–0.5)
Eosinophils Relative: 3 %
HEMATOCRIT: 44.3 % (ref 34.8–46.6)
HEMOGLOBIN: 14.3 g/dL (ref 11.6–15.9)
LYMPHS ABS: 1.8 10*3/uL (ref 0.9–3.3)
Lymphocytes Relative: 26 %
MCH: 29.4 pg (ref 25.1–34.0)
MCHC: 32.4 g/dL (ref 31.5–36.0)
MCV: 90.7 fL (ref 79.5–101.0)
Monocytes Absolute: 0.5 10*3/uL (ref 0.1–0.9)
Monocytes Relative: 7 %
NEUTROS ABS: 4.3 10*3/uL (ref 1.5–6.5)
NEUTROS PCT: 64 %
Platelets: 283 10*3/uL (ref 145–400)
RBC: 4.88 MIL/uL (ref 3.70–5.45)
RDW: 13.9 % (ref 11.2–14.5)
WBC: 6.7 10*3/uL (ref 3.9–10.3)

## 2017-05-29 MED ORDER — IOPAMIDOL (ISOVUE-300) INJECTION 61%
75.0000 mL | Freq: Once | INTRAVENOUS | Status: AC | PRN
  Administered 2017-05-29: 75 mL via INTRAVENOUS

## 2017-05-29 MED ORDER — IOPAMIDOL (ISOVUE-300) INJECTION 61%
INTRAVENOUS | Status: AC
  Filled 2017-05-29: qty 75

## 2017-05-31 ENCOUNTER — Inpatient Hospital Stay (HOSPITAL_BASED_OUTPATIENT_CLINIC_OR_DEPARTMENT_OTHER): Payer: BLUE CROSS/BLUE SHIELD | Admitting: Oncology

## 2017-05-31 ENCOUNTER — Telehealth: Payer: Self-pay | Admitting: Oncology

## 2017-05-31 VITALS — BP 128/90 | HR 90 | Temp 98.2°F | Resp 19 | Ht 59.0 in | Wt 166.5 lb

## 2017-05-31 DIAGNOSIS — D47Z2 Castleman disease: Secondary | ICD-10-CM

## 2017-05-31 NOTE — Telephone Encounter (Signed)
Scheduled appt per 2/15 los - Gave patient aVS And calender per los.

## 2017-05-31 NOTE — Progress Notes (Signed)
[place] OFFICE PROGRESS NOTE   Diagnosis: Castleman's disease, paraganglioma  INTERVAL HISTORY:   Tracy Downs returns as scheduled.  She underwent resection of the right carotid body tumor on 11/08/2016.  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  There was an additional benign cervical lymph node removed.  She plans to schedule a follow-up appointment with Dr. Trula Slade to discuss management of the apparent left neck paraganglioma.  She denies fever, night sweats, anorexia, dyspnea, and cough.  No complaint.  Objective:  Vital signs in last 24 hours:  Blood pressure 128/90, pulse 90, temperature 98.2 F (36.8 C), temperature source Oral, resp. rate 19, height 4\' 11"  (1.499 m), weight 166 lb 8 oz (75.5 kg), last menstrual period 05/12/2017, SpO2 100 %.    HEENT: Neck without mass Lymphatics: No cervical, supraclavicular, axillary, or inguinal nodes Resp: Lungs clear bilaterally Cardio: Regular rate and rhythm GI: No hepatosplenomegaly  Vascular: No leg edema    Lab Results:  Lab Results  Component Value Date   WBC 6.7 05/29/2017   HGB 14.3 05/29/2017   HCT 44.3 05/29/2017   MCV 90.7 05/29/2017   PLT 283 05/29/2017   NEUTROABS 4.3 05/29/2017    CMP     Component Value Date/Time   NA 139 05/29/2017 0813   NA 139 06/05/2016 0920   K 4.3 05/29/2017 0813   K 4.5 06/05/2016 0920   CL 104 05/29/2017 0813   CO2 26 05/29/2017 0813   CO2 25 06/05/2016 0920   GLUCOSE 86 05/29/2017 0813   GLUCOSE 96 06/05/2016 0920   BUN 10 05/29/2017 0813   BUN 11.5 06/05/2016 0920   CREATININE 0.91 05/29/2017 0813   CREATININE 0.8 06/05/2016 0920   CALCIUM 9.5 05/29/2017 0813   CALCIUM 9.7 06/05/2016 0920   PROT 7.4 05/29/2017 0813   PROT 7.3 07/10/2013 0812   ALBUMIN 4.2 05/29/2017 0813   ALBUMIN 4.2 07/10/2013 0812   AST 24 05/29/2017 0813   AST 23 07/10/2013 0812   ALT 25 05/29/2017 0813   ALT 33 07/10/2013 0812   ALKPHOS 59 05/29/2017 0813   ALKPHOS 87 07/10/2013 0812   BILITOT 0.5 05/29/2017 0813   BILITOT 0.77 07/10/2013 0812   GFRNONAA >60 05/29/2017 0813   GFRAA >60 05/29/2017 0813      Imaging:  Ct Chest W Contrast  Result Date: 05/29/2017 CLINICAL DATA:  Castleman's disease diagnosed in 2011. No acute chest complaints. History of right carotid body tumor excision 11/09/2016. EXAM: CT CHEST WITH CONTRAST TECHNIQUE: Multidetector CT imaging of the chest was performed during intravenous contrast administration. CONTRAST:  25mL ISOVUE-300 IOPAMIDOL (ISOVUE-300) INJECTION 61% COMPARISON:  Chest CT 11/29/2014 and 07/11/2016. FINDINGS: Cardiovascular: There are no significant vascular findings. The heart size is normal. There is no pericardial effusion. Mediastinum/Nodes: Slightly heterogeneous right paratracheal mass is grossly stable, measuring 3.8 x 3.3 cm on image 41 (previously 4.1 x 3.6 cm). No other enlarged mediastinal, hilar or axillary lymph nodes are seen. There is a stable small hiatal hernia. The trachea and thyroid gland appear unremarkable. Lungs/Pleura: There is no pleural effusion or pneumothorax. The lungs are clear. Upper abdomen:  Stable without significant findings. Musculoskeletal/Chest wall: There is no chest wall mass or suspicious osseous finding. IMPRESSION: 1. Stable right paratracheal mass compatible with Castleman's disease. No progressive adenopathy. 2. No acute findings. Electronically Signed   By: Richardean Sale M.D.   On: 05/29/2017 10:47    Medications: I have reviewed the patient's current medications.   Assessment/Plan:  Assessment/Plan: 1. Castleman's disease. A CT of the chest 06/09/2009 confirmed a right paratracheal mass. A mediastinoscopy 06/21/2009 with biopsy of the mediastinal mass confirmed Castleman's disease. Staging CTs of the chest, abdomen and pelvis 08/12/2009 confirmed a right paratracheal mass, a superior mediastinal lymph node and a left cervical/supraclavicular lymph node.  There was no evidence of Castleman's disease in the abdomen or pelvis. A CT on 11/07/2009 was stable.  Restaging CTs 07/10/2013 confirmed worsening of a single level 2 right cervical lymph node compared to a CT from 11/07/2009. No significant change in paratracheal lymphadenopathy.  Chest x-ray 02/24/2014-stable right paratracheal soft tissue mass  Chest x-ray 08/23/2014-stable right paratracheal soft tissue mass  CT 11/29/2014 with a slight increase in the right cervical lymph node compared to 07/10/2013 and a stable right paratracheal node  CT chest 07/11/2016 with interval stability of hypervascular right paratracheal lymphadenopathy. No new or progressive disease in the chest.  CT neck 07/11/2016-enlarging heterogeneous hyperdense mass lesion within the right carotid space. Approximate 10 mm bilateral level II hyperdense lymph nodes stable.  CT chest 05/29/2017-no change in the right paratracheal mass, no evidence of progressive disease 2. G2 P2, status post delivery of her second child in January 2015 3. Bilateral salpingectomy 06/05/2013 4. Right anterior cervical lymph node on exam 07/03/2013--stable. 5. CT neck 07/11/2016-enlarging heterogeneous hyperdense mass lesion within the right carotid space. Approximate 10 mm bilateral level II hyperdense lymph nodes stable. CT angio neck 08/31/2016-large 3.9 cm hypervascular mass within the right carotid space; small but very similar contralateral left carotid space hypervascular lesion measuring 1.2 cm.   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 Benign reactive cervical lymph node.    Disposition: Tracy Downs remains asymptomatic from the Castleman's disease.  There is no clinical or x-ray evidence of disease progression.  The plan is to continue observation.  She will return for an office visit in 6 months.  She will schedule an appointment with Dr. Trula Slade to discuss management of  the left carotid space hypervascular lesion.  15 minutes were spent with the patient today.  The majority of the time was used for counseling and coordination of care.  Betsy Coder, MD  05/31/2017  8:26 AM

## 2017-07-01 ENCOUNTER — Ambulatory Visit (HOSPITAL_COMMUNITY)
Admission: RE | Admit: 2017-07-01 | Discharge: 2017-07-01 | Disposition: A | Discharge status: Home / Self Care | Payer: BLUE CROSS/BLUE SHIELD | Source: Ambulatory Visit | Attending: Nurse Practitioner | Admitting: Nurse Practitioner

## 2017-07-01 ENCOUNTER — Inpatient Hospital Stay (HOSPITAL_BASED_OUTPATIENT_CLINIC_OR_DEPARTMENT_OTHER): Payer: BLUE CROSS/BLUE SHIELD | Admitting: Nurse Practitioner

## 2017-07-01 ENCOUNTER — Telehealth: Payer: Self-pay | Admitting: *Deleted

## 2017-07-01 ENCOUNTER — Other Ambulatory Visit: Payer: Self-pay | Admitting: Nurse Practitioner

## 2017-07-01 ENCOUNTER — Inpatient Hospital Stay: Payer: BLUE CROSS/BLUE SHIELD | Attending: Oncology

## 2017-07-01 ENCOUNTER — Encounter: Payer: Self-pay | Admitting: Nurse Practitioner

## 2017-07-01 VITALS — BP 137/88 | HR 99 | Temp 98.5°F | Resp 18 | Ht 59.0 in | Wt 165.4 lb

## 2017-07-01 DIAGNOSIS — R222 Localized swelling, mass and lump, trunk: Secondary | ICD-10-CM | POA: Insufficient documentation

## 2017-07-01 DIAGNOSIS — R0789 Other chest pain: Secondary | ICD-10-CM | POA: Diagnosis not present

## 2017-07-01 DIAGNOSIS — D47Z2 Castleman disease: Secondary | ICD-10-CM | POA: Insufficient documentation

## 2017-07-01 DIAGNOSIS — D446 Neoplasm of uncertain behavior of carotid body: Secondary | ICD-10-CM | POA: Insufficient documentation

## 2017-07-01 LAB — CMP (CANCER CENTER ONLY)
ALT: 26 U/L (ref 0–55)
AST: 23 U/L (ref 5–34)
Albumin: 4.5 g/dL (ref 3.5–5.0)
Alkaline Phosphatase: 57 U/L (ref 40–150)
Anion gap: 10 (ref 3–11)
BILIRUBIN TOTAL: 0.4 mg/dL (ref 0.2–1.2)
BUN: 12 mg/dL (ref 7–26)
CO2: 25 mmol/L (ref 22–29)
Calcium: 9.9 mg/dL (ref 8.4–10.4)
Chloride: 104 mmol/L (ref 98–109)
Creatinine: 0.94 mg/dL (ref 0.60–1.10)
Glucose, Bld: 89 mg/dL (ref 70–140)
POTASSIUM: 4 mmol/L (ref 3.5–5.1)
Sodium: 139 mmol/L (ref 136–145)
TOTAL PROTEIN: 7.9 g/dL (ref 6.4–8.3)

## 2017-07-01 LAB — D-DIMER, QUANTITATIVE (NOT AT ARMC)

## 2017-07-01 MED ORDER — IOPAMIDOL (ISOVUE-370) INJECTION 76%
INTRAVENOUS | Status: AC
  Filled 2017-07-01: qty 100

## 2017-07-01 MED ORDER — IOPAMIDOL (ISOVUE-370) INJECTION 76%
100.0000 mL | Freq: Once | INTRAVENOUS | Status: AC | PRN
  Administered 2017-07-01: 69 mL via INTRAVENOUS

## 2017-07-01 NOTE — Telephone Encounter (Signed)
Received call report from radiology. Negative for PE. Dr. Benay Spice aware. OK to discharge home.

## 2017-07-01 NOTE — Telephone Encounter (Signed)
Message from pt reporting "weird, jittery chest pain" she described it as chest heaviness. Pt also reports one incident of cold sweats overnight on 06/29/17. She requested appt. Reviewed with Dr. Benay Spice. Called pt with appt for this afternoon with APP.

## 2017-07-01 NOTE — Progress Notes (Signed)
[place] OFFICE PROGRESS NOTE   Diagnosis: Castleman's disease, paraganglioma  INTERVAL HISTORY:   [name] returns prior to scheduled follow-up for evaluation of chest discomfort.  On 06/28/2017 when she laid flat she felt like something was "weighing" on her chest.  This sensation was worse the following day, also when laying flat.  She had an episode of diaphoresis and diarrhea as well on 06/29/2017.  The sensation on her chest makes her feel nervous and anxious.  The sensation improves when she sits up but does not completely resolve.  No fever or cough.  No palpitations.  No leg swelling.  About 2 weeks ago she noted discomfort in the right calf when driving.  This has occurred intermittently since.  Objective: Vital signs in last 24 hours:  Blood pressure 137/88, pulse 99, temperature 98.5 F (36.9 C), temperature source Oral, resp. rate 18, height 4\' 11"  (1.499 m), weight 165 lb 6.4 oz (75 kg), SpO2 100 %.    HEENT: White coating over tongue. Resp: Lungs clear bilaterally. Cardio: Regular rate and rhythm.  No JVD. GI: Abdomen soft and nontender.  No hepatomegaly. Vascular: No leg edema.  Calves soft and nontender. Neuro: Alert and oriented. Skin: Warm, dry.   Lab Results:  Lab Results  Component Value Date   WBC 6.7 05/29/2017   HGB 14.3 05/29/2017   HCT 44.3 05/29/2017   MCV 90.7 05/29/2017   PLT 283 05/29/2017   NEUTROABS 4.3 05/29/2017    Imaging:  No results found.  Medications: I have reviewed the patient's current medications.  Assessment/Plan: 1. Castleman's disease. A CT of the chest 06/09/2009 confirmed a right paratracheal mass. A mediastinoscopy 06/21/2009 with biopsy of the mediastinal mass confirmed Castleman's disease. Staging CTs of the chest, abdomen and pelvis 08/12/2009 confirmed a right paratracheal mass, a superior mediastinal lymph node and a left cervical/supraclavicular lymph node. There was no evidence of Castleman's disease  in the abdomen or pelvis. A CT on 11/07/2009 was stable.  Restaging CTs 07/10/2013 confirmed worsening of a single level 2 right cervical lymph node compared to a CT from 11/07/2009. No significant change in paratracheal lymphadenopathy.  Chest x-ray 02/24/2014-stable right paratracheal soft tissue mass  Chest x-ray 08/23/2014-stable right paratracheal soft tissue mass  CT 11/29/2014 with a slight increase in the right cervical lymph node compared to 07/10/2013 and a stable right paratracheal node  CT chest 07/11/2016 with interval stability of hypervascular right paratracheal lymphadenopathy. No new or progressive disease in the chest.  CT neck 07/11/2016-enlarging heterogeneous hyperdense mass lesion within the right carotid space. Approximate 10 mm bilateral level II hyperdense lymph nodes stable.  CT chest 05/29/2017-no change in the right paratracheal mass, no evidence of progressive disease 2. G2 P2, status post delivery of her second child in January 2015 3. Bilateral salpingectomy 06/05/2013 4. Right anterior cervical lymph node on exam 07/03/2013--stable. 5. CT neck 07/11/2016-enlarging heterogeneous hyperdense mass lesion within the right carotid space. Approximate 10 mm bilateral level II hyperdense lymph nodes stable. CTangioneck 08/31/2016-large 3.9 cm hypervascular mass within the right carotid space;small but very similar contralateral left carotid space hypervascular lesion measuring 1.2 cm.  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    Disposition: [name] is a 37 year old woman with Castleman's disease and more recently diagnosed with a paraganglioma.  She presents today with an approximate 3-day history of chest discomfort, anxiety.  She had right calf pain about 2 weeks ago.  We referred her for a stat chest CT which was negative for PE.  The right paratracheal nodal mass  measured slightly larger.  There is no obvious explanation for her symptoms on the CT scan.  She will contact the office if symptoms persist.  Patient seen with Dr. Benay Spice.  25 minutes were spent face-to-face at today's visit with the majority of that time involved in counseling/coordination of care.   [name] ANP/GNP-BC   07/01/2017  2:56 PM  This was a shared visit with [name].  [name] was interviewed and examined.  The etiology of her symptoms is unclear.  We will check a d-dimer and refer her for a CT to rule out a pulmonary embolism and follow-up on the mediastinal mass.  It is possible her symptoms are related to a paraneoplastic phenomenon from the carotid body tumor or Castleman's disease. She will contact us if her symptoms do not improve.  [name], MD

## 2017-08-12 ENCOUNTER — Ambulatory Visit (INDEPENDENT_AMBULATORY_CARE_PROVIDER_SITE_OTHER): Payer: BLUE CROSS/BLUE SHIELD | Admitting: Surgery

## 2017-08-12 ENCOUNTER — Encounter: Payer: Self-pay | Admitting: Surgery

## 2017-08-12 VITALS — BP 131/94 | HR 100 | Temp 98.5°F | Resp 18 | Ht 59.0 in | Wt 165.0 lb

## 2017-08-12 DIAGNOSIS — D446 Neoplasm of uncertain behavior of carotid body: Secondary | ICD-10-CM

## 2017-08-12 NOTE — Progress Notes (Signed)
[facility] [place]  Patient name: Tracy Downs MRN: [id number] DOB: [birth year] Sex: female   REASON FOR VISIT:    Follow up  Tracy Downs:    Tracy Downs is a 37 y.o. female who returns today for follow-up.  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 Redmond Baseman.  She did have preoperative embolization by neuroradiology.  She reports no difficulties after her operation.  Her preoperative imaging revealed that her disease was bilateral.  She is back today to discuss resection of the left side which is smaller.  She denies any hoarseness or dysphagia.  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   PAST MEDICAL HISTORY:   Past Medical History:  Diagnosis Date  . 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 Deafness in left ear   . Pre-eclampsia 11/05/2016   during pregnancy only     FAMILY HISTORY:   Family History  Problem Relation Age of Onset  . Cancer Mother        breast  . Breast cancer Mother 15  . Kidney disease Brother   . Diabetes Maternal Grandmother   . Hypertension Maternal Grandmother     SOCIAL HISTORY:   Social History   Tobacco Use  . Smoking status: Never Smoker  . Smokeless tobacco: Never Used  Substance Use Topics  . Alcohol use: Yes    Comment: 11/08/2016 "might have a drink twice/month"     ALLERGIES:   Allergies  Allergen Reactions  . Hydrocodone Other (See Comments)    Urinary retention  . Amoxicillin Rash    Childhood allergy   . Penicillins Rash    Childhood allergy  Has patient had a PCN reaction causing immediate rash, facial/tongue/throat swelling, SOB or lightheadedness with hypotension: Yes Has patient had a PCN reaction causing severe rash involving mucus membranes or skin necrosis: Unknown Has patient had a PCN reaction that required hospitalization: No Has patient had a PCN  reaction occurring within the last 10 years: No If all of the above answers are "NO", then may proceed with Cephalosporin use.      CURRENT MEDICATIONS:   Current Outpatient Medications  Medication Sig Dispense Refill  . loratadine (CLARITIN) 10 MG tablet Take 10 mg by mouth daily as needed for allergies.    . Multiple Vitamins-Minerals (MULTIPLE VITAMINS/WOMENS PO) Take 1 tablet by mouth daily.     . Naphazoline HCl (CLEAR EYES OP) Place 2 drops into both eyes daily as needed (red/dry eyes).     No current facility-administered medications for this visit.     REVIEW OF SYSTEMS:   [X]  denotes positive finding, [ ]  denotes negative finding Cardiac  Comments:  Chest pain or chest pressure:    Shortness of breath upon exertion:    Short of breath when lying flat:    Irregular heart rhythm:        Vascular    Pain in calf, thigh, or hip brought on by ambulation:    Pain in feet at night that wakes you up from your sleep:     Blood clot in your veins:    Leg swelling:         Pulmonary    Oxygen at home:    Productive cough:     Wheezing:         Neurologic    Sudden weakness in arms or legs:  Sudden numbness in arms or legs:     Sudden onset of difficulty speaking or slurred speech:    Temporary loss of vision in one eye:     Problems with dizziness:         Gastrointestinal    Blood in stool:     Vomited blood:         Genitourinary    Burning when urinating:     Blood in urine:        Psychiatric    Major depression:         Hematologic    Bleeding problems:    Problems with blood clotting too easily:        Skin    Rashes or ulcers:        Constitutional    Fever or chills:      PHYSICAL EXAM:   Vitals:   08/12/17 1631 08/12/17 1639  BP: (!) 127/94 (!) 131/94  Pulse: 100   Resp: 18   Temp: 98.5 F (36.9 C)   TempSrc: Oral   SpO2: 100%   Weight: 165 lb (74.8 kg)   Height: 4\' 11"  (1.499 m)     GENERAL: The patient is a well-nourished  female, in no acute distress. The vital signs are documented above. CARDIAC: There is a regular rate and rhythm.  VASCULAR: Well-healed right neck incision PULMONARY: Non-labored respirations MUSCULOSKELETAL: There are no major deformities or cyanosis. NEUROLOGIC: No focal weakness or paresthesias are detected. SKIN: There are no ulcers or rashes noted. PSYCHIATRIC: The patient has a normal affect.  STUDIES:   None  MEDICAL ISSUES:   Carotid body tumor: We discussed that she would need resection of the left-sided tumor.  I have recommended preoperative embolization.  She is going to discuss the timing of the operation but is leaning towards July.  Having already undergone the operation on the right side, she understands the risks and benefits as well as the details of the procedure.  We will work in conjunction with Dr. Redmond Baseman to find a mutually acceptable time for surgery.    Annamarie Major, MD [facility] [phone] Pager [phone]

## 2017-09-25 ENCOUNTER — Encounter: Payer: Self-pay | Admitting: *Deleted

## 2017-09-26 ENCOUNTER — Other Ambulatory Visit (HOSPITAL_COMMUNITY): Payer: Self-pay | Admitting: Interventional Radiology

## 2017-09-26 DIAGNOSIS — D446 Neoplasm of uncertain behavior of carotid body: Secondary | ICD-10-CM

## 2017-10-02 ENCOUNTER — Other Ambulatory Visit: Payer: Self-pay | Admitting: *Deleted

## 2017-11-22 NOTE — Pre-Procedure Instructions (Signed)
Tracy Downs  11/22/2017      [id number] [address], [place] [address] Phone: [phone] Fax: [phone]    Your procedure is scheduled on Thursday August 22.  Report to [facility] Admitting at 5:30 A.M.  Call this number if you have problems the morning of surgery:  [phone]   Remember:  Do not eat or drink after midnight.    Take these medicines the morning of surgery with A SIP OF WATER: loratadine (claritin), naphazoline eye drops  7 days prior to surgery STOP taking any Aspirin(unless otherwise instructed by your surgeon), Aleve, Naproxen, Ibuprofen, Motrin, Advil, Goody's, BC's, all herbal medications, fish oil, and all vitamins     Do not wear jewelry, make-up or nail polish.  Do not wear lotions, powders, or perfumes, or deodorant.  Do not shave 48 hours prior to surgery.  Men may shave face and neck.  Do not bring valuables to the hospital.  [facility] is not responsible for any belongings or valuables.  Contacts, dentures or bridgework may not be worn into surgery.  Leave your suitcase in the car.  After surgery it may be brought to your room.  For patients admitted to the hospital, discharge time will be determined by your treatment team.  Patients discharged the day of surgery will not be allowed to drive home.   Special instructions:    [place]- Preparing For Surgery  Before surgery, you can play an important role. Because skin is not sterile, your skin needs to be as free of germs as possible. You can reduce the number of germs on your skin by washing with CHG (chlorahexidine gluconate) Soap before surgery.  CHG is an antiseptic cleaner which kills germs and bonds with the skin to continue killing germs even after washing.    Oral Hygiene is also important to reduce your risk of infection.  Remember - BRUSH YOUR TEETH THE MORNING OF SURGERY WITH YOUR REGULAR  TOOTHPASTE  Please do not use if you have an allergy to CHG or antibacterial soaps. If your skin becomes reddened/irritated stop using the CHG.  Do not shave (including legs and underarms) for at least 48 hours prior to first CHG shower. It is OK to shave your face.  Please follow these instructions carefully.   1. Shower the NIGHT BEFORE SURGERY and the MORNING OF SURGERY with CHG.   2. If you chose to wash your hair, wash your hair first as usual with your normal shampoo.  3. After you shampoo, rinse your hair and body thoroughly to remove the shampoo.  4. Use CHG as you would any other liquid soap. You can apply CHG directly to the skin and wash gently with a scrungie or a clean washcloth.   5. Apply the CHG Soap to your body ONLY FROM THE NECK DOWN.  Do not use on open wounds or open sores. Avoid contact with your eyes, ears, mouth and genitals (private parts). Wash Face and genitals (private parts)  with your normal soap.  6. Wash thoroughly, paying special attention to the area where your surgery will be performed.  7. Thoroughly rinse your body with warm water from the neck down.  8. DO NOT shower/wash with your normal soap after using and rinsing off the CHG Soap.  9. Pat yourself dry with a CLEAN TOWEL.  10. Wear CLEAN PAJAMAS to bed the night before surgery, wear comfortable clothes the  morning of surgery  11. Place CLEAN SHEETS on your bed the night of your first shower and DO NOT SLEEP WITH PETS.    Day of Surgery:  Do not apply any deodorants/lotions.  Please wear clean clothes to the hospital/surgery center.   Remember to brush your teeth WITH YOUR REGULAR TOOTHPASTE.    Please read over the following fact sheets that you were given. Coughing and Deep Breathing, MRSA Information and Surgical Site Infection Prevention

## 2017-11-25 ENCOUNTER — Other Ambulatory Visit: Payer: Self-pay | Admitting: Radiology

## 2017-11-25 ENCOUNTER — Encounter (HOSPITAL_COMMUNITY)
Admission: RE | Admit: 2017-11-25 | Discharge: 2017-11-25 | Disposition: A | Discharge status: Home / Self Care | Payer: BLUE CROSS/BLUE SHIELD | Source: Ambulatory Visit | Attending: Interventional Radiology | Admitting: Interventional Radiology

## 2017-11-25 ENCOUNTER — Encounter (HOSPITAL_COMMUNITY): Payer: Self-pay

## 2017-11-25 ENCOUNTER — Telehealth: Payer: Self-pay | Admitting: Oncology

## 2017-11-25 DIAGNOSIS — H9192 Unspecified hearing loss, left ear: Secondary | ICD-10-CM | POA: Diagnosis not present

## 2017-11-25 DIAGNOSIS — Z01812 Encounter for preprocedural laboratory examination: Secondary | ICD-10-CM | POA: Insufficient documentation

## 2017-11-25 DIAGNOSIS — D446 Neoplasm of uncertain behavior of carotid body: Secondary | ICD-10-CM | POA: Insufficient documentation

## 2017-11-25 DIAGNOSIS — R590 Localized enlarged lymph nodes: Secondary | ICD-10-CM | POA: Diagnosis not present

## 2017-11-25 DIAGNOSIS — D47Z2 Castleman disease: Secondary | ICD-10-CM | POA: Diagnosis not present

## 2017-11-25 LAB — CBC WITH DIFFERENTIAL/PLATELET
Abs Immature Granulocytes: 0 10*3/uL (ref 0.0–0.1)
Basophils Absolute: 0.1 10*3/uL (ref 0.0–0.1)
Basophils Relative: 1 %
EOS ABS: 0.2 10*3/uL (ref 0.0–0.7)
EOS PCT: 2 %
HEMATOCRIT: 48.5 % — AB (ref 36.0–46.0)
Hemoglobin: 14.9 g/dL (ref 12.0–15.0)
IMMATURE GRANULOCYTES: 0 %
LYMPHS ABS: 2 10*3/uL (ref 0.7–4.0)
Lymphocytes Relative: 25 %
MCH: 29.2 pg (ref 26.0–34.0)
MCHC: 30.7 g/dL (ref 30.0–36.0)
MCV: 95.1 fL (ref 78.0–100.0)
MONOS PCT: 6 %
Monocytes Absolute: 0.5 10*3/uL (ref 0.1–1.0)
NEUTROS PCT: 66 %
Neutro Abs: 5.5 10*3/uL (ref 1.7–7.7)
Platelets: 277 10*3/uL (ref 150–400)
RBC: 5.1 MIL/uL (ref 3.87–5.11)
RDW: 13.2 % (ref 11.5–15.5)
WBC: 8.3 10*3/uL (ref 4.0–10.5)

## 2017-11-25 LAB — COMPREHENSIVE METABOLIC PANEL
ALBUMIN: 4.3 g/dL (ref 3.5–5.0)
ALT: 38 U/L (ref 0–44)
AST: 35 U/L (ref 15–41)
Alkaline Phosphatase: 49 U/L (ref 38–126)
Anion gap: 10 (ref 5–15)
BUN: 12 mg/dL (ref 6–20)
CHLORIDE: 104 mmol/L (ref 98–111)
CO2: 24 mmol/L (ref 22–32)
Calcium: 9.5 mg/dL (ref 8.9–10.3)
Creatinine, Ser: 0.87 mg/dL (ref 0.44–1.00)
GFR calc Af Amer: >60 mL/min (ref >60)
GFR calc non Af Amer: >60 mL/min (ref >60)
GLUCOSE: 95 mg/dL (ref 70–99)
POTASSIUM: 4.5 mmol/L (ref 3.5–5.1)
SODIUM: 138 mmol/L (ref 135–145)
Total Bilirubin: 0.8 mg/dL (ref 0.3–1.2)
Total Protein: 7.3 g/dL (ref 6.5–8.1)

## 2017-11-25 LAB — URINALYSIS, ROUTINE W REFLEX MICROSCOPIC
BILIRUBIN URINE: NEGATIVE
Glucose, UA: NEGATIVE mg/dL
HGB URINE DIPSTICK: NEGATIVE
Ketones, ur: 5 mg/dL — AB
Leukocytes, UA: NEGATIVE
NITRITE: NEGATIVE
PH: 6 (ref 5.0–8.0)
Protein, ur: NEGATIVE mg/dL
SPECIFIC GRAVITY, URINE: 1.023 (ref 1.005–1.030)

## 2017-11-25 LAB — PROTIME-INR
INR: 0.96
Prothrombin Time: 12.7 seconds (ref 11.4–15.2)

## 2017-11-25 LAB — APTT: APTT: 28 s (ref 24–36)

## 2017-11-25 LAB — SURGICAL PCR SCREEN
MRSA, PCR: NEGATIVE
STAPHYLOCOCCUS AUREUS: NEGATIVE

## 2017-11-25 NOTE — Progress Notes (Addendum)
PCP  Helane Rima  MD  No history of cardiac problems  Never seen by cardiologist  Denies any cardiac testing  Had HTN during pregnancy but not on any treatment now or since birth of children.

## 2017-11-25 NOTE — Telephone Encounter (Signed)
R/s appt per 8/12 sch message - per patient request to r/s to September after surgery - pt aware of new appt date and time.

## 2017-11-25 NOTE — Pre-Procedure Instructions (Signed)
Hannalee KAELEI WHEELER  11/25/2017      [id number] [place], [place] [address] Phone: [phone] Fax: [phone]    Your procedure is scheduled on Wednesday December 04, 2017 .  Report to [facility] Admitting at 7:00  AM   Call this number if you have problems the morning of surgery:  [phone]   Remember:  Do not eat or drink after midnight.    Take these medicines the morning of surgery with A SIP OF WATER: loratadine (claritin), naphazoline eye drops  7 days prior to surgery STOP taking any Aspirin(unless otherwise instructed by your surgeon), Aleve, Naproxen, Ibuprofen, Motrin, Advil, Goody's, BC's, all herbal medications, fish oil, and all vitamins     Do not wear jewelry, make-up or nail polish.  Do not wear lotions, powders, or perfumes, or deodorant.  Do not shave 48 hours prior to surgery.  Men may shave face and neck.  Do not bring valuables to the hospital.  [facility] is not responsible for any belongings or valuables.  Contacts, dentures or bridgework may not be worn into surgery.  Leave your suitcase in the car.  After surgery it may be brought to your room.  For patients admitted to the hospital, discharge time will be determined by your treatment team.  Patients discharged the day of surgery will not be allowed to drive home.   Special instructions:    [place]- Preparing For Surgery  Before surgery, you can play an important role. Because skin is not sterile, your skin needs to be as free of germs as possible. You can reduce the number of germs on your skin by washing with CHG (chlorahexidine gluconate) Soap before surgery.  CHG is an antiseptic cleaner which kills germs and bonds with the skin to continue killing germs even after washing.    Oral Hygiene is also important to reduce your risk of infection.  Remember - BRUSH YOUR TEETH THE MORNING OF SURGERY WITH YOUR REGULAR  TOOTHPASTE  Please do not use if you have an allergy to CHG or antibacterial soaps. If your skin becomes reddened/irritated stop using the CHG.  Do not shave (including legs and underarms) for at least 48 hours prior to first CHG shower. It is OK to shave your face.  Please follow these instructions carefully.   1. Shower the NIGHT BEFORE SURGERY and the MORNING OF SURGERY with CHG.   2. If you chose to wash your hair, wash your hair first as usual with your normal shampoo.  3. After you shampoo, rinse your hair and body thoroughly to remove the shampoo.  4. Use CHG as you would any other liquid soap. You can apply CHG directly to the skin and wash gently with a scrungie or a clean washcloth.   5. Apply the CHG Soap to your body ONLY FROM THE NECK DOWN.  Do not use on open wounds or open sores. Avoid contact with your eyes, ears, mouth and genitals (private parts). Wash Face and genitals (private parts)  with your normal soap.  6. Wash thoroughly, paying special attention to the area where your surgery will be performed.  7. Thoroughly rinse your body with warm water from the neck down.  8. DO NOT shower/wash with your normal soap after using and rinsing off the CHG Soap.  9. Pat yourself dry with a CLEAN TOWEL.  10. Wear CLEAN PAJAMAS to bed the night before surgery,  wear comfortable clothes the morning of surgery  11. Place CLEAN SHEETS on your bed the night of your first shower and DO NOT SLEEP WITH PETS.    Day of Surgery:  Do not apply any deodorants/lotions.  Please wear clean clothes to the hospital/surgery center.   Remember to brush your teeth WITH YOUR REGULAR TOOTHPASTE.    Please read over the following fact sheets that you were given. Coughing and Deep Breathing, MRSA Information and Surgical Site Infection Prevention

## 2017-11-26 NOTE — Progress Notes (Signed)
Anesthesia Chart Review:  Case:  [id number] Date/Time:  12/04/17 0845   Procedure:  EMBOLIZATION (N/A )   Anesthesia type:  General   Pre-op diagnosis:  carotid artery tumor   Location:  [facility]   Surgeon:  Luanne Bras, MD      DISCUSSION: 37 yo female never smoker for above procedure. Pertinent hx includes Castleman disease, deafness in left ear. S/p cerebral arteriogram with embolization of right carotid body tumor on 11/08/2016 by Dr. Estanislado Pandy with subsequent right carotid body resection 11/09/16 by Dr. Trula Slade.  She is now scheduled to undergo similar procedure on the contralateral side. Previous procedure was without complication. Anticipate she can proceed as scheduled barring acute status change.  VS: BP 135/87   Pulse 73   Temp 36.8 C   Resp 20   Ht 4\' 11"  (1.499 m)   Wt 75.3 kg   LMP 11/07/2017   SpO2 97%   BMI 33.53 kg/m   PROVIDERS: Helane Rima, MD is PCP   LABS: Labs reviewed: Acceptable for surgery. (all labs ordered are listed, but only abnormal results are displayed)  Labs Reviewed  URINALYSIS, ROUTINE W REFLEX MICROSCOPIC - Abnormal; Notable for the following components:      Result Value   APPearance HAZY (*)    Ketones, ur 5 (*)    All other components within normal limits  CBC WITH DIFFERENTIAL/PLATELET - Abnormal; Notable for the following components:   HCT 48.5 (*)    All other components within normal limits  SURGICAL PCR SCREEN  APTT  COMPREHENSIVE METABOLIC PANEL  PROTIME-INR  TYPE AND SCREEN     IMAGES: CT Angio Chest PE 07/01/2017: FINDINGS: Cardiovascular: Pulmonary arterial opacification is excellent. No pulmonary emboli. No aortic atherosclerosis. No coronary artery calcification. Heart size is normal.  Mediastinum/Nodes: Right paratracheal nodal mass slightly larger, 3.5 x 4.1 cm in diameter compared with 3.3 x 3.8 cm previously. No other mediastinal finding.  Lungs/Pleura: Normal  Upper  Abdomen: Normal  Musculoskeletal: Normal  Review of the MIP images confirms the above findings.  IMPRESSION: No pulmonary emboli or other acute chest pathology.  Right paratracheal nodal mass consistent with the clinical diagnosis of Castleman disease. This may be minimally larger than seen previously, transverse diameter 3.5 x 4.1 cm compared with 3.1 x 3.8 cm previously.   EKG: N/A  CV: N/A  Past Medical History:  Diagnosis Date  . 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 Deafness in left ear   . Pre-eclampsia 11/05/2016   during pregnancy only    Past Surgical History:  Procedure Laterality Date  . ADENOIDECTOMY  1990s  . BILATERAL SALPINGECTOMY Bilateral 2015  . CAROTID ARTERY BODY TUMOR EMBOLIZATION  11/08/2016  . CAROTID BODY TUMOR EXCISION Right 11/09/2016   Procedure: TUMOR EXCISION CAROTID BODY, right;  Surgeon: Serafina Mitchell, MD;  Location: [facility] OR;  Service: Vascular;  Laterality: Right;  . castleman disease biopsy    . IR ANGIO EXTERNAL CAROTID SEL EXT CAROTID UNI R MOD SED  11/08/2016  . IR ANGIO INTRA EXTRACRAN SEL COM CAROTID INNOMINATE BILAT MOD SED  11/08/2016  . IR ANGIO VERTEBRAL SEL SUBCLAVIAN INNOMINATE UNI R MOD SED  11/08/2016  . IR ANGIO VERTEBRAL SEL VERTEBRAL UNI L MOD SED  11/08/2016  . IR ANGIOGRAM FOLLOW UP STUDY  11/08/2016  . IR ANGIOGRAM SELECTIVE EACH ADDITIONAL VESSEL  11/08/2016  . IR TRANSCATH/EMBOLIZ  11/08/2016  . LAPAROSCOPIC BILATERAL SALPINGECTOMY Bilateral 06/05/2013   Procedure: LAPAROSCOPIC BILATERAL  SALPINGECTOMY;  Surgeon: Lahoma Crocker, MD;  Location: [facility];  Service: Gynecology;  Laterality: Bilateral;  . LEEP    . RADIOLOGY WITH ANESTHESIA Right 11/08/2016   Procedure: Carotid artery body tumor embolization;  Surgeon: Luanne Bras, MD;  Location: [facility];  Service: Radiology;  Laterality: Right;    MEDICATIONS: . loratadine (CLARITIN) 10 MG tablet  . Multiple Vitamin (MULTIVITAMIN WITH MINERALS) TABS tablet  . Naphazoline  HCl (CLEAR EYES OP)   No current facility-administered medications for this encounter.     Wynonia Musty [facility] Short Stay Center/Anesthesiology Phone [phone] 11/26/2017 3:51 PM

## 2017-11-28 ENCOUNTER — Ambulatory Visit: Payer: BLUE CROSS/BLUE SHIELD | Admitting: Nurse Practitioner

## 2017-12-03 ENCOUNTER — Other Ambulatory Visit: Payer: Self-pay | Admitting: Student

## 2017-12-03 ENCOUNTER — Other Ambulatory Visit: Payer: Self-pay | Admitting: Radiology

## 2017-12-04 ENCOUNTER — Ambulatory Visit (HOSPITAL_COMMUNITY)
Admission: RE | Admit: 2017-12-04 | Discharge: 2017-12-04 | Disposition: A | Discharge status: Home / Self Care | Payer: BLUE CROSS/BLUE SHIELD | Source: Ambulatory Visit | Attending: Interventional Radiology | Admitting: Interventional Radiology

## 2017-12-04 ENCOUNTER — Other Ambulatory Visit: Payer: Self-pay

## 2017-12-04 ENCOUNTER — Encounter (HOSPITAL_COMMUNITY): Payer: Self-pay | Admitting: Surgery

## 2017-12-04 ENCOUNTER — Encounter (HOSPITAL_COMMUNITY): Payer: Self-pay | Admitting: *Deleted

## 2017-12-04 ENCOUNTER — Encounter (HOSPITAL_COMMUNITY)
Admission: RE | Disposition: A | Discharge status: Home / Self Care | Payer: Self-pay | Source: Ambulatory Visit | Attending: Interventional Radiology

## 2017-12-04 ENCOUNTER — Encounter (HOSPITAL_COMMUNITY): Payer: Self-pay

## 2017-12-04 ENCOUNTER — Ambulatory Visit (HOSPITAL_COMMUNITY): Payer: BLUE CROSS/BLUE SHIELD | Admitting: Certified Registered Nurse Anesthetist

## 2017-12-04 ENCOUNTER — Ambulatory Visit (HOSPITAL_COMMUNITY): Payer: BLUE CROSS/BLUE SHIELD | Admitting: Physician Assistant

## 2017-12-04 DIAGNOSIS — Z6832 Body mass index (BMI) 32.0-32.9, adult: Secondary | ICD-10-CM

## 2017-12-04 DIAGNOSIS — D446 Neoplasm of uncertain behavior of carotid body: Secondary | ICD-10-CM

## 2017-12-04 DIAGNOSIS — E669 Obesity, unspecified: Secondary | ICD-10-CM | POA: Insufficient documentation

## 2017-12-04 DIAGNOSIS — D47Z2 Castleman disease: Secondary | ICD-10-CM

## 2017-12-04 DIAGNOSIS — Z9889 Other specified postprocedural states: Secondary | ICD-10-CM

## 2017-12-04 DIAGNOSIS — Z885 Allergy status to narcotic agent status: Secondary | ICD-10-CM

## 2017-12-04 DIAGNOSIS — Z8759 Personal history of other complications of pregnancy, childbirth and the puerperium: Secondary | ICD-10-CM

## 2017-12-04 DIAGNOSIS — Z90722 Acquired absence of ovaries, bilateral: Secondary | ICD-10-CM

## 2017-12-04 DIAGNOSIS — H9192 Unspecified hearing loss, left ear: Secondary | ICD-10-CM

## 2017-12-04 DIAGNOSIS — Z880 Allergy status to penicillin: Secondary | ICD-10-CM

## 2017-12-04 DIAGNOSIS — I10 Essential (primary) hypertension: Secondary | ICD-10-CM

## 2017-12-04 HISTORY — PX: IR ANGIO VERTEBRAL SEL SUBCLAVIAN INNOMINATE UNI R MOD SED: IMG5365

## 2017-12-04 HISTORY — PX: IR ANGIO INTRA EXTRACRAN SEL COM CAROTID INNOMINATE BILAT MOD SED: IMG5360

## 2017-12-04 HISTORY — PX: RADIOLOGY WITH ANESTHESIA: SHX6223

## 2017-12-04 HISTORY — PX: IR ANGIO EXTERNAL CAROTID SEL EXT CAROTID UNI L MOD SED: IMG5370

## 2017-12-04 LAB — POCT PREGNANCY, URINE: Preg Test, Ur: NEGATIVE

## 2017-12-04 SURGERY — IR WITH ANESTHESIA
Anesthesia: General

## 2017-12-04 MED ORDER — LIDOCAINE HCL 1 % IJ SOLN
INTRAMUSCULAR | Status: AC
  Filled 2017-12-04: qty 20

## 2017-12-04 MED ORDER — SODIUM CHLORIDE 0.9 % IV SOLN: INTRAVENOUS | Status: DC

## 2017-12-04 MED ORDER — VANCOMYCIN HCL 1000 MG IV SOLR: 1000.0000 mg | INTRAVENOUS | Status: DC

## 2017-12-04 MED ORDER — LACTATED RINGERS IV SOLN
INTRAVENOUS | Status: DC
  Administered 2017-12-04: 09:00:00 via INTRAVENOUS

## 2017-12-04 MED ORDER — VANCOMYCIN HCL IN DEXTROSE 1-5 GM/200ML-% IV SOLN
1000.0000 mg | INTRAVENOUS | Status: DC
  Filled 2017-12-04: qty 200

## 2017-12-04 MED ORDER — NIMODIPINE 30 MG PO CAPS
60.0000 mg | ORAL_CAPSULE | Freq: Once | ORAL | Status: AC
  Administered 2017-12-04: 30 mg via ORAL
  Filled 2017-12-04: qty 2

## 2017-12-04 MED ORDER — FENTANYL CITRATE (PF) 100 MCG/2ML IJ SOLN
INTRAMUSCULAR | Status: DC | PRN
  Administered 2017-12-04 (×4): 25 ug via INTRAVENOUS

## 2017-12-04 MED ORDER — IOHEXOL 300 MG/ML  SOLN: 66.0000 mL | Freq: Once | INTRAMUSCULAR | Status: DC | PRN

## 2017-12-04 MED ORDER — LIDOCAINE HCL (PF) 1 % IJ SOLN
INTRAMUSCULAR | Status: DC | PRN
  Administered 2017-12-04: 10 mL

## 2017-12-04 MED ORDER — HEPARIN SODIUM (PORCINE) 1000 UNIT/ML IJ SOLN
INTRAMUSCULAR | Status: DC | PRN
  Administered 2017-12-04: 1000 [IU] via INTRAVENOUS

## 2017-12-04 MED ORDER — MIDAZOLAM HCL 5 MG/5ML IJ SOLN
INTRAMUSCULAR | Status: DC | PRN
  Administered 2017-12-04 (×2): 1 mg via INTRAVENOUS

## 2017-12-04 NOTE — Procedures (Signed)
S/P bilateral common carotid and Lt ECA arteriograms. RT CFA approach. Findings. 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

## 2017-12-04 NOTE — Transfer of Care (Signed)
Immediate Anesthesia Transfer of Care Note  Patient: Tracy Downs  Procedure(s) Performed: EMBOLIZATION (N/A )  Patient Location: PACU  Anesthesia Type:MAC  Level of Consciousness: awake, alert , oriented and patient cooperative  Airway & Oxygen Therapy: Patient Spontanous Breathing and Patient connected to nasal cannula oxygen  Post-op Assessment: Report given to RN, Post -op Vital signs reviewed and stable and Patient moving all extremities X 4  Post vital signs: Reviewed and stable  Last Vitals:  Vitals Value Taken Time  BP 114/85 12/04/2017 10:45 AM  Temp 36.5 C 12/04/2017 10:45 AM  Pulse 79 12/04/2017 10:54 AM  Resp 16 12/04/2017 10:54 AM  SpO2 100 % 12/04/2017 10:54 AM  Vitals shown include unvalidated device data.  Last Pain:  Vitals:   12/04/17 1045  TempSrc:   PainSc: 0-No pain      Patients Stated Pain Goal: 1 (37/85/88 5027)  Complications: No apparent anesthesia complications

## 2017-12-04 NOTE — Anesthesia Preprocedure Evaluation (Addendum)
Anesthesia Evaluation  Patient identified by MRN, date of birth, ID band Patient awake    Reviewed: Allergy & Precautions, H&P , NPO status , Patient's Chart, lab work & pertinent test results  History of Anesthesia Complications Negative for: history of anesthetic complications  Airway Mallampati: III       Dental no notable dental hx. (+) Teeth Intact, Dental Advisory Given   Pulmonary  Mediastinal lymphadenopathy   Pulmonary exam normal breath sounds clear to auscultation       Cardiovascular hypertension, Normal cardiovascular exam Rhythm:Regular Rate:Normal  Hx/o PIH Carotid body tumor   Neuro/Psych negative neurological ROS  negative psych ROS   GI/Hepatic negative GI ROS, Neg liver ROS,   Endo/Other  negative endocrine ROS  Renal/GU negative Renal ROS  negative genitourinary   Musculoskeletal negative musculoskeletal ROS (+)   Abdominal (+) + obese,   Peds  Hematology  (+) Blood dyscrasia, , Castleman's disease   Anesthesia Other Findings   Reproductive/Obstetrics Undesired fertility                             Anesthesia Physical  Anesthesia Plan  ASA: II  Anesthesia Plan: MAC   Post-op Pain Management:    Induction: Intravenous  PONV Risk Score and Plan: 2 and Ondansetron, Dexamethasone and Treatment may vary due to age or medical condition  Airway Management Planned: Natural Airway and Simple Face Mask  Additional Equipment: Arterial line  Intra-op Plan:   Post-operative Plan: Extubation in OR  Informed Consent: I have reviewed the patients History and Physical, chart, labs and discussed the procedure including the risks, benefits and alternatives for the proposed anesthesia with the patient or authorized representative who has indicated his/her understanding and acceptance.     Plan Discussed with: CRNA, Anesthesiologist and Surgeon  Anesthesia Plan  Comments: (Castleman disease (CD) is a rare disease of lymph nodes and related tissues.It was first described by Dr. Marland Kitchen Castleman in the 48s. It is also known as Castleman's disease, giant lymph node hyperplasia, and angiofollicular lymph node hyperplasia (AFH). CD is not cancer but a lymphoproliferative disorder.)      Anesthesia Quick Evaluation

## 2017-12-04 NOTE — Progress Notes (Signed)
Pt denies SOB, chest pain, and being under the care of a cardiologist. Pt denies having a stress test, echo and cardiac cath. Pt denies having an EKG and chest x ray. Pt made aware to stop taking vitamins, fish oil and herbal medications . Do not take any NSAIDs ie: Ibuprofen, Advil, Naproxen (Aleve), Motrin, BC and Goody Powder. Pt verbalized understanding of all pre-op instructions.

## 2017-12-04 NOTE — Sedation Documentation (Signed)
Per Dr. Tommie Raymond, he does not want patient to have foley catheter placed at this time.

## 2017-12-04 NOTE — Anesthesia Procedure Notes (Signed)
Procedure Name: MAC Date/Time: 12/04/2017 9:14 AM Performed by: Colin Benton, CRNA Pre-anesthesia Checklist: Patient identified, Emergency Drugs available, Suction available and Patient being monitored Patient Re-evaluated:Patient Re-evaluated prior to induction Oxygen Delivery Method: Nasal cannula Placement Confirmation: positive ETCO2 Dental Injury: Teeth and Oropharynx as per pre-operative assessment

## 2017-12-04 NOTE — Discharge Instructions (Signed)
Cerebral Angiogram, Care After °Refer to this sheet in the next few weeks. These instructions provide you with information on caring for yourself after your procedure. Your health care provider may also give you more specific instructions. Your treatment has been planned according to current medical practices, but problems sometimes occur. Call your health care provider if you have any problems or questions after your procedure. °What can I expect after the procedure? °After your procedure, it is typical to have the following: °· Bruising at the catheter insertion site that usually fades within 1-2 weeks. °· Blood collecting in the tissue (hematoma) that may be painful to the touch. It should usually decrease in size and tenderness within 1-2 weeks. °· A mild headache. ° °Follow these instructions at home: °· Take medicines only as directed by your health care provider. °· You may shower 24-48 hours after the procedure or as directed by your health care provider. Remove the bandage (dressing) and gently wash the site with plain soap and water. Pat the area dry with a clean towel. Do not rub the site, because this may cause bleeding. °· Do not take baths, swim, or use a hot tub until your health care provider approves. °· Check your insertion site every day for redness, swelling, or drainage. °· Do not apply powder or lotion to the site. °· Do not lift over 10 lb (4.5 kg) for 5 days after your procedure or as directed by your health care provider. °· Ask your health care provider when it is okay to: °? Return to work or school. °? Resume usual physical activities or sports. °? Resume sexual activity. °· Do not drive home if you are discharged the same day as the procedure. Have someone else drive you. °· You may drive 24 hours after the procedure unless otherwise instructed by your health care provider. °· Do not operate machinery or power tools for 24 hours after the procedure or as directed by your health care  provider. °· If your procedure was done as an outpatient procedure, which means that you went home the same day as your procedure, a responsible adult should be with you for the first 24 hours after you arrive home. °· Keep all follow-up visits as directed by your health care provider. This is important. °Contact a health care provider if: °· You have a fever. °· You have chills. °· You have increased bleeding from the catheter insertion site. Hold pressure on the site. °Get help right away if: °· You have vision changes or loss of vision. °· You have numbness or weakness on one side of your body. °· You have difficulty talking, or you have slurred speech or cannot speak (aphasia). °· You feel confused or have difficulty remembering. °· You have unusual pain at the catheter insertion site. °· You have redness, warmth, or swelling at the catheter insertion site. °· You have drainage (other than a small amount of blood on the dressing) from the catheter insertion site. °· The catheter insertion site is bleeding, and the bleeding does not stop after 30 minutes of holding steady pressure on the site. °These symptoms may represent a serious problem that is an emergency. Do not wait to see if the symptoms will go away. Get medical help right away. Call your local emergency services (911 in [place] Do not drive yourself to the hospital. °This information is not intended to replace advice given to you by your health care provider. Make sure you discuss any questions   you have with your health care provider. °Document Released: 08/17/2013 Document Revised: 09/08/2015 Document Reviewed: 04/15/2013 °Elsevier Interactive Patient Education © 2017 [employer] °Moderate Conscious Sedation, Adult, Care After °These instructions provide you with information about caring for yourself after your procedure. Your health care provider may also give you more specific instructions. Your treatment has been planned according to current  medical practices, but problems sometimes occur. Call your health care provider if you have any problems or questions after your procedure. °What can I expect after the procedure? °After your procedure, it is common: °· To feel sleepy for several hours. °· To feel clumsy and have poor balance for several hours. °· To have poor judgment for several hours. °· To vomit if you eat too soon. ° °Follow these instructions at home: °For at least 24 hours after the procedure: ° °· Do not: °? Participate in activities where you could fall or become injured. °? Drive. °? Use heavy machinery. °? Drink alcohol. °? Take sleeping pills or medicines that cause drowsiness. °? Make important decisions or sign legal documents. °? Take care of children on your own. °· Rest. °Eating and drinking °· Follow the diet recommended by your health care provider. °· If you vomit: °? Drink water, juice, or soup when you can drink without vomiting. °? Make sure you have little or no nausea before eating solid foods. °General instructions °· Have a responsible adult stay with you until you are awake and alert. °· Take over-the-counter and prescription medicines only as told by your health care provider. °· If you smoke, do not smoke without supervision. °· Keep all follow-up visits as told by your health care provider. This is important. °Contact a health care provider if: °· You keep feeling nauseous or you keep vomiting. °· You feel light-headed. °· You develop a rash. °· You have a fever. °Get help right away if: °· You have trouble breathing. °This information is not intended to replace advice given to you by your health care provider. Make sure you discuss any questions you have with your health care provider. °Document Released: 01/21/2013 Document Revised: 09/05/2015 Document Reviewed: 07/23/2015 °Elsevier Interactive Patient Education © 2018 Elsevier Inc. ° °

## 2017-12-04 NOTE — H&P (Deleted)
  The note originally documented on this encounter has been moved the the encounter in which it belongs.  

## 2017-12-04 NOTE — H&P (Signed)
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 Redmond Baseman.  She did have preoperative embolization by neuroradiology.  She reports no difficulties after her operation.  Her preoperative imaging revealed that her disease was bilateral.  She is back today to discuss resection of the left side which is smaller.  She denies any hoarseness or dysphagia.  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  I have recommended preoperative embolization.  Pt s/p right carotid body tumor embolization 10/2016 in IR Surgery following day with Dr Trula Slade and Dr Lysle Morales with Dr Trula Slade-- scheduled now for cerebral arteriogram and possible embolization contrlateral side prior to left carotid body tumor surgery tomorrow.   Past Medical History:  Diagnosis Date  . 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 Deafness in left ear   . Pre-eclampsia 11/05/2016   during pregnancy only    Past Surgical History:  Procedure Laterality Date  . ADENOIDECTOMY  1990s  . BILATERAL SALPINGECTOMY Bilateral 2015  . CAROTID ARTERY BODY TUMOR EMBOLIZATION  11/08/2016  . CAROTID BODY TUMOR EXCISION Right 11/09/2016   Procedure: TUMOR EXCISION CAROTID BODY, right;  Surgeon: Serafina Mitchell, MD;  Location: [facility] OR;  Service: Vascular;  Laterality: Right;  . castleman disease biopsy    . IR ANGIO EXTERNAL CAROTID SEL EXT CAROTID UNI R MOD SED  11/08/2016  . IR ANGIO INTRA EXTRACRAN SEL COM CAROTID INNOMINATE BILAT MOD SED  11/08/2016  . IR ANGIO  VERTEBRAL SEL SUBCLAVIAN INNOMINATE UNI R MOD SED  11/08/2016  . IR ANGIO VERTEBRAL SEL VERTEBRAL UNI L MOD SED  11/08/2016  . IR ANGIOGRAM FOLLOW UP STUDY  11/08/2016  . IR ANGIOGRAM SELECTIVE EACH ADDITIONAL VESSEL  11/08/2016  . IR TRANSCATH/EMBOLIZ  11/08/2016  . LAPAROSCOPIC BILATERAL SALPINGECTOMY Bilateral 06/05/2013   Procedure: LAPAROSCOPIC BILATERAL SALPINGECTOMY;  Surgeon: Lahoma Crocker, MD;  Location: [facility];  Service: Gynecology;  Laterality: Bilateral;  . LEEP    . RADIOLOGY WITH ANESTHESIA Right 11/08/2016   Procedure: Carotid artery body tumor embolization;  Surgeon: Luanne Bras, MD;  Location: [facility];  Service: Radiology;  Laterality: Right;    Allergies: Amoxicillin; Hydrocodone; and Penicillins  Medications: Prior to Admission medications   Medication Sig Start Date End Date Taking? Authorizing Provider  loratadine (CLARITIN) 10 MG tablet Take 10 mg by mouth daily as needed for allergies.    [provider]  Multiple Vitamin (MULTIVITAMIN WITH MINERALS) TABS tablet Take 1 tablet by mouth daily.    [provider]  Naphazoline HCl (CLEAR EYES OP) Place 2 drops into both eyes daily.     [provider]     Family History  Problem Relation Age of Onset  . Cancer Mother        breast  . Breast cancer Mother 72  . Kidney disease Brother   . Diabetes Maternal Grandmother   . Hypertension Maternal Grandmother     Social History   Socioeconomic History  . Marital status: Married    Spouse name: Not on  file  . Number of children: Not on file  . Years of education: Not on file  . Highest education level: Not on file  Occupational History  . Not on file  Social Needs  . Financial resource strain: Not on file  . Food insecurity:    Worry: Not on file    Inability: Not on file  . Transportation needs:    Medical: Not on file    Non-medical: Not on file  Tobacco Use  . Smoking status: Never Smoker  . Smokeless tobacco: Never  Used  Substance and Sexual Activity  . Alcohol use: Yes    Comment: 11/08/2016 "might have a drink twice/month"  . Drug use: No  . Sexual activity: Yes    Partners: Male    Birth control/protection: None  Lifestyle  . Physical activity:    Days per week: Not on file    Minutes per session: Not on file  . Stress: Not on file  Relationships  . Social connections:    Talks on phone: Not on file    Gets together: Not on file    Attends religious service: Not on file    Active member of club or organization: Not on file    Attends meetings of clubs or organizations: Not on file    Relationship status: Not on file  Other Topics Concern  . Not on file  Social History Narrative  . Not on file    Review of Systems: A 12 point ROS discussed and pertinent positives are indicated in the HPI above.  All other systems are negative.  Review of Systems  Constitutional: Negative for activity change, fatigue and fever.  HENT: Negative for tinnitus.   Eyes: Negative for visual disturbance.  Respiratory: Negative for cough and shortness of breath.   Cardiovascular: Negative for chest pain.  Gastrointestinal: Negative for abdominal pain.  Musculoskeletal: Negative for back pain and gait problem.  Neurological: Negative for dizziness, tremors, seizures, syncope, facial asymmetry, speech difficulty, weakness, light-headedness, numbness and headaches.  Psychiatric/Behavioral: Negative for behavioral problems and confusion.    Vital Signs: LMP 11/07/2017   Physical Exam  Constitutional: She is oriented to person, place, and time. She appears well-nourished.  HENT:  Head: Atraumatic.  Eyes: EOM are normal.  Neck: Neck supple.  Cardiovascular: Normal rate, regular rhythm and normal heart sounds.  Pulmonary/Chest: Effort normal and breath sounds normal. She has no wheezes.  Abdominal: Soft. Bowel sounds are normal. There is no tenderness.  Musculoskeletal: Normal range of motion.   Neurological: She is alert and oriented to person, place, and time.  Skin: Skin is warm and dry.  Psychiatric: She has a normal mood and affect. Her behavior is normal. Judgment and thought content normal.  Vitals reviewed.   Imaging: No results found.  Labs:  CBC: Recent Labs    05/29/17 0813 11/25/17 0855  WBC 6.7 8.3  HGB 14.3 14.9  HCT 44.3 48.5*  PLT 283 277    COAGS: Recent Labs    11/25/17 0855  INR 0.96  APTT 28    BMP: Recent Labs    05/29/17 0813 07/01/17 1550 11/25/17 0855  NA 139 139 138  K 4.3 4.0 4.5  CL 104 104 104  CO2 26 25 24   GLUCOSE 86 89 95  BUN 10 12 12   CALCIUM 9.5 9.9 9.5  CREATININE 0.91 0.94 0.87  GFRNONAA >60 >60 >60  GFRAA >60 >60 >60    LIVER FUNCTION TESTS: Recent Labs  05/29/17 0813 07/01/17 1550 11/25/17 0855  BILITOT 0.5 0.4 0.8  AST 24 23 35  ALT 25 26 38  ALKPHOS 59 57 49  PROT 7.4 7.9 7.3  ALBUMIN 4.2 4.5 4.3    TUMOR MARKERS: No results for input(s): AFPTM, CEA, CA199, CHROMGRNA in the last 8760 hours.  Assessment and Plan:  Carotid body tumor; Castleman's dosease Right side embolization 10/2016 in IR with Dr Estanislado Pandy Surgery followed with Dr Trula Slade and Dr Redmond Baseman Now scheduled for possible left side embolization prior to surgery scheduled for tomorrow Risks and benefits of cerebral angiogram with intervention were discussed with the patient including, but not limited to bleeding, infection, vascular injury, contrast induced renal failure, stroke or even death.  This interventional procedure involves the use of X-rays and because of the nature of the planned procedure, it is possible that we will have prolonged use of X-ray fluoroscopy.  Potential radiation risks to you include (but are not limited to) the following: - A slightly elevated risk for cancer  several years later in life. This risk is typically less than 0.5% percent. This risk is low in comparison to the normal incidence of human cancer,  which is 33% for women and 50% for men according to the [employer]. - Radiation induced injury can include skin redness, resembling a rash, tissue breakdown / ulcers and hair loss (which can be temporary or permanent).   The likelihood of either of these occurring depends on the difficulty of the procedure and whether you are sensitive to radiation due to previous procedures, disease, or genetic conditions.   IF your procedure requires a prolonged use of radiation, you will be notified and given written instructions for further action.  It is your responsibility to monitor the irradiated area for the 2 weeks following the procedure and to notify your physician if you are concerned that you have suffered a radiation induced injury.    All of the patient's questions were answered, patient is agreeable to proceed.  Consent signed and in chart  Thank you for this interesting consult.  I greatly enjoyed meeting Tracy Downs and look forward to participating in their care.  A copy of this report was sent to the requesting provider on this date.  Electronically Signed: Lavonia Drafts, PA-C 12/04/2017, 7:46 AM   I spent a total of  30 Minutes   in face to face in clinical consultation, greater than 50% of which was counseling/coordinating care for cerebral arteriogram/possible left carotid body tumor embolization

## 2017-12-04 NOTE — Sedation Documentation (Signed)
Right groin checked with Nira Conn, RN, PACU.

## 2017-12-04 NOTE — Anesthesia Postprocedure Evaluation (Addendum)
Anesthesia Post Note  Patient: Tracy Downs  Procedure(s) Performed: EMBOLIZATION (N/A )     Patient location during evaluation: PACU Anesthesia Type: MAC Level of consciousness: awake and alert Pain management: pain level controlled Vital Signs Assessment: post-procedure vital signs reviewed and stable Respiratory status: spontaneous breathing and respiratory function stable Cardiovascular status: stable Postop Assessment: no apparent nausea or vomiting Anesthetic complications: no    Last Vitals:  Vitals:   12/04/17 1135 12/04/17 1140  BP:    Pulse: 78 70  Resp: 17 15  Temp:    SpO2: 100% 100%    Last Pain:  Vitals:   12/04/17 1130  TempSrc:   PainSc: Asleep                 Vora Clover DANIEL

## 2017-12-05 ENCOUNTER — Encounter (HOSPITAL_COMMUNITY)
Admission: RE | Disposition: A | Discharge status: Home / Self Care | Payer: Self-pay | Source: Home / Self Care | Attending: Surgery

## 2017-12-05 ENCOUNTER — Inpatient Hospital Stay (HOSPITAL_COMMUNITY)
Admission: RE | Admit: 2017-12-05 | Discharge: 2017-12-06 | DRG: 982 | Disposition: A | Discharge status: Home / Self Care | Payer: BLUE CROSS/BLUE SHIELD | Attending: Surgery | Admitting: Surgery

## 2017-12-05 ENCOUNTER — Inpatient Hospital Stay (HOSPITAL_COMMUNITY): Payer: BLUE CROSS/BLUE SHIELD | Admitting: Anesthesiology

## 2017-12-05 ENCOUNTER — Encounter (HOSPITAL_COMMUNITY): Payer: Self-pay | Admitting: Anesthesiology

## 2017-12-05 DIAGNOSIS — I10 Essential (primary) hypertension: Secondary | ICD-10-CM | POA: Diagnosis present

## 2017-12-05 DIAGNOSIS — D47Z2 Castleman disease: Secondary | ICD-10-CM | POA: Diagnosis present

## 2017-12-05 DIAGNOSIS — D446 Neoplasm of uncertain behavior of carotid body: Principal | ICD-10-CM | POA: Diagnosis present

## 2017-12-05 HISTORY — DX: Neoplasm of unspecified behavior of unspecified site: D49.9

## 2017-12-05 HISTORY — PX: CAROTID BODY TUMOR EXCISION: SHX5156

## 2017-12-05 LAB — TYPE AND SCREEN
ABO/RH(D): B POS
ABO/RH(D): B POS
ANTIBODY SCREEN: NEGATIVE
Antibody Screen: NEGATIVE

## 2017-12-05 SURGERY — EXCISION, NEOPLASM, CAROTID BODY
Anesthesia: General | Site: Neck | Laterality: Left

## 2017-12-05 MED ORDER — PROPOFOL 10 MG/ML IV BOLUS
INTRAVENOUS | Status: AC
  Filled 2017-12-05: qty 20

## 2017-12-05 MED ORDER — LIDOCAINE 2% (20 MG/ML) 5 ML SYRINGE
INTRAMUSCULAR | Status: AC
  Filled 2017-12-05: qty 5

## 2017-12-05 MED ORDER — LIDOCAINE-EPINEPHRINE 1 %-1:100000 IJ SOLN
INTRAMUSCULAR | Status: DC | PRN
  Administered 2017-12-05: 4 mL

## 2017-12-05 MED ORDER — SODIUM CHLORIDE 0.9 % IV SOLN: 500.0000 mL | Freq: Once | INTRAVENOUS | Status: DC | PRN

## 2017-12-05 MED ORDER — LACTATED RINGERS IV SOLN
INTRAVENOUS | Status: DC | PRN
  Administered 2017-12-05: 08:00:00 via INTRAVENOUS

## 2017-12-05 MED ORDER — METOPROLOL TARTRATE 5 MG/5ML IV SOLN: 2.0000 mg | INTRAVENOUS | Status: DC | PRN

## 2017-12-05 MED ORDER — DOCUSATE SODIUM 100 MG PO CAPS
100.0000 mg | ORAL_CAPSULE | Freq: Every day | ORAL | Status: DC
  Administered 2017-12-06: 100 mg via ORAL
  Filled 2017-12-05: qty 1

## 2017-12-05 MED ORDER — HYDRALAZINE HCL 20 MG/ML IJ SOLN: 5.0000 mg | INTRAMUSCULAR | Status: DC | PRN

## 2017-12-05 MED ORDER — ONDANSETRON HCL 4 MG/2ML IJ SOLN
INTRAMUSCULAR | Status: AC
  Filled 2017-12-05: qty 2

## 2017-12-05 MED ORDER — CHLORHEXIDINE GLUCONATE 4 % EX LIQD: 60.0000 mL | Freq: Once | CUTANEOUS | Status: DC

## 2017-12-05 MED ORDER — ONDANSETRON HCL 4 MG/2ML IJ SOLN: 4.0000 mg | Freq: Once | INTRAMUSCULAR | Status: DC | PRN

## 2017-12-05 MED ORDER — MAGNESIUM SULFATE 2 GM/50ML IV SOLN: 2.0000 g | Freq: Every day | INTRAVENOUS | Status: DC | PRN

## 2017-12-05 MED ORDER — PANTOPRAZOLE SODIUM 40 MG PO TBEC
40.0000 mg | DELAYED_RELEASE_TABLET | Freq: Every day | ORAL | Status: DC
  Administered 2017-12-06: 40 mg via ORAL
  Filled 2017-12-05: qty 1

## 2017-12-05 MED ORDER — ROCURONIUM BROMIDE 10 MG/ML (PF) SYRINGE
PREFILLED_SYRINGE | INTRAVENOUS | Status: DC | PRN
  Administered 2017-12-05: 50 mg via INTRAVENOUS

## 2017-12-05 MED ORDER — FENTANYL CITRATE (PF) 250 MCG/5ML IJ SOLN
INTRAMUSCULAR | Status: DC | PRN
  Administered 2017-12-05: 100 ug via INTRAVENOUS
  Administered 2017-12-05 (×3): 50 ug via INTRAVENOUS

## 2017-12-05 MED ORDER — LABETALOL HCL 5 MG/ML IV SOLN
10.0000 mg | INTRAVENOUS | Status: DC | PRN
  Administered 2017-12-05: 10 mg via INTRAVENOUS

## 2017-12-05 MED ORDER — SODIUM CHLORIDE 0.9 % IV SOLN
INTRAVENOUS | Status: DC | PRN
  Administered 2017-12-05: 10 ug/min via INTRAVENOUS

## 2017-12-05 MED ORDER — FENTANYL CITRATE (PF) 250 MCG/5ML IJ SOLN
INTRAMUSCULAR | Status: AC
  Filled 2017-12-05: qty 5

## 2017-12-05 MED ORDER — LABETALOL HCL 5 MG/ML IV SOLN
INTRAVENOUS | Status: AC
  Filled 2017-12-05: qty 4

## 2017-12-05 MED ORDER — LIDOCAINE-EPINEPHRINE 1 %-1:100000 IJ SOLN
INTRAMUSCULAR | Status: AC
  Filled 2017-12-05: qty 1

## 2017-12-05 MED ORDER — ONDANSETRON HCL 4 MG/2ML IJ SOLN: 4.0000 mg | Freq: Four times a day (QID) | INTRAMUSCULAR | Status: DC | PRN

## 2017-12-05 MED ORDER — FENTANYL CITRATE (PF) 100 MCG/2ML IJ SOLN: 25.0000 ug | INTRAMUSCULAR | Status: DC | PRN

## 2017-12-05 MED ORDER — MORPHINE SULFATE (PF) 2 MG/ML IV SOLN: 2.0000 mg | INTRAVENOUS | Status: DC | PRN

## 2017-12-05 MED ORDER — 0.9 % SODIUM CHLORIDE (POUR BTL) OPTIME
TOPICAL | Status: DC | PRN
  Administered 2017-12-05: 2000 mL

## 2017-12-05 MED ORDER — ACETAMINOPHEN 325 MG RE SUPP: 325.0000 mg | RECTAL | Status: DC | PRN

## 2017-12-05 MED ORDER — DEXAMETHASONE SODIUM PHOSPHATE 10 MG/ML IJ SOLN
INTRAMUSCULAR | Status: DC | PRN
  Administered 2017-12-05: 8 mg via INTRAVENOUS

## 2017-12-05 MED ORDER — GUAIFENESIN-DM 100-10 MG/5ML PO SYRP: 15.0000 mL | ORAL_SOLUTION | ORAL | Status: DC | PRN

## 2017-12-05 MED ORDER — PHENYLEPHRINE 40 MCG/ML (10ML) SYRINGE FOR IV PUSH (FOR BLOOD PRESSURE SUPPORT)
PREFILLED_SYRINGE | INTRAVENOUS | Status: AC
  Filled 2017-12-05: qty 10

## 2017-12-05 MED ORDER — POTASSIUM CHLORIDE CRYS ER 20 MEQ PO TBCR: 20.0000 meq | EXTENDED_RELEASE_TABLET | Freq: Every day | ORAL | Status: DC | PRN

## 2017-12-05 MED ORDER — LIDOCAINE 2% (20 MG/ML) 5 ML SYRINGE
INTRAMUSCULAR | Status: DC | PRN
  Administered 2017-12-05: 60 mg via INTRAVENOUS

## 2017-12-05 MED ORDER — ALUM & MAG HYDROXIDE-SIMETH 200-200-20 MG/5ML PO SUSP: 15.0000 mL | ORAL | Status: DC | PRN

## 2017-12-05 MED ORDER — PROPOFOL 10 MG/ML IV BOLUS
INTRAVENOUS | Status: DC | PRN
  Administered 2017-12-05: 120 mg via INTRAVENOUS

## 2017-12-05 MED ORDER — OXYCODONE HCL 5 MG PO TABS: 5.0000 mg | ORAL_TABLET | ORAL | Status: DC | PRN

## 2017-12-05 MED ORDER — LACTATED RINGERS IV SOLN
INTRAVENOUS | Status: DC | PRN
  Administered 2017-12-05: 07:00:00 via INTRAVENOUS

## 2017-12-05 MED ORDER — DEXAMETHASONE SODIUM PHOSPHATE 10 MG/ML IJ SOLN
INTRAMUSCULAR | Status: AC
  Filled 2017-12-05: qty 1

## 2017-12-05 MED ORDER — SODIUM CHLORIDE 0.9 % IV SOLN: INTRAVENOUS | Status: DC

## 2017-12-05 MED ORDER — HEMOSTATIC AGENTS (NO CHARGE) OPTIME
TOPICAL | Status: DC | PRN
  Administered 2017-12-05: 1 via TOPICAL

## 2017-12-05 MED ORDER — ROCURONIUM BROMIDE 50 MG/5ML IV SOSY
PREFILLED_SYRINGE | INTRAVENOUS | Status: AC
  Filled 2017-12-05: qty 5

## 2017-12-05 MED ORDER — LIDOCAINE HCL 4 % MT SOLN
OROMUCOSAL | Status: DC | PRN
  Administered 2017-12-05: 4 mL via TOPICAL

## 2017-12-05 MED ORDER — ACETAMINOPHEN 325 MG PO TABS: 325.0000 mg | ORAL_TABLET | ORAL | Status: DC | PRN

## 2017-12-05 MED ORDER — SUGAMMADEX SODIUM 200 MG/2ML IV SOLN
INTRAVENOUS | Status: DC | PRN
  Administered 2017-12-05: 200 mg via INTRAVENOUS

## 2017-12-05 MED ORDER — SODIUM CHLORIDE 0.9 % IV SOLN
INTRAVENOUS | Status: AC
  Filled 2017-12-05: qty 1.2

## 2017-12-05 MED ORDER — PHENOL 1.4 % MT LIQD: 1.0000 | OROMUCOSAL | Status: DC | PRN

## 2017-12-05 MED ORDER — VANCOMYCIN HCL IN DEXTROSE 1-5 GM/200ML-% IV SOLN
1000.0000 mg | INTRAVENOUS | Status: AC
  Administered 2017-12-05: 1000 mg via INTRAVENOUS
  Filled 2017-12-05: qty 200

## 2017-12-05 SURGICAL SUPPLY — 54 items
ADH SKN CLS APL DERMABOND .7 (GAUZE/BANDAGES/DRESSINGS) ×1
CANISTER SUCT 3000ML PPV (MISCELLANEOUS) ×3 IMPLANT
CATH ROBINSON RED A/P 18FR (CATHETERS) ×3 IMPLANT
CATH SUCT 10FR WHISTLE TIP (CATHETERS) IMPLANT
CLIP VESOCCLUDE MED 6/CT (CLIP) ×3 IMPLANT
CLIP VESOCCLUDE SM WIDE 6/CT (CLIP) ×3 IMPLANT
CONT SPEC 4OZ CLIKSEAL STRL BL (MISCELLANEOUS) ×9 IMPLANT
CORD BIPOLAR FORCEPS 12FT (ELECTRODE) ×3 IMPLANT
COVER PROBE W GEL 5X96 (DRAPES) ×3 IMPLANT
CRADLE DONUT ADULT HEAD (MISCELLANEOUS) ×3 IMPLANT
DERMABOND ADVANCED (GAUZE/BANDAGES/DRESSINGS) ×2
DERMABOND ADVANCED .7 DNX12 (GAUZE/BANDAGES/DRESSINGS) ×1 IMPLANT
DRAIN CHANNEL 15F RND FF W/TCR (WOUND CARE) IMPLANT
ELECT REM PT RETURN 9FT ADLT (ELECTROSURGICAL) ×3
ELECTRODE REM PT RTRN 9FT ADLT (ELECTROSURGICAL) ×1 IMPLANT
EVACUATOR SILICONE 100CC (DRAIN) IMPLANT
FORCEPS BIPOLAR SPETZLER 8 1.0 (NEUROSURGERY SUPPLIES) IMPLANT
GLOVE BIO SURGEON STRL SZ7.5 (GLOVE) ×3 IMPLANT
GLOVE BIOGEL PI IND STRL 6.5 (GLOVE) ×1 IMPLANT
GLOVE BIOGEL PI IND STRL 7.0 (GLOVE) ×1 IMPLANT
GLOVE BIOGEL PI IND STRL 7.5 (GLOVE) ×1 IMPLANT
GLOVE BIOGEL PI INDICATOR 6.5 (GLOVE) ×2
GLOVE BIOGEL PI INDICATOR 7.0 (GLOVE) ×2
GLOVE BIOGEL PI INDICATOR 7.5 (GLOVE) ×2
GLOVE SS N UNI LF 6.5 STRL (GLOVE) ×3 IMPLANT
GLOVE SURG SS PI 7.5 STRL IVOR (GLOVE) ×3 IMPLANT
GOWN STRL REUS W/ TWL LRG LVL3 (GOWN DISPOSABLE) ×2 IMPLANT
GOWN STRL REUS W/ TWL XL LVL3 (GOWN DISPOSABLE) ×1 IMPLANT
GOWN STRL REUS W/TWL LRG LVL3 (GOWN DISPOSABLE) ×9
GOWN STRL REUS W/TWL XL LVL3 (GOWN DISPOSABLE) ×3
HEMOSTAT SNOW SURGICEL 2X4 (HEMOSTASIS) IMPLANT
HEMOSTAT SURGICEL 2X14 (HEMOSTASIS) ×3 IMPLANT
INSERT FOGARTY SM (MISCELLANEOUS) IMPLANT
KIT BASIN OR (CUSTOM PROCEDURE TRAY) ×3 IMPLANT
KIT SHUNT ARGYLE CAROTID ART 6 (VASCULAR PRODUCTS) IMPLANT
KIT TURNOVER KIT B (KITS) ×3 IMPLANT
NEEDLE 18GX1X1/2 (RX/OR ONLY) (NEEDLE) ×3 IMPLANT
NEEDLE HYPO 25GX1X1/2 BEV (NEEDLE) ×3 IMPLANT
NS IRRIG 1000ML POUR BTL (IV SOLUTION) ×9 IMPLANT
PACK CAROTID (CUSTOM PROCEDURE TRAY) ×3 IMPLANT
PAD ARMBOARD 7.5X6 YLW CONV (MISCELLANEOUS) ×6 IMPLANT
SHUNT CAROTID BYPASS 10 (VASCULAR PRODUCTS) IMPLANT
SHUNT CAROTID BYPASS 12FRX15.5 (VASCULAR PRODUCTS) IMPLANT
SUT ETHILON 3 0 PS 1 (SUTURE) IMPLANT
SUT PROLENE 6 0 BV (SUTURE) ×3 IMPLANT
SUT PROLENE 7 0 BV 1 (SUTURE) IMPLANT
SUT SILK 3 0 (SUTURE)
SUT SILK 3-0 18XBRD TIE 12 (SUTURE) IMPLANT
SUT VIC AB 3-0 SH 27 (SUTURE) ×6
SUT VIC AB 3-0 SH 27X BRD (SUTURE) ×2 IMPLANT
SUT VICRYL 4-0 PS2 18IN ABS (SUTURE) ×3 IMPLANT
SYR CONTROL 10ML LL (SYRINGE) ×2 IMPLANT
TOWEL GREEN STERILE (TOWEL DISPOSABLE) ×3 IMPLANT
WATER STERILE IRR 1000ML POUR (IV SOLUTION) ×3 IMPLANT

## 2017-12-05 NOTE — Transfer of Care (Signed)
Immediate Anesthesia Transfer of Care Note  Patient: Tracy Downs  Procedure(s) Performed: TUMOR EXCISION CAROTID BODY LEFT (Left Neck)  Patient Location: PACU  Anesthesia Type:General  Level of Consciousness: awake, alert  and oriented  Airway & Oxygen Therapy: Patient Spontanous Breathing and Patient connected to nasal cannula oxygen  Post-op Assessment: Report given to RN and Post -op Vital signs reviewed and stable  Post vital signs: Reviewed and stable  Last Vitals:  Vitals Value Taken Time  BP 133/98 12/05/2017  8:57 AM  Temp    Pulse 93 12/05/2017  9:00 AM  Resp 26 12/05/2017  9:00 AM  SpO2 100 % 12/05/2017  9:00 AM  Vitals shown include unvalidated device data.  Last Pain:  Vitals:   12/05/17 0617  TempSrc:   PainSc: 0-No pain         Complications: No apparent anesthesia complications

## 2017-12-05 NOTE — Anesthesia Postprocedure Evaluation (Signed)
Anesthesia Post Note  Patient: [employer]  Procedure(s) Performed: TUMOR EXCISION CAROTID BODY LEFT (Left Neck)     Patient location during evaluation: PACU Anesthesia Type: General Level of consciousness: awake and alert Pain management: pain level controlled Vital Signs Assessment: post-procedure vital signs reviewed and stable Respiratory status: spontaneous breathing, nonlabored ventilation, respiratory function stable and patient connected to nasal cannula oxygen Cardiovascular status: blood pressure returned to baseline and stable Postop Assessment: no apparent nausea or vomiting Anesthetic complications: no    Last Vitals:  Vitals:   12/05/17 1500 12/05/17 1954  BP:  126/88  Pulse:  (!) 108  Resp: 18 18  Temp:  36.8 C  SpO2:  97%    Last Pain:  Vitals:   12/05/17 1954  TempSrc: Oral  PainSc: 0-No pain                 Masey Scheiber COKER

## 2017-12-05 NOTE — Op Note (Signed)
NAME: Tracy Downs, Tracy Downs MEDICAL RECORD BO:[id number] ACCOUNT [id number] DATE OF BIRTH:[birth year] FACILITY: [facility] LOCATION: [facility] PHYSICIAN:Vinnie Gombert DRedmond Baseman, MD  OPERATIVE REPORT  DATE OF PROCEDURE:  12/05/2017  PREOPERATIVE DIAGNOSIS:  Left carotid body tumor.  POSTOPERATIVE DIAGNOSIS:  Left carotid body tumor.  PROCEDURE:  Excision of left carotid body tumor.  SURGEON:  Melida Quitter, M.D.  CO-SURGEON:  Dr. Napoleon Form.    ASSISTANTS:  None.  ANESTHESIA:  General endotracheal anesthesia.  COMPLICATIONS:  None.  INDICATIONS:  The patient is a 37 year old female who underwent excision of a larger right carotid body tumor about a year ago and at that time, we knew of a smaller left-sided tumor.  She presents now for excision of the left-sided tumor.  She underwent  a carotid angiogram yesterday, but the tumor did not require embolization due to the very small feeding vessels.  She presents to the operating room for surgical management.  FINDINGS:  A 1 x 1 cm mass was dissected free from the carotid bifurcation.  There were a couple of small lymph nodes superficial to that area that were also removed and sent for pathology.  DESCRIPTION OF PROCEDURE:  The patient was identified in the holding room, informed consent having been obtained including discussion of risks, benefits and alternatives, the patient was brought to the operative suite and put the operative table in  supine position.  Anesthesia was induced and the patient was intubated by the anesthesia team without difficulty.  The patient was given intravenous antibiotics during the case.  The eyes were taped closed and a shoulder roll was placed.  The left neck  incision was marked with a marking pen and injected with 1% lidocaine with 1:100,000 epinephrine.  The left neck was prepped and draped in sterile fashion.  Dr. Trula Slade performed a carotid ultrasound.  Incision was then made with a #10 blade scalpel and   extended through the subcutaneous tissue and platysma layer using Bovie electrocautery.  Subplatysmal flaps were elevated slightly superiorly and inferiorly and a self-retaining retractor was inserted.  The sternocleidomastoid muscle was then  skeletonized, exposing the great vessels.  The jugular vein was dissected including division of a couple of branches and ligation.  The vein was able to be retracted laterally and posteriorly exposing the carotid artery.  The fascia was dissected off of  the carotid artery to the bifurcation and then off of the internal and external branches.  The ansa cervicalis was retracted out of the way.  The tumor was then dissected free from the carotid bifurcation using Bovie electrocautery and careful dissection  and was fully excised and passed to nursing for pathology.  During dissection to the carotid, a few small lymph nodes were encountered and were removed and passed to nursing as a separate specimen.  After tumor removal, some bleeding was controlled with  Bovie electrocautery.  The wound was copiously irrigated with saline.  A piece of Surgicel fabric was laid down into the carotid bifurcation.  The retractor was removed and the platysmal layer was then closed with 3-0 Vicryl suture in a simple  interrupted fashion.  Subcutaneous layer was closed with 4-0 Vicryl suture in a simple interrupted fashion.  Skin was closed with Dermabond.  Drapes were removed and the patient was cleaned off.  She was returned to anesthesia for wakeup was extubated in  the recovery room in stable condition.  TN/NUANCE  D:12/05/2017 T:12/05/2017 JOB:[id number]

## 2017-12-05 NOTE — H&P (Signed)
   Patient name: Tracy Downs MRN: [id number] DOB: 09/21/1980 Sex: female  REASON FOR VISIT:     pre-op  HISTORY OF PRESENT ILLNESS:   Tracy Downs is a 37 y.o. female who is s/p carotid body tumor resection on the right, approximately 1 year ago.  She is now ready to have the left side done.  She had an angio yesterday, and there was nothing to embolize.  Her Castleman's remains stable  CURRENT MEDICATIONS:    Current Facility-Administered Medications  Medication Dose Route Frequency Provider Last Rate Last Dose  . 0.9 %  sodium chloride infusion   Intravenous Continuous Serafina Mitchell, MD      . chlorhexidine (HIBICLENS) 4 % liquid 4 application  60 mL Topical Once Serafina Mitchell, MD       And  . Derrill Memo ON 12/06/2017] chlorhexidine (HIBICLENS) 4 % liquid 4 application  60 mL Topical Once Serafina Mitchell, MD      . vancomycin (VANCOCIN) IVPB 1000 mg/200 mL premix  1,000 mg Intravenous 60 min Pre-Op Serafina Mitchell, MD       Facility-Administered Medications Ordered in Other Encounters  Medication Dose Route Frequency Provider Last Rate Last Dose  . lactated ringers infusion    Continuous PRN Cleda Daub, CRNA        REVIEW OF SYSTEMS:   [X]  denotes positive finding, [ ]  denotes negative finding Cardiac  Comments:  Chest pain or chest pressure:    Shortness of breath upon exertion:    Short of breath when lying flat:    Irregular heart rhythm:    Constitutional    Fever or chills:      PHYSICAL EXAM:   Vitals:   12/05/17 0544 12/05/17 0619  BP: (!) 122/104 106/84  Pulse: 93 77  Resp: 18   Temp: 97.8 F (36.6 C)   TempSrc: Oral   SpO2: 99%     GENERAL: The patient is a well-nourished female, in no acute distress. The vital signs are documented above. CARDIOVASCULAR: There is a regular rate and rhythm. PULMONARY: Non-labored respirations   STUDIES:   Negative angio yesterday   MEDICAL ISSUES:   Plan for  resection of left carotid body tumor.  Risks and benefits discussed.  All questions answered  Annamarie Major, MD [facility] [phone] Pager [phone]

## 2017-12-05 NOTE — Anesthesia Preprocedure Evaluation (Signed)
Anesthesia Evaluation  Patient identified by MRN, date of birth, ID band Patient awake    Reviewed: Allergy & Precautions, NPO status , Patient's Chart, lab work & pertinent test results  Airway Mallampati: II  TM Distance: >3 FB Neck ROM: Full    Dental  (+) Teeth Intact, Dental Advisory Given   Pulmonary    breath sounds clear to auscultation       Cardiovascular hypertension,  Rhythm:Regular Rate:Normal     Neuro/Psych    GI/Hepatic   Endo/Other    Renal/GU      Musculoskeletal   Abdominal   Peds  Hematology   Anesthesia Other Findings   Reproductive/Obstetrics                             Anesthesia Physical Anesthesia Plan  ASA: III  Anesthesia Plan: General   Post-op Pain Management:    Induction: Intravenous  PONV Risk Score and Plan: 1 and Ondansetron and Dexamethasone  Airway Management Planned:   Additional Equipment: Arterial line  Intra-op Plan:   Post-operative Plan: Extubation in OR  Informed Consent: I have reviewed the patients History and Physical, chart, labs and discussed the procedure including the risks, benefits and alternatives for the proposed anesthesia with the patient or authorized representative who has indicated his/her understanding and acceptance.     Plan Discussed with: CRNA and Anesthesiologist  Anesthesia Plan Comments:         Anesthesia Quick Evaluation

## 2017-12-05 NOTE — Op Note (Signed)
    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  She has a known small left-sided tumor.  She comes in today for excision.  She underwent angiography yesterday.  There are no vessels to embolize.  Procedure:  The patient was identified in the holding area and taken to [place] [facility]  The patient was then placed supine on the table. general anesthesia was administered.  The patient was prepped and draped in the usual sterile fashion.  A time out was called and antibiotics were administered.  Please see Dr. Redmond Baseman full operative note for details of the procedure.  I was present for and participated in all aspects of the procedure.   Disposition: To PACU stable   V. Annamarie Major, M.D. [facility] Office: [phone] Pager:  [phone]

## 2017-12-05 NOTE — Anesthesia Procedure Notes (Signed)
Procedure Name: Intubation Date/Time: 12/05/2017 7:49 AM Performed by: Marsa Aris, CRNA Pre-anesthesia Checklist: Patient identified, Emergency Drugs available, Suction available, Patient being monitored and Timeout performed Patient Re-evaluated:Patient Re-evaluated prior to induction Oxygen Delivery Method: Circle System Utilized Preoxygenation: Pre-oxygenation with 100% oxygen Induction Type: IV induction Ventilation: Mask ventilation without difficulty Laryngoscope Size: Miller and 2 Grade View: Grade I Tube type: Oral Tube size: 7.0 mm Number of attempts: 1 Airway Equipment and Method: Stylet and Bite block Placement Confirmation: ETT inserted through vocal cords under direct vision,  positive ETCO2 and breath sounds checked- equal and bilateral Secured at: 21 cm Tube secured with: Tape Dental Injury: Teeth and Oropharynx as per pre-operative assessment  Comments: Dentition same as pre-procedure

## 2017-12-05 NOTE — Brief Op Note (Signed)
12/05/2017  8:50 AM  PATIENT:  Tracy Downs  37 y.o. female  PRE-OPERATIVE DIAGNOSIS:  CAROTID BODY TUMOR  POST-OPERATIVE DIAGNOSIS:  Carotid Body Tumor   PROCEDURE:  Procedure(s): TUMOR EXCISION CAROTID BODY LEFT (Left)  SURGEON:  Surgeon(s) and Role:    * Serafina Mitchell, MD - Primary    * Melida Quitter, MD - Assisting  PHYSICIAN ASSISTANT:   ASSISTANTS: none   ANESTHESIA:   general  EBL:  10 mL   BLOOD ADMINISTERED:none  DRAINS: none   LOCAL MEDICATIONS USED:  LIDOCAINE   SPECIMEN:  Source of Specimen:  left carotid body tumor, left zone 3 nodes  DISPOSITION OF SPECIMEN:  PATHOLOGY  COUNTS:  YES  TOURNIQUET:  * No tourniquets in log *  DICTATION: .Other Dictation: Dictation Number [id number]  PLAN OF CARE: Admit for overnight observation  PATIENT DISPOSITION:  PACU - hemodynamically stable.   Delay start of Pharmacological VTE agent (>24hrs) due to surgical blood loss or risk of bleeding: no

## 2017-12-05 NOTE — Anesthesia Procedure Notes (Signed)
Arterial Line Insertion Start/End8/22/2019 7:14 AM Performed by: Roberts Gaudy, MD, Verdie Drown, CRNA, CRNA  Patient location: Pre-op. Preanesthetic checklist: patient identified, IV checked, risks and benefits discussed, surgical consent, monitors and equipment checked, pre-op evaluation, timeout performed and anesthesia consent Lidocaine 1% used for infiltration Left, radial was placed Catheter size: 20 G Hand hygiene performed  and maximum sterile barriers used   Attempts: 1 Procedure performed without using ultrasound guided technique. Following insertion, dressing applied and Biopatch. Post procedure assessment: normal  Patient tolerated the procedure well with no immediate complications.

## 2017-12-05 NOTE — Progress Notes (Signed)
   Subjective:    Patient ID: Tracy Downs, female    DOB: 29-Oct-1980, 37 y.o.   MRN: [id number]  HPI Doing well, feeling good.  Not much pain.  Review of Systems     Objective:   Physical Exam AF VSS Alert, NAD Left neck incision clean and intact.  No fluid collection. CN VII, XI and XII normal.     Assessment & Plan:  S/p left carotid body tumor resection.  Doing great.  Discharge likely tomorrow per Dr. Stephens Shire assessment.

## 2017-12-06 ENCOUNTER — Encounter (HOSPITAL_COMMUNITY): Payer: Self-pay | Admitting: Surgery

## 2017-12-06 ENCOUNTER — Other Ambulatory Visit: Payer: Self-pay

## 2017-12-06 ENCOUNTER — Encounter (HOSPITAL_COMMUNITY): Payer: Self-pay | Admitting: Anesthesiology

## 2017-12-06 ENCOUNTER — Telehealth: Payer: Self-pay | Admitting: Surgery

## 2017-12-06 LAB — BASIC METABOLIC PANEL
ANION GAP: 6 (ref 5–15)
BUN: 9 mg/dL (ref 6–20)
CO2: 24 mmol/L (ref 22–32)
Calcium: 8.7 mg/dL — ABNORMAL LOW (ref 8.9–10.3)
Chloride: 106 mmol/L (ref 98–111)
Creatinine, Ser: 0.75 mg/dL (ref 0.44–1.00)
Glucose, Bld: 106 mg/dL — ABNORMAL HIGH (ref 70–99)
POTASSIUM: 4 mmol/L (ref 3.5–5.1)
SODIUM: 136 mmol/L (ref 135–145)

## 2017-12-06 LAB — CBC
HCT: 45.1 % (ref 36.0–46.0)
Hemoglobin: 14.1 g/dL (ref 12.0–15.0)
MCH: 29.3 pg (ref 26.0–34.0)
MCHC: 31.3 g/dL (ref 30.0–36.0)
MCV: 93.8 fL (ref 78.0–100.0)
PLATELETS: 262 10*3/uL (ref 150–400)
RBC: 4.81 MIL/uL (ref 3.87–5.11)
RDW: 13.3 % (ref 11.5–15.5)
WBC: 13.8 10*3/uL — AB (ref 4.0–10.5)

## 2017-12-06 MED ORDER — TRAMADOL HCL 50 MG PO TABS: 50.0000 mg | ORAL_TABLET | Freq: Four times a day (QID) | ORAL | 0 refills | Status: DC | PRN

## 2017-12-06 NOTE — Discharge Summary (Signed)
Physician Discharge Summary   Patient ID: Tracy Downs [id number] 37 y.o. 09-24-1980  Admit date: 12/05/2017  Discharge date and time: 12/06/2017  8:52 AM   Admitting Physician: Serafina Mitchell, MD   Discharge Physician: same  Admission Diagnoses: CAROTID BODY TUMOR  Discharge Diagnoses: same  Admission Condition: fair  Discharged Condition: good  Indication for Admission: carotid body tumor  Hospital Course: Tracy Downs is a 37 year old female who was brought in as an outpatient for carotid body tumor resection on 12/05/2017 by Dr. Trula Slade and Dr. Redmond Baseman.  She tolerated this procedure well and was admitted to the hospital postoperatively.  POD #1 patient is ambulating without difficulty, tolerating a home diet, and feeling fit for discharge home.  No sign of bleeding or neck hematoma and no neurological events overnight.  She will follow-up with Dr. Trula Slade in the office in 2 weeks.  She will be prescribed 1 to 2 days of tramadol for continued postoperative pain control.  Discharge instructions were reviewed with the patient and she voices her understanding.  She will be discharged this morning to home in stable condition.  Consults: ENT  Treatments: surgery: L carotid body tumor resection by Dr. Trula Slade and Dr. Redmond Baseman 12/05/17  Discharge Exam: see progress note 12/06/17 Vitals:   12/05/17 2340 12/06/17 0300  BP: 122/82 123/87  Pulse: (!) 104 (!) 117  Resp: (!) 26 (!) 25  Temp: 98.3 F (36.8 C) 98 F (36.7 C)  SpO2: 98% 97%     Disposition: Discharge disposition: 01-Home or Self Care       Patient Instructions:  Allergies as of 12/06/2017      Reactions   Amoxicillin Rash   Childhood allergy  PATIENT HAS HAD A PCN REACTION WITH IMMEDIATE RASH, FACIAL/TONGUE/THROAT SWELLING, SOB, OR LIGHTHEADEDNESS WITH HYPOTENSION:  #  #  YES  #  #  Has patient had a PCN reaction causing severe rash involving mucus membranes or skin necrosis: Unknown Has patient had a PCN  reaction that required hospitalization: No Has patient had a PCN reaction occurring within the last 10 years: No If all of the above answers are "NO", then may proceed with Cephalosporin use.   Hydrocodone Other (See Comments)   Urinary retention   Penicillins Rash   Childhood allergy  PATIENT HAS HAD A PCN REACTION WITH IMMEDIATE RASH, FACIAL/TONGUE/THROAT SWELLING, SOB, OR LIGHTHEADEDNESS WITH HYPOTENSION:  #  #  YES  #  #  Has patient had a PCN reaction causing severe rash involving mucus membranes or skin necrosis: Unknown Has patient had a PCN reaction that required hospitalization: No Has patient had a PCN reaction occurring within the last 10 years: No If all of the above answers are "NO", then may proceed with Cephalosporin use.      Medication List    TAKE these medications   CLEAR EYES OP Place 2 drops into both eyes daily.   loratadine 10 MG tablet Commonly known as:  CLARITIN Take 10 mg by mouth daily as needed for allergies.   multivitamin with minerals Tabs tablet Take 1 tablet by mouth daily.   traMADol 50 MG tablet Commonly known as:  ULTRAM Take 1 tablet (50 mg total) by mouth every 6 (six) hours as needed for up to 12 doses.      Activity: activity as tolerated Diet: regular diet Wound Care: keep wound clean and dry  Follow-up with Dr. Trula Slade in 2 weeks.  SignedDagoberto Ligas 12/06/2017 10:27 AM

## 2017-12-06 NOTE — Anesthesia Preprocedure Evaluation (Deleted)

## 2017-12-06 NOTE — Addendum Note (Signed)
Addendum  created 12/06/17 0458 by Duane Boston, MD   Sign clinical note

## 2017-12-06 NOTE — Progress Notes (Signed)
    Subjective  - POD #1, s/p left carotid body tumor resection   No complaints   Physical Exam:  Neck soft groin soft Neuro intact No hematoma       Assessment/Plan:  POD #1  D/c home today F/u 2 weeks  Tracy Downs 12/06/2017 7:52 AM --  Vitals:   12/05/17 2340 12/06/17 0300  BP: 122/82 123/87  Pulse: (!) 104 (!) 117  Resp: (!) 26 (!) 25  Temp: 98.3 F (36.8 C) 98 F (36.7 C)  SpO2: 98% 97%    Intake/Output Summary (Last 24 hours) at 12/06/2017 0752 Last data filed at 12/05/2017 1956 Gross per 24 hour  Intake 1140 ml  Output 10 ml  Net 1130 ml     Laboratory CBC    Component Value Date/Time   WBC 13.8 (H) 12/06/2017 0300   HGB 14.1 12/06/2017 0300   HGB 12.8 06/05/2016 0920   HCT 45.1 12/06/2017 0300   HCT 40.9 06/05/2016 0920   PLT 262 12/06/2017 0300   PLT 332 06/05/2016 0920    BMET    Component Value Date/Time   NA 136 12/06/2017 0300   NA 139 06/05/2016 0920   K 4.0 12/06/2017 0300   K 4.5 06/05/2016 0920   CL 106 12/06/2017 0300   CO2 24 12/06/2017 0300   CO2 25 06/05/2016 0920   GLUCOSE 106 (H) 12/06/2017 0300   GLUCOSE 96 06/05/2016 0920   BUN 9 12/06/2017 0300   BUN 11.5 06/05/2016 0920   CREATININE 0.75 12/06/2017 0300   CREATININE 0.94 07/01/2017 1550   CREATININE 0.8 06/05/2016 0920   CALCIUM 8.7 (L) 12/06/2017 0300   CALCIUM 9.7 06/05/2016 0920   GFRNONAA >60 12/06/2017 0300   GFRNONAA >60 07/01/2017 1550   GFRAA >60 12/06/2017 0300   GFRAA >60 07/01/2017 1550    COAG Lab Results  Component Value Date   INR 0.96 11/25/2017   INR 0.94 11/05/2016   INR 0.96 04/30/2013   No results found for: PTT  Antibiotics Anti-infectives (From admission, onward)   Start     Dose/Rate Route Frequency Ordered Stop   12/05/17 0604  vancomycin (VANCOCIN) IVPB 1000 mg/200 mL premix     1,000 mg 200 mL/hr over 60 Minutes Intravenous 60 min pre-op 12/05/17 0604 12/05/17 0855       V. Leia Alf, M.D. [facility] Office: [phone] Pager:  [phone]

## 2017-12-06 NOTE — Telephone Encounter (Signed)
sch appt lvm 12/23/17 330pm P/o MD

## 2017-12-19 ENCOUNTER — Inpatient Hospital Stay: Payer: BLUE CROSS/BLUE SHIELD | Attending: Nurse Practitioner | Admitting: Nurse Practitioner

## 2017-12-19 ENCOUNTER — Telehealth: Payer: Self-pay

## 2017-12-19 ENCOUNTER — Encounter: Payer: Self-pay | Admitting: Nurse Practitioner

## 2017-12-19 VITALS — BP 137/88 | HR 100 | Temp 98.4°F | Resp 18 | Ht 59.0 in | Wt 167.4 lb

## 2017-12-19 DIAGNOSIS — D47Z2 Castleman disease: Secondary | ICD-10-CM | POA: Insufficient documentation

## 2017-12-19 DIAGNOSIS — D446 Neoplasm of uncertain behavior of carotid body: Secondary | ICD-10-CM | POA: Diagnosis not present

## 2017-12-19 NOTE — Progress Notes (Signed)
[facility] OFFICE PROGRESS NOTE   Diagnosis: Castleman's disease, paraganglioma  INTERVAL HISTORY:   Ms. Limb returns as scheduled.  She underwent excision of a left carotid body tumor 12/05/2017.  Pathology showed paraganglioma, one benign lymph node.  She overall is feeling well.  No shortness of breath.  No chest discomfort.  No dysphagia.  She denies pain.  No fevers or sweats.  Objective:  Vital signs in last 24 hours:  Blood pressure 137/88, pulse 100, temperature 98.4 F (36.9 C), temperature source Oral, resp. rate 18, height 4\' 11"  (1.499 m), weight 167 lb 6.4 oz (75.9 kg), last menstrual period 12/04/2017, SpO2 100 %.    HEENT: Neck without mass.  Left neck surgical incision without erythema. Lymphatics: No palpable cervical, supraclavicular or axillary lymph nodes. Resp: Lungs clear bilaterally. Cardio: Regular rate and rhythm. GI: Abdomen soft and nontender.  No hepatosplenomegaly. Vascular: No leg edema.  Lab Results:  Lab Results  Component Value Date   WBC 13.8 (H) 12/06/2017   HGB 14.1 12/06/2017   HCT 45.1 12/06/2017   MCV 93.8 12/06/2017   PLT 262 12/06/2017   NEUTROABS 5.5 11/25/2017    Imaging:  No results found.  Medications: I have reviewed the patient's current medications.  Assessment/Plan: 1. Castleman's disease. A CT of the chest 06/09/2009 confirmed a right paratracheal mass. A mediastinoscopy 06/21/2009 with biopsy of the mediastinal mass confirmed Castleman's disease. Staging CTs of the chest, abdomen and pelvis 08/12/2009 confirmed a right paratracheal mass, a superior mediastinal lymph node and a left cervical/supraclavicular lymph node. There was no evidence of Castleman's disease in the abdomen or pelvis. A CT on 11/07/2009 was stable.  Restaging CTs 07/10/2013 confirmed worsening of a single level 2 right cervical lymph node compared to a CT from 11/07/2009. No significant change in paratracheal  lymphadenopathy.  Chest x-ray 02/24/2014-stable right paratracheal soft tissue mass  Chest x-ray 08/23/2014-stable right paratracheal soft tissue mass  CT 11/29/2014 with a slight increase in the right cervical lymph node compared to 07/10/2013 and a stable right paratracheal node  CT chest 07/11/2016 with interval stability of hypervascular right paratracheal lymphadenopathy. No new or progressive disease in the chest.  CT neck 07/11/2016-enlarging heterogeneous hyperdense mass lesion within the right carotid space. Approximate 10 mm bilateral level II hyperdense lymph nodes stable.  CT chest 05/29/2017-no change in the right paratracheal mass, no evidence of progressive disease 2. G2 P2, status post delivery of her second child in January 2015 3. Bilateral salpingectomy 06/05/2013 4. Right anterior cervical lymph node on exam 07/03/2013--stable. 5. CT neck 07/11/2016-enlarging heterogeneous hyperdense mass lesion within the right carotid space. Approximate 10 mm bilateral level II hyperdense lymph nodes stable. CTangioneck 08/31/2016-large 3.9 cm hypervascular mass within the right carotid space;small but very similar contralateral left carotid space hypervascular lesion measuring 1.2 cm.  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   Disposition: Ms. Sharol Roussel appears stable.  She remains asymptomatic from the Castleman's disease.  There is no clinical evidence of disease progression.  She will undergo a restaging CT scan of the chest in 6 months.  We will see her in follow-up a few days later to review the results.  She will contact the office prior to her next visit with any problems.  We specifically discussed shortness of breath, chest discomfort.  Plan reviewed  with Dr.  Benay Spice.    [name] ANP/GNP-BC   12/19/2017  12:45 PM

## 2017-12-19 NOTE — Telephone Encounter (Signed)
Printed avs and calender of upcoming appointment. Per 9/5 los 

## 2017-12-23 ENCOUNTER — Other Ambulatory Visit: Payer: Self-pay

## 2017-12-23 ENCOUNTER — Ambulatory Visit (INDEPENDENT_AMBULATORY_CARE_PROVIDER_SITE_OTHER): Payer: BLUE CROSS/BLUE SHIELD | Admitting: Surgery

## 2017-12-23 ENCOUNTER — Encounter: Payer: Self-pay | Admitting: Surgery

## 2017-12-23 VITALS — BP 132/90 | HR 93 | Temp 98.4°F | Resp 16 | Ht 59.0 in | Wt 164.0 lb

## 2017-12-23 DIAGNOSIS — D446 Neoplasm of uncertain behavior of carotid body: Secondary | ICD-10-CM

## 2017-12-23 NOTE — Progress Notes (Signed)
   Patient name: Tracy Downs MRN: [id number] DOB: 12/10/1980 Sex: female  REASON FOR VISIT:     post op  HISTORY OF PRESENT ILLNESS:   Tracy Downs is a 37 y.o. female returns today for her first postoperative visit.  On 12/05/2017 she underwent resection of a left carotid body tumor.  Her postoperative course was uncomplicated.  She went home on postoperative day #1.  She has no complaints today.  CURRENT MEDICATIONS:    Current Outpatient Medications  Medication Sig Dispense Refill  . Multiple Vitamin (MULTIVITAMIN WITH MINERALS) TABS tablet Take 1 tablet by mouth daily.    . Naphazoline HCl (CLEAR EYES OP) Place 2 drops into both eyes daily.     Marland Kitchen loratadine (CLARITIN) 10 MG tablet Take 10 mg by mouth daily as needed for allergies.     No current facility-administered medications for this visit.     REVIEW OF SYSTEMS:   [X]  denotes positive finding, [ ]  denotes negative finding Cardiac  Comments:  Chest pain or chest pressure:    Shortness of breath upon exertion:    Short of breath when lying flat:    Irregular heart rhythm:    Constitutional    Fever or chills:      PHYSICAL EXAM:   Vitals:   12/23/17 1541 12/23/17 1544  BP: 111/76 132/90  Pulse: 94 93  Resp: 16   Temp: 98.4 F (36.9 C)   TempSrc: Oral   SpO2: 100%   Weight: 164 lb (74.4 kg)   Height: 4\' 11"  (1.499 m)     GENERAL: The patient is a well-nourished female, in no acute distress. The vital signs are documented above. CARDIOVASCULAR: There is a regular rate and rhythm. PULMONARY: Non-labored respirations Incision is well-healed.  No facial droop No neurologic deficits  STUDIES:   Pathology was consistent with benign lymph nodes and paraganglioma   MEDICAL ISSUES:   Status post resection of left carotid body tumor.  She has no current issues.  I reviewed the pathology with her which was benign.  No further follow-up is recommended.  She will contact  me with any future concerns.  Annamarie Major, MD [facility] [phone] Pager [phone]

## 2018-03-07 ENCOUNTER — Other Ambulatory Visit: Payer: Self-pay | Admitting: Obstetrics and Gynecology

## 2018-03-07 DIAGNOSIS — Z1231 Encounter for screening mammogram for malignant neoplasm of breast: Secondary | ICD-10-CM

## 2018-03-12 ENCOUNTER — Inpatient Hospital Stay: Payer: BLUE CROSS/BLUE SHIELD | Attending: Nurse Practitioner | Admitting: Oncology

## 2018-03-12 ENCOUNTER — Ambulatory Visit (HOSPITAL_COMMUNITY)
Admission: RE | Admit: 2018-03-12 | Discharge: 2018-03-12 | Disposition: A | Discharge status: Home / Self Care | Payer: BLUE CROSS/BLUE SHIELD | Source: Ambulatory Visit | Attending: Oncology | Admitting: Oncology

## 2018-03-12 ENCOUNTER — Telehealth: Payer: Self-pay | Admitting: Oncology

## 2018-03-12 ENCOUNTER — Telehealth: Payer: Self-pay | Admitting: Nurse Practitioner

## 2018-03-12 ENCOUNTER — Telehealth: Payer: Self-pay | Admitting: *Deleted

## 2018-03-12 VITALS — BP 120/82 | HR 110 | Temp 98.4°F | Resp 18 | Ht 59.0 in | Wt 164.1 lb

## 2018-03-12 DIAGNOSIS — D47Z2 Castleman disease: Secondary | ICD-10-CM | POA: Insufficient documentation

## 2018-03-12 DIAGNOSIS — D446 Neoplasm of uncertain behavior of carotid body: Secondary | ICD-10-CM | POA: Diagnosis not present

## 2018-03-12 DIAGNOSIS — R42 Dizziness and giddiness: Secondary | ICD-10-CM | POA: Insufficient documentation

## 2018-03-12 MED ORDER — IOPAMIDOL (ISOVUE-370) INJECTION 76%
INTRAVENOUS | Status: AC
  Filled 2018-03-12: qty 100

## 2018-03-12 MED ORDER — SODIUM CHLORIDE (PF) 0.9 % IJ SOLN
INTRAMUSCULAR | Status: AC
  Filled 2018-03-12: qty 50

## 2018-03-12 MED ORDER — IOPAMIDOL (ISOVUE-370) INJECTION 76%
100.0000 mL | Freq: Once | INTRAVENOUS | Status: AC | PRN
  Administered 2018-03-12: 100 mL via INTRAVENOUS

## 2018-03-12 NOTE — Telephone Encounter (Signed)
Called to report worsening chest pain/tightness over the past 2 weeks. Has also felt lightheaded. Pain is intermittent, but is daily. Went to urgent care last week and EKG was normal. She denies any shortness of breath. Asking to be seen. Dr. Benay Spice agrees to see her today at 1pm.

## 2018-03-12 NOTE — Telephone Encounter (Signed)
Tracy Downs was notified that the CT scan did not provide an explanation for her symptoms.  She will follow-up with her PCP.

## 2018-03-12 NOTE — Telephone Encounter (Signed)
Scheduled appt per 11/27 los - pt is aware of appt date and time

## 2018-03-12 NOTE — Progress Notes (Signed)
[facility] OFFICE PROGRESS NOTE   Diagnosis: Castleman's disease  INTERVAL HISTORY:   Ms. Lecount returns prior to his scheduled visit.  She complains of "anterior chest tightness and discomfort "for the past 2 weeks.  No fever, dyspnea, sweats, nausea, or radiation of pain.  No dysphasia.  The pain occurs intermittently and last for minutes..  No pleuritic pain.  She was evaluated at an urgent care on 03/05/2018.  An EKG revealed normal sinus rhythm.  The TSH returned normal.  The symptoms occur at rest and with exertion.  No leg swelling or pain.  She also reports an intermittent feeling of "lightheadedness ".  No syncope event.   Objective:  Vital signs in last 24 hours:  Blood pressure 120/82, pulse (!) 110, temperature 98.4 F (36.9 C), temperature source Oral, resp. rate 18, height 4\' 11"  (1.499 m), weight 164 lb 1.6 oz (74.4 kg), SpO2 100 %.    HEENT: Neck without mass Lymphatics: No cervical, supraclavicular, axillary, or inguinal nodes Resp: Lungs clear bilaterally in the anterior and posterior lung fields, no respiratory distress Cardio: Regular rate and rhythm, the neck veins are not distended GI: Nontender, no hepatosplenomegaly Vascular: No leg edema  Lab Results:  Lab Results  Component Value Date   WBC 13.8 (H) 12/06/2017   HGB 14.1 12/06/2017   HCT 45.1 12/06/2017   MCV 93.8 12/06/2017   PLT 262 12/06/2017   NEUTROABS 5.5 11/25/2017    CMP  Lab Results  Component Value Date   NA 136 12/06/2017   K 4.0 12/06/2017   CL 106 12/06/2017   CO2 24 12/06/2017   GLUCOSE 106 (H) 12/06/2017   BUN 9 12/06/2017   CREATININE 0.75 12/06/2017   CALCIUM 8.7 (L) 12/06/2017   PROT 7.3 11/25/2017   ALBUMIN 4.3 11/25/2017   AST 35 11/25/2017   ALT 38 11/25/2017   ALKPHOS 49 11/25/2017   BILITOT 0.8 11/25/2017   GFRNONAA >60 12/06/2017   GFRAA >60 12/06/2017     Medications: I have reviewed the patient's current  medications.   Assessment/Plan: 1. Castleman's disease. A CT of the chest 06/09/2009 confirmed a right paratracheal mass. A mediastinoscopy 06/21/2009 with biopsy of the mediastinal mass confirmed Castleman's disease. Staging CTs of the chest, abdomen and pelvis 08/12/2009 confirmed a right paratracheal mass, a superior mediastinal lymph node and a left cervical/supraclavicular lymph node. There was no evidence of Castleman's disease in the abdomen or pelvis. A CT on 11/07/2009 was stable.  Restaging CTs 07/10/2013 confirmed worsening of a single level 2 right cervical lymph node compared to a CT from 11/07/2009. No significant change in paratracheal lymphadenopathy.  Chest x-ray 02/24/2014-stable right paratracheal soft tissue mass  Chest x-ray 08/23/2014-stable right paratracheal soft tissue mass  CT 11/29/2014 with a slight increase in the right cervical lymph node compared to 07/10/2013 and a stable right paratracheal node  CT chest 07/11/2016 with interval stability of hypervascular right paratracheal lymphadenopathy. No new or progressive disease in the chest.  CT neck 07/11/2016-enlarging heterogeneous hyperdense mass lesion within the right carotid space. Approximate 10 mm bilateral level II hyperdense lymph nodes stable.  CT chest 05/29/2017-no change in the right paratracheal mass, no evidence of progressive disease  CT angiogram chest 07/01/2017-no pulmonary embolism, right paratracheal mass-minimally larger, measures 3.5 x 4.1 compared to 3.1 x 3.8 on 05/29/2017 2. G2 P2, status post delivery of her second child in January 2015 3. Bilateral salpingectomy 06/05/2013 4. Right anterior cervical lymph node on exam 07/03/2013--stable. 5.  CT neck 07/11/2016-enlarging heterogeneous hyperdense mass lesion within the right carotid space. Approximate 10 mm bilateral level II hyperdense lymph nodes stable. CTangioneck 08/31/2016-large 3.9 cm hypervascular mass within the right carotid  space;small but very similar contralateral left carotid space hypervascular lesion measuring 1.2 cm.  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     Disposition: Ms. Detter has a history of Castleman's disease.  She also underwent resection of bilateral carotid body tumors.  She presents today with chest discomfort and dizziness for the past few weeks.  The etiology of her symptoms is unclear.  I have a low clinical suspicion for an acute cardiopulmonary process.  She has a history of Castleman's disease and paragangliomas with the potential for progressive disease in the chest and an increased risk of thromboembolic disease.  I will refer her for a CT chest to rule out disease progression, pulmonary embolism, or another acute cardiopulmonary process.  We will schedule the CT for today.  We will refer her to Dr. Lavone Neri if the CT is negative and her symptoms persist.  Betsy Coder, MD  03/12/2018  2:24 PM

## 2018-03-17 ENCOUNTER — Other Ambulatory Visit (HOSPITAL_COMMUNITY)
Admission: RE | Admit: 2018-03-17 | Discharge: 2018-03-17 | Disposition: A | Discharge status: Home / Self Care | Payer: BLUE CROSS/BLUE SHIELD | Source: Ambulatory Visit | Attending: Obstetrics and Gynecology | Admitting: Obstetrics and Gynecology

## 2018-03-17 ENCOUNTER — Other Ambulatory Visit: Payer: Self-pay | Admitting: Obstetrics and Gynecology

## 2018-03-17 DIAGNOSIS — Z01411 Encounter for gynecological examination (general) (routine) with abnormal findings: Secondary | ICD-10-CM | POA: Diagnosis present

## 2018-03-19 ENCOUNTER — Telehealth: Payer: Self-pay | Admitting: Emergency Medicine

## 2018-03-19 NOTE — Telephone Encounter (Signed)
Patient would like to cancel appt for 12/5. States she feels much better and does not need to be seen.

## 2018-03-20 ENCOUNTER — Ambulatory Visit: Payer: BLUE CROSS/BLUE SHIELD | Admitting: Nurse Practitioner

## 2018-03-20 LAB — CYTOLOGY - PAP
Diagnosis: NEGATIVE
HPV: NOT DETECTED

## 2018-04-21 ENCOUNTER — Ambulatory Visit
Admission: RE | Admit: 2018-04-21 | Discharge: 2018-04-21 | Disposition: A | Discharge status: Home / Self Care | Payer: BLUE CROSS/BLUE SHIELD | Source: Ambulatory Visit | Attending: Obstetrics and Gynecology | Admitting: Obstetrics and Gynecology

## 2018-04-21 DIAGNOSIS — Z1231 Encounter for screening mammogram for malignant neoplasm of breast: Secondary | ICD-10-CM

## 2018-06-17 ENCOUNTER — Inpatient Hospital Stay: Payer: BLUE CROSS/BLUE SHIELD | Attending: Nurse Practitioner

## 2018-06-17 DIAGNOSIS — R222 Localized swelling, mass and lump, trunk: Secondary | ICD-10-CM | POA: Insufficient documentation

## 2018-06-17 DIAGNOSIS — Z86711 Personal history of pulmonary embolism: Secondary | ICD-10-CM | POA: Insufficient documentation

## 2018-06-17 DIAGNOSIS — R0789 Other chest pain: Secondary | ICD-10-CM | POA: Diagnosis not present

## 2018-06-17 DIAGNOSIS — D47Z2 Castleman disease: Secondary | ICD-10-CM | POA: Diagnosis not present

## 2018-06-17 LAB — BASIC METABOLIC PANEL - CANCER CENTER ONLY
Anion gap: 10 (ref 5–15)
BUN: 14 mg/dL (ref 6–20)
CO2: 27 mmol/L (ref 22–32)
Calcium: 8.8 mg/dL — ABNORMAL LOW (ref 8.9–10.3)
Chloride: 104 mmol/L (ref 98–111)
Creatinine: 0.87 mg/dL (ref 0.44–1.00)
GFR, Est AFR Am: >60 mL/min (ref >60)
GFR, Estimated: >60 mL/min (ref >60)
Glucose, Bld: 73 mg/dL (ref 70–99)
Potassium: 4.4 mmol/L (ref 3.5–5.1)
Sodium: 141 mmol/L (ref 135–145)

## 2018-06-19 ENCOUNTER — Other Ambulatory Visit: Payer: Self-pay

## 2018-06-19 ENCOUNTER — Inpatient Hospital Stay (HOSPITAL_BASED_OUTPATIENT_CLINIC_OR_DEPARTMENT_OTHER): Payer: BLUE CROSS/BLUE SHIELD | Admitting: Oncology

## 2018-06-19 ENCOUNTER — Ambulatory Visit (HOSPITAL_COMMUNITY)
Admission: RE | Admit: 2018-06-19 | Discharge: 2018-06-19 | Disposition: A | Discharge status: Home / Self Care | Payer: BLUE CROSS/BLUE SHIELD | Source: Ambulatory Visit | Attending: Nurse Practitioner | Admitting: Nurse Practitioner

## 2018-06-19 ENCOUNTER — Telehealth: Payer: Self-pay | Admitting: Oncology

## 2018-06-19 VITALS — BP 129/87 | HR 86 | Temp 98.6°F | Resp 18 | Ht 59.0 in | Wt 162.5 lb

## 2018-06-19 DIAGNOSIS — D47Z2 Castleman disease: Secondary | ICD-10-CM | POA: Insufficient documentation

## 2018-06-19 DIAGNOSIS — Z86711 Personal history of pulmonary embolism: Secondary | ICD-10-CM | POA: Diagnosis not present

## 2018-06-19 DIAGNOSIS — R0789 Other chest pain: Secondary | ICD-10-CM

## 2018-06-19 DIAGNOSIS — R222 Localized swelling, mass and lump, trunk: Secondary | ICD-10-CM | POA: Diagnosis not present

## 2018-06-19 MED ORDER — IOHEXOL 300 MG/ML  SOLN
75.0000 mL | Freq: Once | INTRAMUSCULAR | Status: AC | PRN
  Administered 2018-06-19: 75 mL via INTRAVENOUS

## 2018-06-19 MED ORDER — SODIUM CHLORIDE (PF) 0.9 % IJ SOLN
INTRAMUSCULAR | Status: AC
  Filled 2018-06-19: qty 50

## 2018-06-19 NOTE — Telephone Encounter (Signed)
Scheduled appt per 3/5 los. ° °Printed calendar and avs. °

## 2018-06-19 NOTE — Progress Notes (Signed)
[facility] OFFICE PROGRESS NOTE   Diagnosis: Castleman's disease  INTERVAL HISTORY:   [name] returns as scheduled.  She generally feels well.  Good appetite.  No fever.  No palpable lymph nodes.  She continues to have intermittent episodes of chest "tightness ".  This occurs in various parts of the chest and resolves after a few minutes.  No associated symptoms.  Objective:  Vital signs in last 24 hours:  Blood pressure 129/87, pulse 86, temperature 98.6 F (37 C), temperature source Oral, resp. rate 18, height 4\' 11"  (1.499 m), weight 162 lb 8 oz (73.7 kg), last menstrual period 06/09/2018, SpO2 100 %.    HEENT: Neck without mass Lymphatics: No cervical, supraclavicular, axillary, or inguinal nodes Resp: Lungs clear bilaterally Cardio: Regular rate and rhythm GI: No hepatosplenomegaly, no mass Vascular: No leg edema   Lab Results:  Lab Results  Component Value Date   WBC 13.8 (H) 12/06/2017   HGB 14.1 12/06/2017   HCT 45.1 12/06/2017   MCV 93.8 12/06/2017   PLT 262 12/06/2017   NEUTROABS 5.5 11/25/2017    CMP  Lab Results  Component Value Date   NA 141 06/17/2018   K 4.4 06/17/2018   CL 104 06/17/2018   CO2 27 06/17/2018   GLUCOSE 73 06/17/2018   BUN 14 06/17/2018   CREATININE 0.87 06/17/2018   CALCIUM 8.8 (L) 06/17/2018   PROT 7.3 11/25/2017   ALBUMIN 4.3 11/25/2017   AST 35 11/25/2017   ALT 38 11/25/2017   ALKPHOS 49 11/25/2017   BILITOT 0.8 11/25/2017   GFRNONAA >60 06/17/2018   GFRAA >60 06/17/2018     Medications: I have reviewed the patient's current medications.   Assessment/Plan: 1. Castleman's disease. A CT of the chest 06/09/2009 confirmed a right paratracheal mass. A mediastinoscopy 06/21/2009 with biopsy of the mediastinal mass confirmed Castleman's disease. Staging CTs of the chest, abdomen and pelvis 08/12/2009 confirmed a right paratracheal mass, a superior mediastinal lymph node and a left cervical/supraclavicular  lymph node. There was no evidence of Castleman's disease in the abdomen or pelvis. A CT on 11/07/2009 was stable.  Restaging CTs 07/10/2013 confirmed worsening of a single level 2 right cervical lymph node compared to a CT from 11/07/2009. No significant change in paratracheal lymphadenopathy.  Chest x-ray 02/24/2014-stable right paratracheal soft tissue mass  Chest x-ray 08/23/2014-stable right paratracheal soft tissue mass  CT 11/29/2014 with a slight increase in the right cervical lymph node compared to 07/10/2013 and a stable right paratracheal node  CT chest 07/11/2016 with interval stability of hypervascular right paratracheal lymphadenopathy. No new or progressive disease in the chest.  CT neck 07/11/2016-enlarging heterogeneous hyperdense mass lesion within the right carotid space. Approximate 10 mm bilateral level II hyperdense lymph nodes stable.  CT chest 05/29/2017-no change in the right paratracheal mass, no evidence of progressive disease  CT angiogram chest 07/01/2017-no pulmonary embolism, right paratracheal mass-minimally larger, measures 3.5 x 4.1 compared to 3.1 x 3.8 on 05/29/2017  CT angiogram chest 03/12/2018- no pulmonary embolism, slight decrease in size of right paratracheal mass 2. G2 P2, status post delivery of her second child in January 2015 3. Bilateral salpingectomy 06/05/2013 4. Right anterior cervical lymph node on exam 07/03/2013--stable. 5. CT neck 07/11/2016-enlarging heterogeneous hyperdense mass lesion within the right carotid space. Approximate 10 mm bilateral level II hyperdense lymph nodes stable. CTangioneck 08/31/2016-large 3.9 cm hypervascular mass within the right carotid space;small but very similar contralateral left carotid space hypervascular lesion measuring 1.2 cm.  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     Disposition: [name] appears unchanged.  There is no clinical evidence for progression of the Castleman's disease.  We will follow-up on the CT from today.  I reviewed the CT images and by my initial exam the mass has not changed significantly.  The etiology of the intermittent episodes of chest "tightness "is unclear.  I doubt this is related to the Castleman's disease.  She will return for an office visit in 6 months.  Betsy Coder, MD  06/19/2018  3:34 PM

## 2018-11-14 ENCOUNTER — Ambulatory Visit (INDEPENDENT_AMBULATORY_CARE_PROVIDER_SITE_OTHER): Payer: BC Managed Care – PPO

## 2018-11-14 ENCOUNTER — Ambulatory Visit
Admission: EM | Admit: 2018-11-14 | Discharge: 2018-11-14 | Disposition: A | Discharge status: Home / Self Care | Payer: BC Managed Care – PPO | Attending: Emergency Medicine | Admitting: Emergency Medicine

## 2018-11-14 DIAGNOSIS — R079 Chest pain, unspecified: Secondary | ICD-10-CM

## 2018-11-14 DIAGNOSIS — R300 Dysuria: Secondary | ICD-10-CM

## 2018-11-14 LAB — POCT URINALYSIS DIP (MANUAL ENTRY)
Bilirubin, UA: NEGATIVE
Glucose, UA: NEGATIVE mg/dL
Ketones, POC UA: NEGATIVE mg/dL
Nitrite, UA: NEGATIVE
Protein Ur, POC: NEGATIVE mg/dL
Spec Grav, UA: >=1.03 — AB (ref 1.010–1.025)
Urobilinogen, UA: 0.2 E.U./dL
pH, UA: 6 (ref 5.0–8.0)

## 2018-11-14 MED ORDER — OMEPRAZOLE 20 MG PO CPDR: 20.0000 mg | DELAYED_RELEASE_CAPSULE | Freq: Every day | ORAL | 0 refills | Status: DC

## 2018-11-14 NOTE — ED Provider Notes (Signed)
[name]    CSN: [id number] Arrival date & time: 11/14/18  1623     History   Chief Complaint Chief Complaint  Patient presents with  . Chest Pain    HPI Tracy Downs is a 38 y.o. female.   Tracy Downs presents with complaints of chest pain which has been coming and going for months now, has become more constant over the past two weeks. It is in various locations of the chest. Currently to the right chest. No shortness of breath . No cough or congestion. No fevers. No nausea, vomiting. She is eating and drinking. Has had increased heartburn with eating spicy foods, recently. Eating doesn't seem to increase the chest pain she is currently having, however. She will have intermittent dizziness, denies currently. Doesn't smoke. Not on any hormone therapy, tubes tied. No leg pain or swelling. She is working from home currently, her husband works in Engineer, technical sales. No known ill contacts.no diarrhea. No constipation. She does feel like her stomach is "bubbling" since her recent period last week. No other vaginal or urinary symptoms. She follows with oncology for Castleman disease, CT 06/19/18 with improvement of mass. Excision 11/2017. No known cardiac disease. No respiratory disease history either. States she also is having some pain with urination. No vaginal symptoms, denies concerns for STDs.    ROS per HPI, negative if not otherwise mentioned.      Past Medical History:  Diagnosis Date  . Castleman disease ([place])   . Deafness in left ear   . Pre-eclampsia 11/05/2016   during pregnancy only  . Tumor    left carotid body    Patient Active Problem List   Diagnosis Date Noted  . 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 Leiomyoma of uterus, unspecified 11/18/2012  . Castleman disease ([place]) 11/05/2012  . Nonspecific (abnormal) findings on radiological and other examination of body structure 06/16/2009  . COMPUTERIZED TOMOGRAPHY, CHEST,  ABNORMAL 06/16/2009    Past Surgical History:  Procedure Laterality Date  . ADENOIDECTOMY  1990s  . BILATERAL SALPINGECTOMY Bilateral 2015  . CAROTID ARTERY BODY TUMOR EMBOLIZATION  11/08/2016  . CAROTID BODY TUMOR EXCISION Right 11/09/2016   Procedure: TUMOR EXCISION CAROTID BODY, right;  Surgeon: Serafina Mitchell, MD;  Location: [facility];  Service: Vascular;  Laterality: Right;  . CAROTID BODY TUMOR EXCISION Left 12/05/2017   Procedure: TUMOR EXCISION CAROTID BODY LEFT;  Surgeon: Serafina Mitchell, MD;  Location: [facility];  Service: Vascular;  Laterality: Left;  . castleman disease biopsy    . IR ANGIO EXTERNAL CAROTID SEL EXT CAROTID UNI L MOD SED  12/04/2017  . IR ANGIO EXTERNAL CAROTID SEL EXT CAROTID UNI R MOD SED  11/08/2016  . IR ANGIO INTRA EXTRACRAN SEL COM CAROTID INNOMINATE BILAT MOD SED  11/08/2016  . IR ANGIO INTRA EXTRACRAN SEL COM CAROTID INNOMINATE BILAT MOD SED  12/04/2017  . IR ANGIO VERTEBRAL SEL SUBCLAVIAN INNOMINATE UNI R MOD SED  11/08/2016  . IR ANGIO VERTEBRAL SEL SUBCLAVIAN INNOMINATE UNI R MOD SED  12/04/2017  . IR ANGIO VERTEBRAL SEL VERTEBRAL UNI L MOD SED  11/08/2016  . IR ANGIOGRAM FOLLOW UP STUDY  11/08/2016  . IR ANGIOGRAM SELECTIVE EACH ADDITIONAL VESSEL  11/08/2016  . IR TRANSCATH/EMBOLIZ  11/08/2016  . LAPAROSCOPIC BILATERAL SALPINGECTOMY Bilateral 06/05/2013   Procedure: LAPAROSCOPIC BILATERAL SALPINGECTOMY;  Surgeon: Lahoma Crocker, MD;  Location: [facility];  Service: Gynecology;  Laterality: Bilateral;  . LEEP    .  RADIOLOGY WITH ANESTHESIA Right 11/08/2016   Procedure: Carotid artery body tumor embolization;  Surgeon: Luanne Bras, MD;  Location: [facility];  Service: Radiology;  Laterality: Right;  . RADIOLOGY WITH ANESTHESIA N/A 12/04/2017   Procedure: EMBOLIZATION;  Surgeon: Luanne Bras, MD;  Location: [facility];  Service: Radiology;  Laterality: N/A;    OB History    Gravida  2   Para  2   Term  2   Preterm      AB      Living  2     SAB       TAB      Ectopic      Multiple      Live Births  2            Home Medications    Prior to Admission medications   Medication Sig Start Date End Date Taking? Authorizing Provider  loratadine (CLARITIN) 10 MG tablet Take 10 mg by mouth daily as needed for allergies.    [provider]  Multiple Vitamin (MULTIVITAMIN WITH MINERALS) TABS tablet Take 1 tablet by mouth daily.    [provider]  Naphazoline HCl (CLEAR EYES OP) Place 2 drops into both eyes daily.     [provider]  omeprazole (PRILOSEC) 20 MG capsule Take 1 capsule (20 mg total) by mouth daily. 11/14/18   Zigmund Gottron, NP    Family History Family History  Problem Relation Age of Onset  . Cancer Mother        breast  . Breast cancer Mother 38  . Kidney disease Brother   . Diabetes Maternal Grandmother   . Hypertension Maternal Grandmother     Social History Social History   Tobacco Use  . Smoking status: Never Smoker  . Smokeless tobacco: Never Used  Substance Use Topics  . Alcohol use: Yes    Comment: 11/08/2016 "might have a drink twice/month"  . Drug use: No     Allergies   Amoxicillin, Hydrocodone, and Penicillins   Review of Systems Review of Systems   Physical Exam Triage Vital Signs ED Triage Vitals [11/14/18 1637]  Enc Vitals Group     BP (!) 147/95     Pulse Rate 87     Resp 16     Temp 98.5 F (36.9 C)     Temp Source Oral     SpO2 99 %     Weight      Height      Head Circumference      Peak Flow      Pain Score      Pain Loc      Pain Edu?      Excl. in [place]?    No data found.  Updated Vital Signs BP (!) 147/95 (BP Location: Left Arm)   Pulse 87   Temp 98.5 F (36.9 C) (Oral)   Resp 16   SpO2 99%    Physical Exam Constitutional:      General: She is not in acute distress.    Appearance: She is well-developed.  Cardiovascular:     Rate and Rhythm: Normal rate and regular rhythm.     Heart sounds: Normal heart sounds.   Pulmonary:     Effort: Pulmonary effort is normal.     Breath sounds: Normal breath sounds.  Chest:     Chest wall: No tenderness.       Comments: Right sided chest pain, pain ilicited with movement of  right arm; non tender on palpation Skin:    General: Skin is warm and dry.  Neurological:     Mental Status: She is alert and oriented to person, place, and time.    EKG:  NSR rate of 80 . Previous EKG was not available for review. No stwave changes as interpreted by me.    UC Treatments / Results  Labs (all labs ordered are listed, but only abnormal results are displayed) Labs Reviewed  POCT URINALYSIS DIP (MANUAL ENTRY) - Abnormal; Notable for the following components:      Result Value   Spec Grav, UA >=1.030 (*)    Blood, UA small (*)    Leukocytes, UA Trace (*)    All other components within normal limits  URINE CULTURE  CBC WITH DIFFERENTIAL/PLATELET  CERVICOVAGINAL ANCILLARY ONLY    EKG   Radiology Dg Chest 2 View  Result Date: 11/14/2018 CLINICAL DATA:  Chest pain.  History of Castleman disease EXAM: CHEST - 2 VIEW COMPARISON:  Aug 23, 2014 chest radiograph; CT chest June 19, 2018; earlier chest radiograph June 20, 2009 FINDINGS: Apparent right paratracheal adenopathy remains stable compared to studies dating back to 2011. No other adenopathy apparent. No edema or consolidation. Heart size and pulmonary vascularity are normal. No adenopathy. No bone lesions. Postoperative changes noted in the right neck region medially. IMPRESSION: Chronic right paratracheal adenopathy, likely due to changes from Castleman disease. No new adenopathy evident. No edema or consolidation. Cardiac silhouette within normal limits. Electronically Signed   By: Lowella Grip III M.D.   On: 11/14/2018 17:14    Procedures Procedures (including critical care time)  Medications Ordered in UC Medications - No data to display  Initial Impression / Assessment and Plan / UC Course  I have  reviewed the triage vital signs and the nursing notes.  Pertinent labs & imaging results that were available during my care of the patient were reviewed by me and considered in my medical decision making (see chart for details).     ekg and chest xray without acute findings. Urine inconclusive, culture obtained. Elevated specific gravity noted. Encouraged fluid intake. Vaginal cytology collected and pending. Will try omeprazole daily. Encouraged follow up with oncologist and/or PCP for persistent symptoms. nsaids for chest wall pain? Discussed as well. Return precautions provided. Patient verbalized understanding and agreeable to plan.  Ambulatory out of clinic without difficulty.    Final Clinical Impressions(s) / UC Diagnoses   Final diagnoses:  Nonspecific chest pain  Dysuria     Discharge Instructions     Your urine is concentrated which can be causing some of your urinary symptoms, I have sent for your urine to be cultured as well to ensure no infection. We will also test your vaginal discharge.  Your xray and ekg look well which is reassuring.  We will start daily omeprazole to see if this curbs any of your symptoms.  Please follow up with a PCP and/or oncologist for recheck of your chest pain.  Any worsening of symptoms, fevers, shortness of breath , dizziness, please go to the ER.    ED Prescriptions    Medication Sig Dispense Auth. Provider   omeprazole (PRILOSEC) 20 MG capsule Take 1 capsule (20 mg total) by mouth daily. 30 capsule Zigmund Gottron, NP     Controlled Substance Prescriptions [place] Controlled Substance Registry consulted? Not Applicable   Zigmund Gottron, NP 11/14/18 1758

## 2018-11-14 NOTE — ED Triage Notes (Signed)
Pt c/o pain across whole chest area and dizziness for the past week

## 2018-11-14 NOTE — Discharge Instructions (Signed)
Your urine is concentrated which can be causing some of your urinary symptoms, I have sent for your urine to be cultured as well to ensure no infection. We will also test your vaginal discharge.  Your xray and ekg look well which is reassuring.  We will start daily omeprazole to see if this curbs any of your symptoms.  Please follow up with a PCP and/or oncologist for recheck of your chest pain.  Any worsening of symptoms, fevers, shortness of breath , dizziness, please go to the ER.

## 2018-11-15 LAB — CBC WITH DIFFERENTIAL/PLATELET
Basophils Absolute: 0.1 10*3/uL (ref 0.0–0.2)
Basos: 1 %
EOS (ABSOLUTE): 0.2 10*3/uL (ref 0.0–0.4)
Eos: 2 %
Hematocrit: 46.9 % — ABNORMAL HIGH (ref 34.0–46.6)
Hemoglobin: 15.8 g/dL (ref 11.1–15.9)
Immature Grans (Abs): 0 10*3/uL (ref 0.0–0.1)
Immature Granulocytes: 0 %
Lymphocytes Absolute: 2.3 10*3/uL (ref 0.7–3.1)
Lymphs: 30 %
MCH: 30 pg (ref 26.6–33.0)
MCHC: 33.7 g/dL (ref 31.5–35.7)
MCV: 89 fL (ref 79–97)
Monocytes Absolute: 0.5 10*3/uL (ref 0.1–0.9)
Monocytes: 6 %
Neutrophils Absolute: 4.7 10*3/uL (ref 1.4–7.0)
Neutrophils: 61 %
Platelets: 312 10*3/uL (ref 150–450)
RBC: 5.27 x10E6/uL (ref 3.77–5.28)
RDW: 12 % (ref 11.7–15.4)
WBC: 7.8 10*3/uL (ref 3.4–10.8)

## 2018-11-17 ENCOUNTER — Telehealth (HOSPITAL_COMMUNITY): Payer: Self-pay | Admitting: Emergency Medicine

## 2018-11-17 NOTE — Telephone Encounter (Signed)
Blood work negative, pending urine culture and cervical swab

## 2018-11-18 LAB — URINE CULTURE

## 2018-11-21 LAB — CERVICOVAGINAL ANCILLARY ONLY
Bacterial vaginitis: NEGATIVE
Candida vaginitis: NEGATIVE
Chlamydia: NEGATIVE
Neisseria Gonorrhea: NEGATIVE
Trichomonas: NEGATIVE

## 2018-12-17 ENCOUNTER — Telehealth: Payer: Self-pay | Admitting: Nurse Practitioner

## 2018-12-17 NOTE — Telephone Encounter (Signed)
Called pt per 9/1 sch message - unable to reach p t . Left message with appt date and time

## 2018-12-18 ENCOUNTER — Ambulatory Visit: Payer: BC Managed Care – PPO | Admitting: Nurse Practitioner

## 2018-12-19 ENCOUNTER — Inpatient Hospital Stay: Payer: BC Managed Care – PPO | Admitting: Nurse Practitioner

## 2019-01-23 ENCOUNTER — Inpatient Hospital Stay: Payer: BC Managed Care – PPO | Attending: Nurse Practitioner | Admitting: Nurse Practitioner

## 2019-01-23 ENCOUNTER — Other Ambulatory Visit: Payer: Self-pay

## 2019-01-23 ENCOUNTER — Encounter: Payer: Self-pay | Admitting: Nurse Practitioner

## 2019-01-23 VITALS — BP 133/95 | HR 101 | Temp 98.5°F | Resp 17 | Ht 59.0 in | Wt 163.9 lb

## 2019-01-23 DIAGNOSIS — D47Z2 Castleman disease: Secondary | ICD-10-CM

## 2019-01-23 DIAGNOSIS — R079 Chest pain, unspecified: Secondary | ICD-10-CM | POA: Insufficient documentation

## 2019-01-23 DIAGNOSIS — R208 Other disturbances of skin sensation: Secondary | ICD-10-CM | POA: Diagnosis not present

## 2019-01-23 NOTE — Progress Notes (Signed)
[facility] OFFICE PROGRESS NOTE   Diagnosis: Castleman's disease  INTERVAL HISTORY:   [name] returns as scheduled.  Overall she feels well.  No fevers or sweats.  She has a good appetite.  She continues to have intermittent chest pain with associated "burning" in her throat.  She tried Prilosec a few months ago and noted some improvement.  Symptoms are occurring more frequently.  Spicy foods tend to exacerbate.  She had similar symptoms when she was pregnant.  Objective:  Vital signs in last 24 hours:  Blood pressure (!) 133/95, pulse (!) 101, temperature 98.5 F (36.9 C), temperature source Temporal, resp. rate 17, height 4\' 11"  (1.499 m), weight 163 lb 14.4 oz (74.3 kg), SpO2 100 %.    HEENT: Neck without mass. Lymphatics: No palpable cervical, supraclavicular, axillary or inguinal lymph nodes. GI: Abdomen soft and nontender.  No hepatomegaly.  No splenomegaly.  No mass. Vascular: No leg edema.   Lab Results:  Lab Results  Component Value Date   WBC 7.8 11/14/2018   HGB 15.8 11/14/2018   HCT 46.9 (H) 11/14/2018   MCV 89 11/14/2018   PLT 312 11/14/2018   NEUTROABS 4.7 11/14/2018    Imaging:  No results found.  Medications: I have reviewed the patient's current medications.  Assessment/Plan: 1. Castleman's disease. A CT of the chest 06/09/2009 confirmed a right paratracheal mass. A mediastinoscopy 06/21/2009 with biopsy of the mediastinal mass confirmed Castleman's disease. Staging CTs of the chest, abdomen and pelvis 08/12/2009 confirmed a right paratracheal mass, a superior mediastinal lymph node and a left cervical/supraclavicular lymph node. There was no evidence of Castleman's disease in the abdomen or pelvis. A CT on 11/07/2009 was stable.  Restaging CTs 07/10/2013 confirmed worsening of a single level 2 right cervical lymph node compared to a CT from 11/07/2009. No significant change in paratracheal lymphadenopathy.  Chest x-ray  02/24/2014-stable right paratracheal soft tissue mass  Chest x-ray 08/23/2014-stable right paratracheal soft tissue mass  CT 11/29/2014 with a slight increase in the right cervical lymph node compared to 07/10/2013 and a stable right paratracheal node  CT chest 07/11/2016 with interval stability of hypervascular right paratracheal lymphadenopathy. No new or progressive disease in the chest.  CT neck 07/11/2016-enlarging heterogeneous hyperdense mass lesion within the right carotid space. Approximate 10 mm bilateral level II hyperdense lymph nodes stable.  CT chest 05/29/2017-no change in the right paratracheal mass, no evidence of progressive disease  CT angiogram chest 07/01/2017-no pulmonary embolism, right paratracheal mass-minimally larger, measures 3.5 x 4.1 compared to 3.1 x 3.8 on 05/29/2017  CT angiogram chest 03/12/2018- no pulmonary embolism, slight decrease in size of right paratracheal mass  CT chest 06/19/2018- 2.9 cm right paratracheal nodal mass corresponding to patient's known Castleman's disease, mildly improved 2. G2 P2, status post delivery of her second child in January 2015 3. Bilateral salpingectomy 06/05/2013 4. Right anterior cervical lymph node on exam 07/03/2013--stable. 5. CT neck 07/11/2016-enlarging heterogeneous hyperdense mass lesion within the right carotid space. Approximate 10 mm bilateral level II hyperdense lymph nodes stable. CTangioneck 08/31/2016-large 3.9 cm hypervascular mass within the right carotid space;small but very similar contralateral left carotid space hypervascular lesion measuring 1.2 cm.  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    Disposition: [name] appears stable.  There is no clinical evidence for progression of  the  Castleman's disease.  She will undergo a restaging chest CT in approximately 5 months which will be at a one-year interval from the most recent scan.  She continues to have intermittent chest pain, associated burning sensation in the throat.  We made a referral to gastroenterology.  She will return for a follow-up visit in 5 months, chest CT a few days prior.  She will contact the office in the interim with any problems.  Plan reviewed with Dr. Benay Spice.    [name] ANP/GNP-BC   01/23/2019  3:39 PM

## 2019-01-26 ENCOUNTER — Telehealth: Payer: Self-pay | Admitting: Nurse Practitioner

## 2019-01-26 NOTE — Telephone Encounter (Signed)
Scheduled per los. Sent referral. Called and left msg. Mailed printout

## 2019-02-20 ENCOUNTER — Encounter: Payer: Self-pay | Admitting: Family Medicine

## 2019-02-24 ENCOUNTER — Ambulatory Visit
Admission: EM | Admit: 2019-02-24 | Discharge: 2019-02-24 | Disposition: A | Discharge status: Home / Self Care | Payer: BC Managed Care – PPO | Attending: Physician Assistant | Admitting: Physician Assistant

## 2019-02-24 ENCOUNTER — Other Ambulatory Visit: Payer: Self-pay

## 2019-02-24 DIAGNOSIS — R0989 Other specified symptoms and signs involving the circulatory and respiratory systems: Secondary | ICD-10-CM | POA: Diagnosis not present

## 2019-02-24 MED ORDER — LIDOCAINE VISCOUS HCL 2 % MT SOLN: OROMUCOSAL | 0 refills | Status: DC

## 2019-02-24 MED ORDER — IPRATROPIUM BROMIDE 0.06 % NA SOLN: 2.0000 | Freq: Four times a day (QID) | NASAL | 0 refills | Status: DC

## 2019-02-24 NOTE — ED Triage Notes (Addendum)
Pt c/o pain to her lt neck incision from carotid surgery a year ago. Pain started 2wks ago.

## 2019-02-24 NOTE — Discharge Instructions (Addendum)
No alarming signs on exam. Start lidocaine for sore throat, do not eat or drink for the next 40 mins after use as it can stunt your gag reflex. Start atrovent at this time and start prilosec 40mg  as directed. Keep hydrated, urine should be clear to pale yellow in color. Follow up with ENT for further evaluation if symptoms not improving.

## 2019-02-24 NOTE — ED Provider Notes (Signed)
[name]    CSN: [id number] Arrival date & time: 02/24/19  1648      History   Chief Complaint Chief Complaint  Patient presents with  . Neck Pain    HPI Tracy Downs is a 38 y.o. female.   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  States she feels swelling and pain to the left tonsillar area.  Pain can be worse with movement and swallowing.  States the pain is constant, feels sharp/"poking" in sensation, as if something is there. Denies injury/trauma.  Although with pain, still able to swallow.  Denies tripoding, drooling, trismus.  Denies URI symptoms such as cough, congestion, rhinorrhea.  Denies fever, chills, body aches.  Denies abdominal pain, nausea, vomiting, diarrhea.  She was recently seen for GERD by PCP, and was restarted on Prilosec with good relief.  Denies chest pain, shortness of breath, palpitation.  Denies weakness, dizziness, syncope.  Denies exertional chest pain, exertional fatigue, dyspnea on exertion.  Denies personal or family history of heart disease.  Patient states this happened a few years ago, saw an ENT without known cause of symptoms.  Symptoms resolved on own after a few days.  She is being monitored by oncology for Castleman disease, last visit 1 month ago.      Past Medical History:  Diagnosis Date  . 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 Deafness in left ear   . Pre-eclampsia 11/05/2016   during pregnancy only  . Tumor    left carotid body    Patient Active Problem List   Diagnosis Date Noted  . 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 Leiomyoma of uterus, unspecified 11/18/2012  . 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 Nonspecific (abnormal) findings on radiological and other examination of body structure 06/16/2009  . COMPUTERIZED TOMOGRAPHY, CHEST, ABNORMAL 06/16/2009     Past Surgical History:  Procedure Laterality Date  . ADENOIDECTOMY  1990s  . BILATERAL SALPINGECTOMY Bilateral 2015  . CAROTID ARTERY BODY TUMOR EMBOLIZATION  11/08/2016  . CAROTID BODY TUMOR EXCISION Right 11/09/2016   Procedure: TUMOR EXCISION CAROTID BODY, right;  Surgeon: Serafina Mitchell, MD;  Location: [facility];  Service: Vascular;  Laterality: Right;  . CAROTID BODY TUMOR EXCISION Left 12/05/2017   Procedure: TUMOR EXCISION CAROTID BODY LEFT;  Surgeon: Serafina Mitchell, MD;  [facility];  Service: Vascular;  Laterality: Left;  . castleman disease biopsy    . IR ANGIO EXTERNAL CAROTID SEL EXT CAROTID UNI L MOD SED  12/04/2017  . IR ANGIO EXTERNAL CAROTID SEL EXT CAROTID UNI R MOD SED  11/08/2016  . IR ANGIO INTRA EXTRACRAN SEL COM CAROTID INNOMINATE BILAT MOD SED  11/08/2016  . IR ANGIO INTRA EXTRACRAN SEL COM CAROTID INNOMINATE BILAT MOD SED  12/04/2017  . IR ANGIO VERTEBRAL SEL SUBCLAVIAN INNOMINATE UNI R MOD SED  11/08/2016  . IR ANGIO VERTEBRAL SEL SUBCLAVIAN INNOMINATE UNI R MOD SED  12/04/2017  . IR ANGIO VERTEBRAL SEL VERTEBRAL UNI L MOD SED  11/08/2016  . IR ANGIOGRAM FOLLOW UP STUDY  11/08/2016  . IR ANGIOGRAM SELECTIVE EACH ADDITIONAL VESSEL  11/08/2016  . IR TRANSCATH/EMBOLIZ  11/08/2016  . LAPAROSCOPIC BILATERAL SALPINGECTOMY Bilateral 06/05/2013   Procedure: LAPAROSCOPIC BILATERAL SALPINGECTOMY;  Surgeon: Lahoma Crocker, MD;  Location: [facility];  Service: Gynecology;  Laterality: Bilateral;  . LEEP    .  RADIOLOGY WITH ANESTHESIA Right 11/08/2016   Procedure: Carotid artery body tumor embolization;  Surgeon: Luanne Bras, MD;  Location: [facility];  Service: Radiology;  Laterality: Right;  . RADIOLOGY WITH ANESTHESIA N/A 12/04/2017   Procedure: EMBOLIZATION;  Surgeon: Luanne Bras, MD;  Location: [facility];  Service: Radiology;  Laterality: N/A;    OB History    Gravida  2   Para  2   Term  2   Preterm      AB      Living  2     SAB      TAB      Ectopic       Multiple      Live Births  2            Home Medications    Prior to Admission medications   Medication Sig Start Date End Date Taking? Authorizing Provider  ipratropium (ATROVENT) 0.06 % nasal spray Place 2 sprays into both nostrils 4 (four) times daily. 02/24/19   Armani Brar V, PA-C  lidocaine (XYLOCAINE) 2 % solution 5-15 mL to gurgle as needed. 02/24/19   Tasia Catchings, Kaysey Berndt V, PA-C  loratadine (CLARITIN) 10 MG tablet Take 10 mg by mouth daily as needed for allergies.    [provider]  Multiple Vitamin (MULTIVITAMIN WITH MINERALS) TABS tablet Take 1 tablet by mouth daily.    [provider]  Naphazoline HCl (CLEAR EYES OP) Place 2 drops into both eyes daily.     [provider]  omeprazole (PRILOSEC) 20 MG capsule Take 1 capsule (20 mg total) by mouth daily. 11/14/18   Zigmund Gottron, NP    Family History Family History  Problem Relation Age of Onset  . Cancer Mother        breast  . Breast cancer Mother 19  . Kidney disease Brother   . Diabetes Maternal Grandmother   . Hypertension Maternal Grandmother     Social History Social History   Tobacco Use  . Smoking status: Never Smoker  . Smokeless tobacco: Never Used  Substance Use Topics  . Alcohol use: Yes    Comment: 11/08/2016 "might have a drink twice/month"  . Drug use: No     Allergies   Amoxicillin, Hydrocodone, and Penicillins   Review of Systems Review of Systems  Reason unable to perform ROS: See HPI as above.     Physical Exam Triage Vital Signs ED Triage Vitals  Enc Vitals Group     BP 02/24/19 1658 (!) 141/103     Pulse Rate 02/24/19 1658 92     Resp 02/24/19 1658 18     Temp 02/24/19 1658 98.6 F (37 C)     Temp Source 02/24/19 1658 Oral     SpO2 02/24/19 1658 98 %     Weight --      Height --      Head Circumference --      Peak Flow --      Pain Score 02/24/19 1659 6     Pain Loc --      Pain Edu? --      Excl. in [place]? --    No data found.  Updated Vital  Signs BP (!) 141/103 (BP Location: Left Arm)   Pulse 92   Temp 98.6 F (37 C) (Oral)   Resp 18   LMP 02/24/2019   SpO2 98%   Physical Exam Constitutional:      General: She is not in acute  distress.    Appearance: Normal appearance. She is not ill-appearing, toxic-appearing or diaphoretic.  HENT:     Head: Normocephalic and atraumatic.     Right Ear: Tympanic membrane, ear canal and external ear normal. Tympanic membrane is not erythematous or bulging.     Left Ear: Tympanic membrane, ear canal and external ear normal. Tympanic membrane is not erythematous or bulging.     Nose:     Right Sinus: No maxillary sinus tenderness or frontal sinus tenderness.     Left Sinus: No maxillary sinus tenderness or frontal sinus tenderness.     Mouth/Throat:     Mouth: Mucous membranes are moist.     Dentition: Normal dentition.     Pharynx: Oropharynx is clear. Uvula midline. No pharyngeal swelling, oropharyngeal exudate, posterior oropharyngeal erythema or uvula swelling.     Tonsils: No tonsillar exudate.     Comments: Floor of mouth soft to palpation Neck:     Musculoskeletal: Normal range of motion and neck supple.     Thyroid: No thyroid mass, thyromegaly or thyroid tenderness.     Trachea: Trachea normal.  Cardiovascular:     Rate and Rhythm: Normal rate and regular rhythm.     Heart sounds: Normal heart sounds. No murmur. No friction rub. No gallop.   Pulmonary:     Effort: Pulmonary effort is normal. No accessory muscle usage, prolonged expiration, respiratory distress or retractions.     Comments: Lungs clear to auscultation without adventitious lung sounds. Lymphadenopathy:     Cervical: No cervical adenopathy.  Neurological:     General: No focal deficit present.     Mental Status: She is alert and oriented to person, place, and time.      UC Treatments / Results  Labs (all labs ordered are listed, but only abnormal results are displayed) Labs Reviewed - No data to  display  EKG   Radiology No results found.  Procedures Procedures (including critical care time)  Medications Ordered in UC Medications - No data to display  Initial Impression / Assessment and Plan / UC Course  I have reviewed the triage vital signs and the nursing notes.  Pertinent labs & imaging results that were available during my care of the patient were reviewed by me and considered in my medical decision making (see chart for details).    Exam without obvious cause of symptoms at this time.  Will have patient try lidocaine at home to see if this relieves symptoms.  Will start Atrovent and increase Prilosec to 40 mg daily for possible postnasal drip or GERD causing globus sensation.  Patient to follow-up with ENT for further evaluation if symptoms not improving.  Return precautions given.  Patient expresses understanding and agrees to plan.  Final Clinical Impressions(s) / UC Diagnoses   Final diagnoses:  Globus sensation   ED Prescriptions    Medication Sig Dispense Auth. Provider   ipratropium (ATROVENT) 0.06 % nasal spray Place 2 sprays into both nostrils 4 (four) times daily. 15 mL Delroy Ordway V, PA-C   lidocaine (XYLOCAINE) 2 % solution 5-15 mL to gurgle as needed. 100 mL Ok Edwards, PA-C     PDMP not reviewed this encounter.   Ok Edwards, PA-C 02/24/19 1953

## 2019-03-10 ENCOUNTER — Emergency Department (HOSPITAL_COMMUNITY): Payer: BC Managed Care – PPO

## 2019-03-10 ENCOUNTER — Other Ambulatory Visit: Payer: Self-pay

## 2019-03-10 ENCOUNTER — Emergency Department (HOSPITAL_COMMUNITY)
Admission: EM | Admit: 2019-03-10 | Discharge: 2019-03-10 | Disposition: A | Discharge status: Home / Self Care | Payer: BC Managed Care – PPO | Attending: Emergency Medicine | Admitting: Emergency Medicine

## 2019-03-10 ENCOUNTER — Encounter (HOSPITAL_COMMUNITY): Payer: Self-pay | Admitting: Emergency Medicine

## 2019-03-10 DIAGNOSIS — Z79899 Other long term (current) drug therapy: Secondary | ICD-10-CM | POA: Diagnosis not present

## 2019-03-10 DIAGNOSIS — K219 Gastro-esophageal reflux disease without esophagitis: Secondary | ICD-10-CM | POA: Insufficient documentation

## 2019-03-10 DIAGNOSIS — R0989 Other specified symptoms and signs involving the circulatory and respiratory systems: Secondary | ICD-10-CM | POA: Diagnosis not present

## 2019-03-10 DIAGNOSIS — J029 Acute pharyngitis, unspecified: Secondary | ICD-10-CM | POA: Diagnosis present

## 2019-03-10 LAB — I-STAT CHEM 8, ED
BUN: 12 mg/dL (ref 6–20)
Calcium, Ion: 1.21 mmol/L (ref 1.15–1.40)
Chloride: 100 mmol/L (ref 98–111)
Creatinine, Ser: 0.9 mg/dL (ref 0.44–1.00)
Glucose, Bld: 86 mg/dL (ref 70–99)
HCT: 49 % — ABNORMAL HIGH (ref 36.0–46.0)
Hemoglobin: 16.7 g/dL — ABNORMAL HIGH (ref 12.0–15.0)
Potassium: 4.1 mmol/L (ref 3.5–5.1)
Sodium: 138 mmol/L (ref 135–145)
TCO2: 28 mmol/L (ref 22–32)

## 2019-03-10 LAB — I-STAT BETA HCG BLOOD, ED (MC, WL, AP ONLY): I-stat hCG, quantitative: <5 m[IU]/mL (ref <5)

## 2019-03-10 MED ORDER — IOHEXOL 300 MG/ML  SOLN
75.0000 mL | Freq: Once | INTRAMUSCULAR | Status: AC | PRN
  Administered 2019-03-10: 75 mL via INTRAVENOUS

## 2019-03-10 MED ORDER — LIDOCAINE VISCOUS HCL 2 % MT SOLN
15.0000 mL | Freq: Once | OROMUCOSAL | Status: AC
  Administered 2019-03-10: 15 mL via OROMUCOSAL
  Filled 2019-03-10: qty 15

## 2019-03-10 MED ORDER — LIDOCAINE VISCOUS HCL 2 % MT SOLN: 15.0000 mL | OROMUCOSAL | 0 refills | Status: DC | PRN

## 2019-03-10 NOTE — ED Provider Notes (Signed)
[facility] Provider Note   CSN: [id number] Arrival date & time: 03/10/19  1229     History   Chief Complaint Chief Complaint  Patient presents with   Sore Throat    HPI Tracy Downs is a 38 y.o. 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  She states that for the past 2 weeks she has had a feeling like something is stuck in her throat.  She feels like it is moving around.  It started suddenly and was not after eating.  She reports a similar feeling approximately 1 year ago when she thought she may have swallowed a piece of plastic.  She saw her ENT doctor at that time who looked in her throat and did not see anything and the sensation went away.  She has been able to eat and drink.  She has not had any vomiting or trouble breathing.  She went to UC last week but they couldn't do much and increased her dose of Prilosec and gave her an rx for Lidocaine. She thought it may be due to reflux as she was having some heartburn but she's been taking meds for this and the heartburn has improved but the FB sensation has not. She has an appointment with ENT but is not until next week.  The pain became severe today and therefore she decided to come to the ED tonight.     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   Leiomyoma of uterus, unspecified 11/18/2012   Castleman disease ([place]) 11/05/2012   Nonspecific (abnormal) findings on radiological and other examination of body structure 06/16/2009   COMPUTERIZED TOMOGRAPHY, CHEST, ABNORMAL 06/16/2009    Past Surgical History:   Procedure Laterality Date   ADENOIDECTOMY  1990s   BILATERAL SALPINGECTOMY Bilateral 2015   CAROTID ARTERY BODY TUMOR EMBOLIZATION  11/08/2016   CAROTID BODY TUMOR EXCISION Right 11/09/2016   Procedure: TUMOR EXCISION CAROTID BODY, right;  Surgeon: Serafina Mitchell, MD;  Location[facility];  Service: Vascular;  Laterality: Right;   CAROTID BODY TUMOR EXCISION Left 12/05/2017   Procedure: TUMOR EXCISION CAROTID BODY LEFT;  Surgeon: Serafina Mitchell, MD;  Location[facility];  Service: Vascular;  Laterality: Left;   castleman disease biopsy     IR ANGIO EXTERNAL CAROTID SEL EXT CAROTID UNI L MOD SED  12/04/2017   IR ANGIO EXTERNAL CAROTID SEL EXT CAROTID UNI R MOD SED  11/08/2016   IR ANGIO INTRA EXTRACRAN SEL COM CAROTID INNOMINATE BILAT MOD SED  11/08/2016   IR ANGIO INTRA EXTRACRAN SEL COM CAROTID INNOMINATE BILAT MOD SED  12/04/2017   IR ANGIO VERTEBRAL SEL SUBCLAVIAN INNOMINATE UNI R MOD SED  11/08/2016   IR ANGIO VERTEBRAL SEL SUBCLAVIAN INNOMINATE UNI R MOD SED  12/04/2017   IR ANGIO VERTEBRAL SEL VERTEBRAL UNI L MOD SED  11/08/2016   IR ANGIOGRAM FOLLOW UP STUDY  11/08/2016   IR ANGIOGRAM SELECTIVE EACH ADDITIONAL VESSEL  11/08/2016   IR TRANSCATH/EMBOLIZ  11/08/2016   LAPAROSCOPIC BILATERAL SALPINGECTOMY Bilateral 06/05/2013  Procedure: LAPAROSCOPIC BILATERAL SALPINGECTOMY;  Surgeon: Lahoma Crocker, MD;  Location: [facility];  Service: Gynecology;  Laterality: Bilateral;   LEEP     RADIOLOGY WITH ANESTHESIA Right 11/08/2016   Procedure: Carotid artery body tumor embolization;  Surgeon: Luanne Bras, MD;  Location: [facility];  Service: Radiology;  Laterality: Right;   RADIOLOGY WITH ANESTHESIA N/A 12/04/2017   Procedure: EMBOLIZATION;  Surgeon: Luanne Bras, MD;  Location: [facility];  Service: Radiology;  Laterality: N/A;     OB History    Gravida  2   Para  2   Term  2   Preterm      AB      Living  2     SAB      TAB      Ectopic      Multiple      Live  Births  2            Home Medications    Prior to Admission medications   Medication Sig Start Date End Date Taking? Authorizing Provider  Multiple Vitamin (MULTIVITAMIN WITH MINERALS) TABS tablet Take 1 tablet by mouth daily.   Yes [provider]  omeprazole (PRILOSEC) 20 MG capsule Take 1 capsule (20 mg total) by mouth daily. 11/14/18  Yes Zigmund Gottron, NP    Family History Family History  Problem Relation Age of Onset   Cancer Mother        breast   Breast cancer Mother 52   Kidney disease Brother    Diabetes Maternal Grandmother    Hypertension Maternal Grandmother     Social History Social History   Tobacco Use   Smoking status: Never Smoker   Smokeless tobacco: Never Used  Substance Use Topics   Alcohol use: Yes    Comment: 11/08/2016 "might have a drink twice/month"   Drug use: No     Allergies   Amoxicillin, Hydrocodone, and Penicillins   Review of Systems Review of Systems  Constitutional: Negative for fever.  HENT: Positive for sore throat. Negative for facial swelling and trouble swallowing.      Physical Exam Updated Vital Signs BP (!) 142/92    Pulse (!) 106    Temp 98.6 F (37 C) (Oral)    Resp 16    LMP 02/24/2019    SpO2 98%   Physical Exam Vitals signs and nursing note reviewed.  Constitutional:      General: She is not in acute distress.    Appearance: She is well-developed. She is not ill-appearing.  HENT:     Head: Normocephalic and atraumatic.     Mouth/Throat:     Mouth: Mucous membranes are moist.  Eyes:     General: No scleral icterus.       Right eye: No discharge.        Left eye: No discharge.     Conjunctiva/sclera: Conjunctivae normal.     Pupils: Pupils are equal, round, and reactive to light.  Neck:     Musculoskeletal: Normal range of motion.     Comments: Scar over the left side of the neck Cardiovascular:     Rate and Rhythm: Normal rate and regular rhythm.  Pulmonary:     Effort:  Pulmonary effort is normal. No respiratory distress.     Breath sounds: Normal breath sounds.  Abdominal:     General: There is no distension.  Skin:    General: Skin is warm and dry.  Neurological:  Mental Status: She is alert and oriented to person, place, and time.  Psychiatric:        Behavior: Behavior normal.      ED Treatments / Results  Labs (all labs ordered are listed, but only abnormal results are displayed) Labs Reviewed  I-STAT CHEM 8, ED - Abnormal; Notable for the following components:      Result Value   Hemoglobin 16.7 (*)    HCT 49.0 (*)    All other components within normal limits  I-STAT BETA HCG BLOOD, ED ([facility], [facility], [facility] ONLY)    EKG None  Radiology Ct Soft Tissue Neck W Contrast  Result Date: 03/10/2019 CLINICAL DATA:  Ingested foreign body.  Neck pain. History of bilateral carotid body tumor excision. History of Castleman's disease. EXAM: CT NECK WITH CONTRAST TECHNIQUE: Multidetector CT imaging of the neck was performed using the standard protocol following the bolus administration of intravenous contrast. CONTRAST:  91mL OMNIPAQUE IOHEXOL 300 MG/ML  SOLN COMPARISON:  CT neck 08/31/2016 FINDINGS: Pharynx and larynx: Normal. No mass or swelling. No radiopaque foreign body in the pharynx or proximal esophagus. Normal airway. Salivary glands: No inflammation, mass, or stone. Thyroid: Normal Lymph nodes: No enlarged lymph nodes in the neck. Right paratracheal lymph node mass shows hyperenhancement with prominent surrounding vessels. The mass measures 33 x 33 mm with mild improvement since 2018. Vascular: Postop resection of bilateral carotid body tumor without recurrence. Normal vascular enhancement. Limited intracranial: Negative Visualized orbits: Negative Mastoids and visualized paranasal sinuses: Mild mucosal edema paranasal sinuses. Skeleton: Prominent cervical kyphosis likely due to patient positioning. No acute skeletal abnormality. Upper chest: Negative  Other: None IMPRESSION: 1. Negative for radiopaque foreign body in the pharynx or esophagus. 2. Postop resection of bilateral carotid body tumors without recurrence 3. 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   On: 03/10/2019 19:10    Procedures Procedures (including critical care time)  Medications Ordered in ED Medications  lidocaine (XYLOCAINE) 2 % viscous mouth solution 15 mL (15 mLs Mouth/Throat Given 03/10/19 1624)  iohexol (OMNIPAQUE) 300 MG/ML solution 75 mL (75 mLs Intravenous Contrast Given 03/10/19 1843)    Initial Impression / Assessment and Plan / ED Course  I have reviewed the triage vital signs and the nursing notes.  Pertinent labs & imaging results that were available during my care of the patient were reviewed by me and considered in my medical decision making (see chart for details).  38 year old female presents with globus sensation in her throat for 2 weeks.  She is mildly hypertensive but otherwise vital signs are normal.  She has been tolerating p.o. without difficulty.  She states pain got worse today and that is why she came to the ED.  No abnormalities were seen on exam.  CT was obtained which does not show radio opaque foreign body.  She was given viscous lidocaine and states symptoms are improved.  She did not pick up the lidocaine prescription that was given to her at urgent care. Advised f/u with Dr. Redmond Baseman  Final Clinical Impressions(s) / ED Diagnoses   Final diagnoses:  Globus sensation  Gastroesophageal reflux disease, unspecified whether esophagitis present    ED Discharge Orders    None       Recardo Evangelist, PA-C 03/10/19 1926    Virgel Manifold, MD 03/10/19 1956

## 2019-03-10 NOTE — ED Triage Notes (Signed)
Pt arrives to ED with c/c of feeling like something is stuck in here throat. Pt states she has felt like this on and off since having a tumor removed from right side of her throat. Pt states this has been constant since 2 weeks. No sob or trouble breathing

## 2019-03-10 NOTE — ED Notes (Signed)
Pt verbalized discharge instructions. Follow up care and prescriptions reviewed, pt had no further questions.

## 2019-03-10 NOTE — Discharge Instructions (Signed)
Use lidocaine every 4-6 hours as needed for pain Please follow up with Dr. Redmond Baseman Return if worsening

## 2019-03-10 NOTE — ED Notes (Signed)
Patient transported to CT 

## 2019-04-24 ENCOUNTER — Other Ambulatory Visit: Payer: Self-pay | Admitting: Obstetrics and Gynecology

## 2019-04-24 DIAGNOSIS — Z1231 Encounter for screening mammogram for malignant neoplasm of breast: Secondary | ICD-10-CM

## 2019-06-01 ENCOUNTER — Other Ambulatory Visit: Payer: Self-pay

## 2019-06-01 ENCOUNTER — Ambulatory Visit
Admission: RE | Admit: 2019-06-01 | Discharge: 2019-06-01 | Disposition: A | Discharge status: Home / Self Care | Payer: BC Managed Care – PPO | Source: Ambulatory Visit | Attending: Obstetrics and Gynecology | Admitting: Obstetrics and Gynecology

## 2019-06-01 DIAGNOSIS — Z1231 Encounter for screening mammogram for malignant neoplasm of breast: Secondary | ICD-10-CM

## 2019-06-22 ENCOUNTER — Inpatient Hospital Stay: Payer: BC Managed Care – PPO | Attending: Oncology

## 2019-06-22 ENCOUNTER — Encounter (HOSPITAL_COMMUNITY): Payer: Self-pay

## 2019-06-22 ENCOUNTER — Other Ambulatory Visit: Payer: Self-pay

## 2019-06-22 ENCOUNTER — Ambulatory Visit (HOSPITAL_COMMUNITY)
Admission: RE | Admit: 2019-06-22 | Discharge: 2019-06-22 | Disposition: A | Discharge status: Home / Self Care | Payer: BC Managed Care – PPO | Source: Ambulatory Visit | Attending: Nurse Practitioner | Admitting: Nurse Practitioner

## 2019-06-22 DIAGNOSIS — D47Z2 Castleman disease: Secondary | ICD-10-CM | POA: Diagnosis present

## 2019-06-22 DIAGNOSIS — I10 Essential (primary) hypertension: Secondary | ICD-10-CM | POA: Insufficient documentation

## 2019-06-22 DIAGNOSIS — Z79899 Other long term (current) drug therapy: Secondary | ICD-10-CM | POA: Insufficient documentation

## 2019-06-22 DIAGNOSIS — Z86711 Personal history of pulmonary embolism: Secondary | ICD-10-CM | POA: Diagnosis not present

## 2019-06-22 DIAGNOSIS — M542 Cervicalgia: Secondary | ICD-10-CM | POA: Diagnosis not present

## 2019-06-22 LAB — CMP (CANCER CENTER ONLY)
ALT: 24 U/L (ref 0–44)
AST: 22 U/L (ref 15–41)
Albumin: 4.1 g/dL (ref 3.5–5.0)
Alkaline Phosphatase: 56 U/L (ref 38–126)
Anion gap: 7 (ref 5–15)
BUN: 16 mg/dL (ref 6–20)
CO2: 29 mmol/L (ref 22–32)
Calcium: 9.1 mg/dL (ref 8.9–10.3)
Chloride: 105 mmol/L (ref 98–111)
Creatinine: 0.91 mg/dL (ref 0.44–1.00)
GFR, Est AFR Am: >60 mL/min (ref >60)
GFR, Estimated: >60 mL/min (ref >60)
Glucose, Bld: 95 mg/dL (ref 70–99)
Potassium: 4.7 mmol/L (ref 3.5–5.1)
Sodium: 141 mmol/L (ref 135–145)
Total Bilirubin: 0.4 mg/dL (ref 0.3–1.2)
Total Protein: 7.1 g/dL (ref 6.5–8.1)

## 2019-06-22 MED ORDER — SODIUM CHLORIDE (PF) 0.9 % IJ SOLN
INTRAMUSCULAR | Status: AC
  Filled 2019-06-22: qty 50

## 2019-06-22 MED ORDER — IOHEXOL 300 MG/ML  SOLN
75.0000 mL | Freq: Once | INTRAMUSCULAR | Status: AC | PRN
  Administered 2019-06-22: 75 mL via INTRAVENOUS

## 2019-06-29 ENCOUNTER — Other Ambulatory Visit: Payer: Self-pay

## 2019-06-29 ENCOUNTER — Telehealth: Payer: Self-pay | Admitting: Oncology

## 2019-06-29 ENCOUNTER — Inpatient Hospital Stay (HOSPITAL_BASED_OUTPATIENT_CLINIC_OR_DEPARTMENT_OTHER): Payer: BC Managed Care – PPO | Admitting: Oncology

## 2019-06-29 VITALS — BP 135/96 | HR 97 | Temp 98.0°F | Resp 18 | Ht 59.0 in | Wt 162.9 lb

## 2019-06-29 DIAGNOSIS — D47Z2 Castleman disease: Secondary | ICD-10-CM

## 2019-06-29 NOTE — Progress Notes (Signed)
[facility] OFFICE PROGRESS NOTE   Diagnosis: Castleman's disease  INTERVAL HISTORY:   [name] returns for scheduled visit.  She reports intermittent episodes of pain at the left "throat "and neck.  She was seen in the emergency room in November.  A CT was unremarkable.  She was evaluated by Dr. Redmond Baseman.  She was started on Prilosec and the symptoms have improved.  Good appetite and energy level.  No difficulty with bowel function.  She has intermittent "noises "in the abdomen, especially at night.  No dysphagia.  Objective:  Vital signs in last 24 hours:  Blood pressure (!) 135/96, pulse 97, temperature 98 F (36.7 C), temperature source Temporal, resp. rate 18, height 4\' 11"  (1.499 m), weight 162 lb 14.4 oz (73.9 kg), last menstrual period 06/16/2019, SpO2 100 %.    HEENT: Oropharynx without visible mass, neck without mass Lymphatics: No cervical, supraclavicular, axillary, or inguinal nodes Resp: Lungs clear bilaterally Cardio: Regular rate and rhythm GI: Nontender, no mass, no hepatosplenomegaly Vascular: No leg edema  Lab Results:  Lab Results  Component Value Date   WBC 7.8 11/14/2018   HGB 16.7 (H) 03/10/2019   HCT 49.0 (H) 03/10/2019   MCV 89 11/14/2018   PLT 312 11/14/2018   NEUTROABS 4.7 11/14/2018    CMP  Lab Results  Component Value Date   NA 141 06/22/2019   K 4.7 06/22/2019   CL 105 06/22/2019   CO2 29 06/22/2019   GLUCOSE 95 06/22/2019   BUN 16 06/22/2019   CREATININE 0.91 06/22/2019   CALCIUM 9.1 06/22/2019   PROT 7.1 06/22/2019   ALBUMIN 4.1 06/22/2019   AST 22 06/22/2019   ALT 24 06/22/2019   ALKPHOS 56 06/22/2019   BILITOT 0.4 06/22/2019   GFRNONAA >60 06/22/2019   GFRAA >60 06/22/2019     Medications: I have reviewed the patient's current medications.   Assessment/Plan: 1. Castleman's disease. A CT of the chest 06/09/2009 confirmed a right paratracheal mass. A mediastinoscopy 06/21/2009 with biopsy of the mediastinal  mass confirmed Castleman's disease. Staging CTs of the chest, abdomen and pelvis 08/12/2009 confirmed a right paratracheal mass, a superior mediastinal lymph node and a left cervical/supraclavicular lymph node. There was no evidence of Castleman's disease in the abdomen or pelvis. A CT on 11/07/2009 was stable.  Restaging CTs 07/10/2013 confirmed worsening of a single level 2 right cervical lymph node compared to a CT from 11/07/2009. No significant change in paratracheal lymphadenopathy.  Chest x-ray 02/24/2014-stable right paratracheal soft tissue mass  Chest x-ray 08/23/2014-stable right paratracheal soft tissue mass  CT 11/29/2014 with a slight increase in the right cervical lymph node compared to 07/10/2013 and a stable right paratracheal node  CT chest 07/11/2016 with interval stability of hypervascular right paratracheal lymphadenopathy. No new or progressive disease in the chest.  CT neck 07/11/2016-enlarging heterogeneous hyperdense mass lesion within the right carotid space. Approximate 10 mm bilateral level II hyperdense lymph nodes stable.  CT chest 05/29/2017-no change in the right paratracheal mass, no evidence of progressive disease  CT angiogram chest 07/01/2017-no pulmonary embolism, right paratracheal mass-minimally larger, measures 3.5 x 4.1 compared to 3.1 x 3.8 on 05/29/2017  CT angiogram chest 03/12/2018- no pulmonary embolism, slight decrease in size of right paratracheal mass  CT chest 06/19/2018- 2.9 cm right paratracheal nodal mass corresponding to patient's known Castleman's disease, mildly improved  CT chest 06/22/2019-unchanged right paratracheal mass and superior mediastinal node 2. G2 P2, status post delivery of her second child in  January 2015 3. Bilateral salpingectomy 06/05/2013 4. Right anterior cervical lymph node on exam 07/03/2013 5. CT neck 07/11/2016-enlarging heterogeneous hyperdense mass lesion within the right carotid space. Approximate 10 mm bilateral  level II hyperdense lymph nodes stable. CTangioneck 08/31/2016-large 3.9 cm hypervascular mass within the right carotid space;small but very similar contralateral left carotid space hypervascular lesion measuring 1.2 cm.  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      Disposition: [name] appears stable with regard to the Castleman's disease.  There is no clinical or radiologic evidence of disease progression.  She will follow-up with her primary provider or gastroenterology if the throat discomfort recurs.  She will return for an office visit in 6 months.  I encouraged her to obtain the COVID-19 vaccine.  She will see Dr. Ouida Sills for management of hypertension.  Betsy Coder, MD  06/29/2019  9:09 AM

## 2019-06-29 NOTE — Telephone Encounter (Signed)
Scheduled per los. Patient declined printout  

## 2019-12-23 IMAGING — XA IR ANGIO INTRA EXTRACRAN SEL COM CAROTID INNOMINATE BILAT MOD SE
2 series · 13 of 24 positions shown · IV contrast (IODINE)
Comparison: CT scan of the head and neck of 09/01/2016.

CLINICAL DATA: History of bilateral carotid body tumors status post
resection of the right side. Planned pre-surgical embolization of
the left carotid body tumor.

EXAM:
IR ANGIO EXTERNAL CAROTID SEL EXT CAROTID UNI LEFT MOD SED; IR ANGIO
VERTEBRAL SEL SUBCLAVIAN INNOMINATE UNI RIGHT MOD SED; BILATERAL
COMMON CAROTID AND INNOMINATE ANGIOGRAPHY
TECHNIQUE: Informed written consent was obtained from the patient after a
thorough discussion of the procedural risks, benefits and
alternatives. All questions were addressed. Maximal Sterile Barrier
Technique was utilized including caps, mask, sterile gowns, sterile
gloves, sterile drape, hand hygiene and skin antiseptic. A timeout
was performed prior to the initiation of the procedure.

[Series 7: cerebral · 1 of 2 slices shown]
[im 1/2]
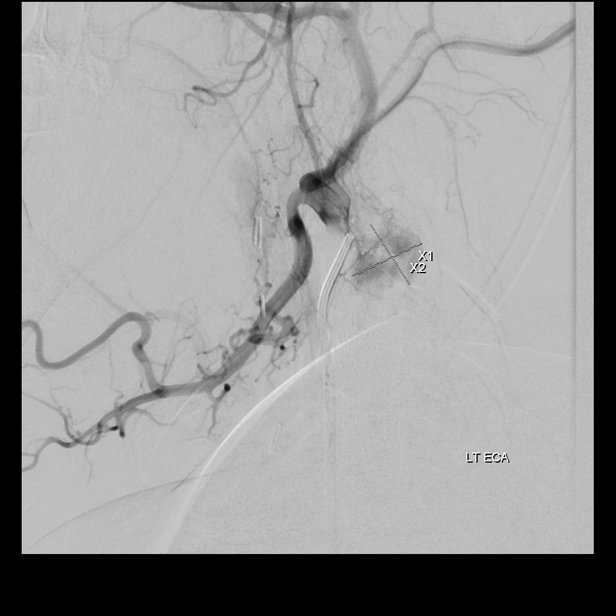

[Series 300: dr. (person_name) · 12 of 150 slices shown]
[im 7/150]
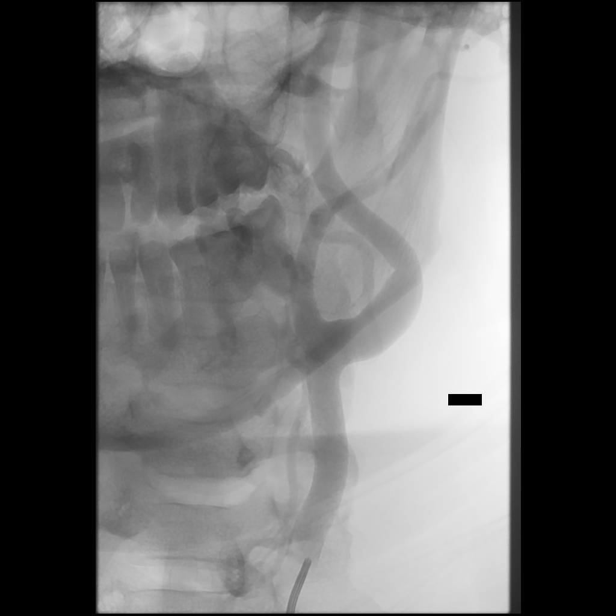
[im 21/150]
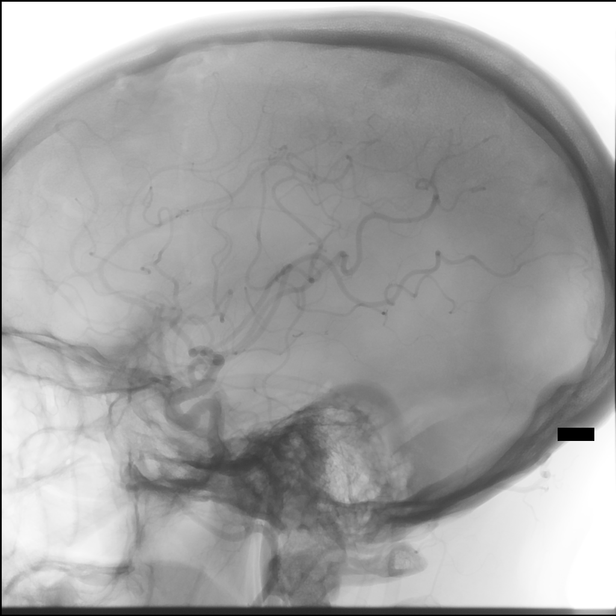
[im 34/150]
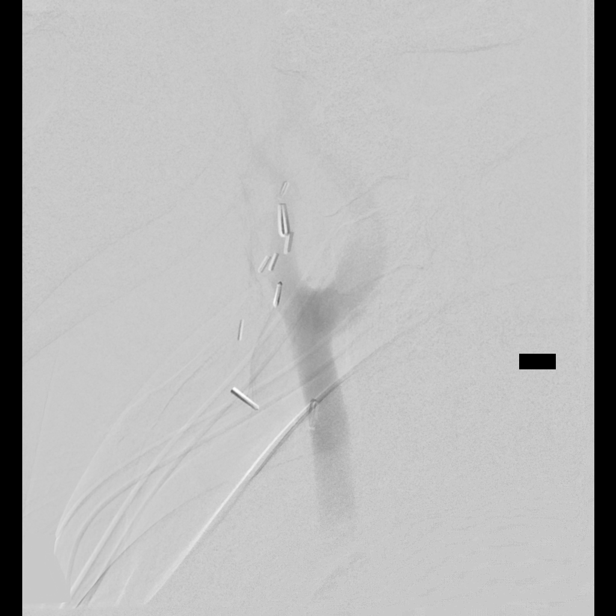
[im 48/150]
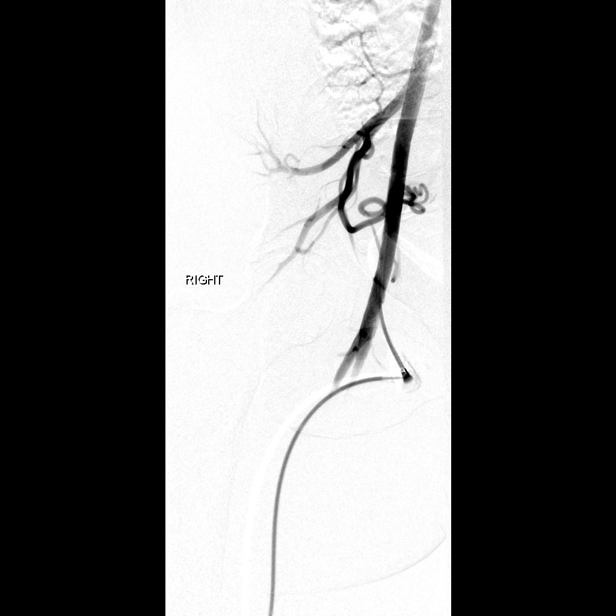
[im 61/150]
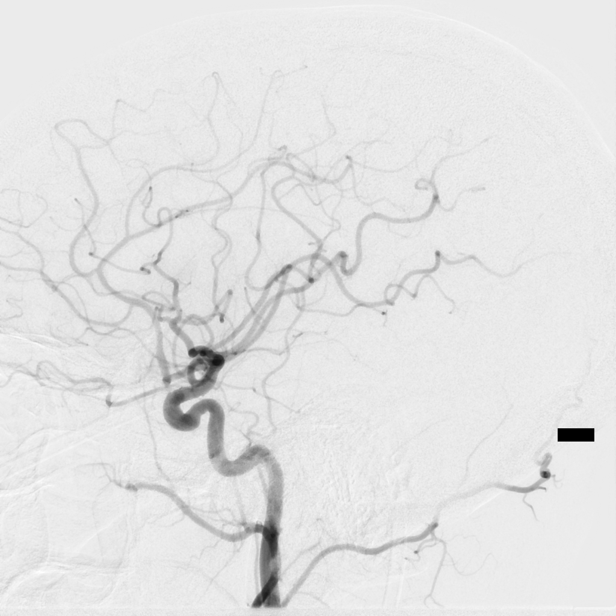
[im 75/150]
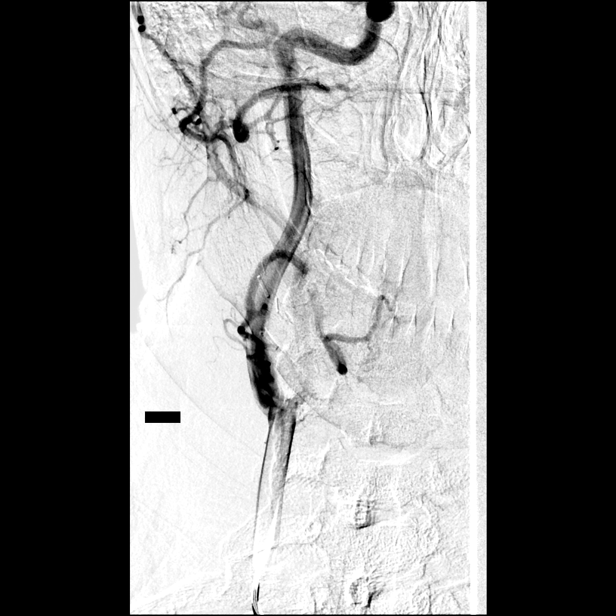
[im 82/150]
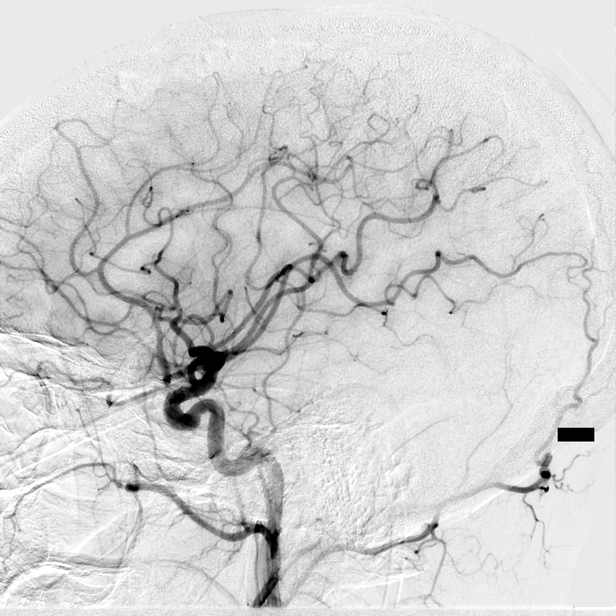
[im 95/150]
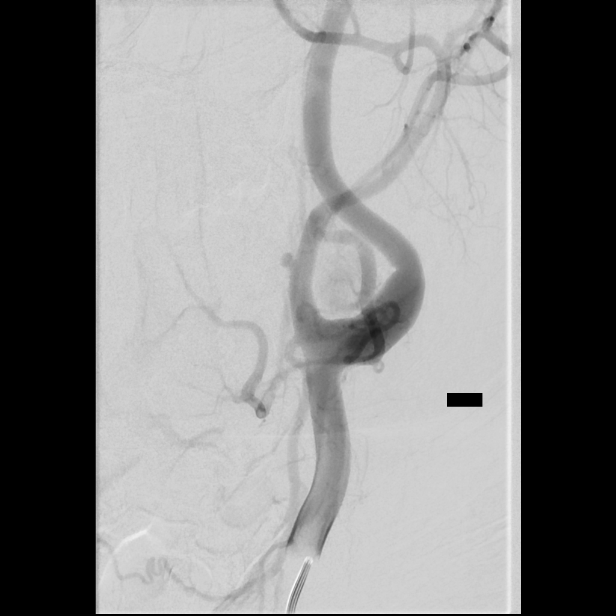
[im 109/150]
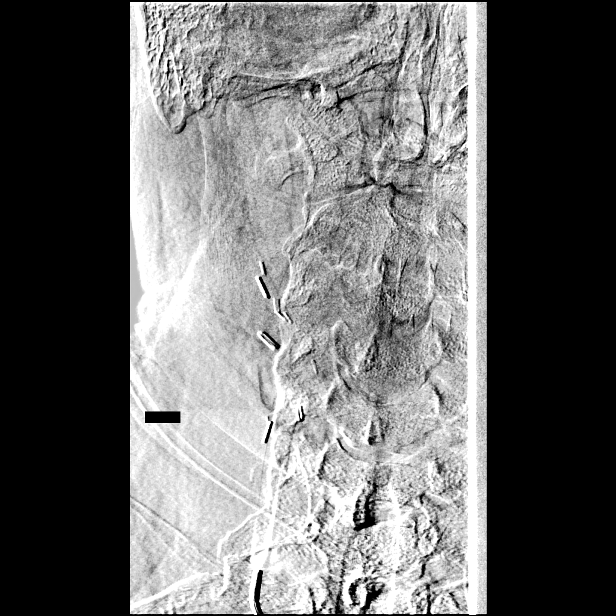
[im 122/150]
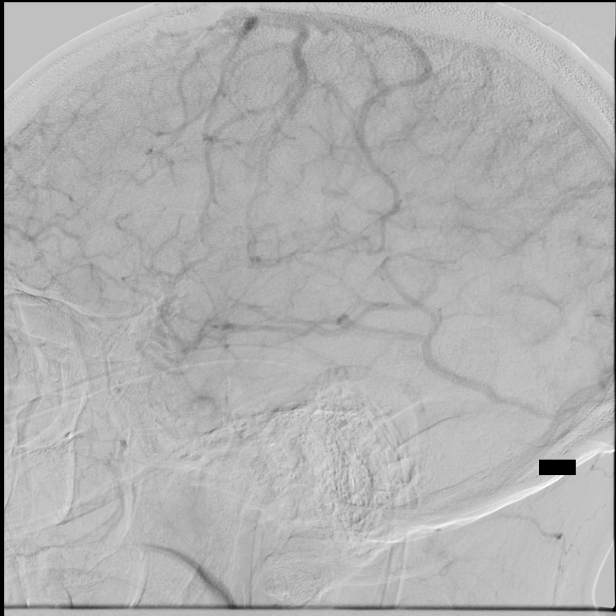
[im 136/150]
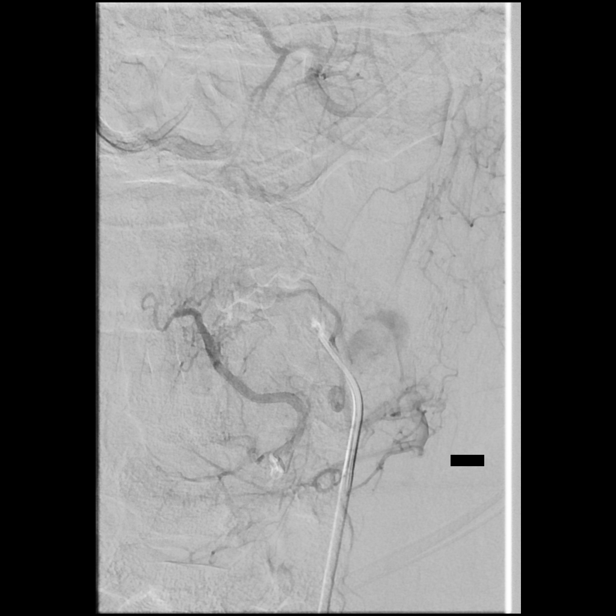
[im 150/150]
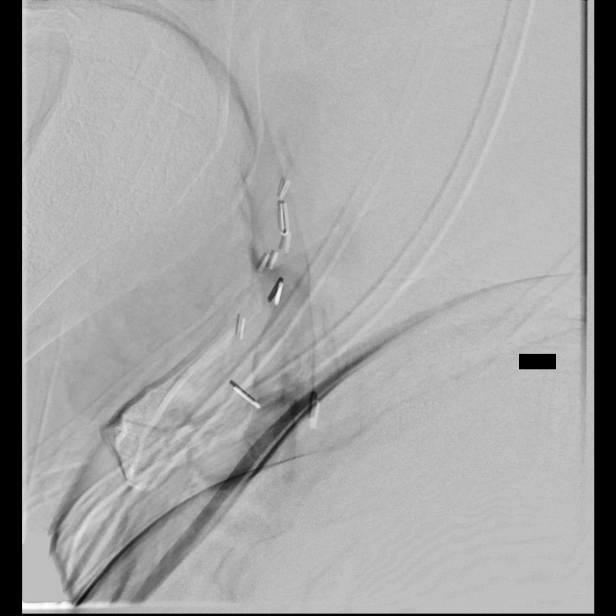

[13 of 24 positions shown; findings below may reference images not displayed]

MEDICATIONS:
Heparin 8777 units IV; no antibiotic was administered within 1 hour
of the procedure.

ANESTHESIA/SEDATION:
Mac anesthesia as per the [REDACTED] at [REDACTED].

CONTRAST:  Isovue 300 approximately 55 mL.

FLUOROSCOPY TIME:  Fluoroscopy Time: 7 minutes 12 seconds (647 mGy).

COMPLICATIONS:
None immediate.
The right groin was prepped and draped in the usual sterile fashion.
Thereafter using modified Seldinger technique, transfemoral access
into the right common femoral artery was obtained without
difficulty. Over a 0.035 inch guidewire, a 5 French Pinnacle sheath
was inserted. Through this, and also over 0.035 inch guidewire, a 5
French JB 1 catheter was advanced to the aortic arch region and
selectively positioned in the right common carotid artery, the left
common carotid artery and the left external carotid artery.
FINDINGS: The left common carotid arteriogram demonstrates the left external
carotid artery and its major branches to be widely patent.

The left internal carotid artery at the bulb to the cranial skull
base opacifies widely. The petrous, cavernous and supraclinoid
segments are widely patent.

The petrous, cavernous and supraclinoid segments are widely patent.

The left middle cerebral artery and the left anterior cerebral
artery opacify into the capillary and venous phases.

A selective left external carotid arteriogram demonstrates normal
opacification of the lingual artery, the facial artery, and internal
maxillary artery branches.

Mild prominence of the ascending pharyngeal is noted. Two hair thin
branches are seen emanating from the posterolateral aspect
projecting inferiorly and laterally given rise to near homogeneous
tumor blush measuring approximately 12 mm x 11 mm with 2 early
draining veins.

The right common carotid arteriogram demonstrates the right external
carotid artery and its major branches to be widely patent.

The right internal carotid artery at the bulb to the cranial skull
base opacifies normally.

The petrous, cavernous and supraclinoid segments demonstrate wide
patency.

The right middle cerebral artery and the right anterior cerebral
artery opacify normally into the capillary and venous phases.
IMPRESSION: Approximately 12 mm x 11 mm near homogeneous hypervascular tumor
blush with 2 small hair thin arterial feeders arising from the
ascending pharyngeal artery on the left side.

PLAN:
Findings were reviewed with the patient and her husband. They were
informed that the arterial feeders were too small in diameter for
safe embolization. Dr Talita informed of the above angiographic
findings.

Patient to be sent home and return for surgery the following
morning.

## 2019-12-30 ENCOUNTER — Encounter: Payer: Self-pay | Admitting: Nurse Practitioner

## 2019-12-30 ENCOUNTER — Inpatient Hospital Stay: Payer: BC Managed Care – PPO | Attending: Nurse Practitioner | Admitting: Nurse Practitioner

## 2019-12-30 ENCOUNTER — Other Ambulatory Visit: Payer: Self-pay

## 2019-12-30 VITALS — BP 134/94 | HR 98 | Temp 97.9°F | Resp 18 | Ht 59.0 in | Wt 159.7 lb

## 2019-12-30 DIAGNOSIS — R590 Localized enlarged lymph nodes: Secondary | ICD-10-CM | POA: Diagnosis not present

## 2019-12-30 DIAGNOSIS — Z86711 Personal history of pulmonary embolism: Secondary | ICD-10-CM | POA: Diagnosis not present

## 2019-12-30 DIAGNOSIS — Z79899 Other long term (current) drug therapy: Secondary | ICD-10-CM | POA: Insufficient documentation

## 2019-12-30 DIAGNOSIS — D47Z2 Castleman disease: Secondary | ICD-10-CM | POA: Insufficient documentation

## 2019-12-30 NOTE — Progress Notes (Signed)
[facility] OFFICE PROGRESS NOTE   Diagnosis: Castleman's disease  INTERVAL HISTORY:   [name] returns as scheduled.  She feels well.  No interim illnesses or infections.  She has a good appetite.  No fevers or sweats.  No enlarged lymph nodes.  She denies pain.  Objective:  Vital signs in last 24 hours:  Blood pressure (!) 134/94, pulse 98, temperature 97.9 F (36.6 C), temperature source Tympanic, resp. rate 18, height 4\' 11"  (1.499 m), weight 159 lb 11.2 oz (72.4 kg), SpO2 100 %.    HEENT: Neck without mass. Lymphatics: No palpable cervical, supraclavicular, axillary or inguinal lymph nodes. Resp: Lungs clear bilaterally. Cardio: Regular rate and rhythm. GI: Abdomen soft and nontender.  No hepatosplenomegaly. Vascular: No leg edema.    Lab Results:  Lab Results  Component Value Date   WBC 7.8 11/14/2018   HGB 16.7 (H) 03/10/2019   HCT 49.0 (H) 03/10/2019   MCV 89 11/14/2018   PLT 312 11/14/2018   NEUTROABS 4.7 11/14/2018    Imaging:  No results found.  Medications: I have reviewed the patient's current medications.  Assessment/Plan: 1. Castleman's disease. A CT of the chest 06/09/2009 confirmed a right paratracheal mass. A mediastinoscopy 06/21/2009 with biopsy of the mediastinal mass confirmed Castleman's disease. Staging CTs of the chest, abdomen and pelvis 08/12/2009 confirmed a right paratracheal mass, a superior mediastinal lymph node and a left cervical/supraclavicular lymph node. There was no evidence of Castleman's disease in the abdomen or pelvis. A CT on 11/07/2009 was stable.  Restaging CTs 07/10/2013 confirmed worsening of a single level 2 right cervical lymph node compared to a CT from 11/07/2009. No significant change in paratracheal lymphadenopathy.  Chest x-ray 02/24/2014-stable right paratracheal soft tissue mass  Chest x-ray 08/23/2014-stable right paratracheal soft tissue mass  CT 11/29/2014 with a slight increase in the  right cervical lymph node compared to 07/10/2013 and a stable right paratracheal node  CT chest 07/11/2016 with interval stability of hypervascular right paratracheal lymphadenopathy. No new or progressive disease in the chest.  CT neck 07/11/2016-enlarging heterogeneous hyperdense mass lesion within the right carotid space. Approximate 10 mm bilateral level II hyperdense lymph nodes stable.  CT chest 05/29/2017-no change in the right paratracheal mass, no evidence of progressive disease  CT angiogram chest 07/01/2017-no pulmonary embolism, right paratracheal mass-minimally larger, measures 3.5 x 4.1 compared to 3.1 x 3.8 on 05/29/2017  CT angiogram chest 03/12/2018-no pulmonary embolism, slight decrease in size of right paratracheal mass  CT chest 06/19/2018- 2.9 cm right paratracheal nodal mass corresponding to patient's known Castleman's disease, mildly improved  CT chest 06/22/2019-unchanged right paratracheal mass and superior mediastinal node 2. G2 P2, status post delivery of her second child in January 2015 3. Bilateral salpingectomy 06/05/2013 4. Right anterior cervical lymph node on exam 07/03/2013 5. CT neck 07/11/2016-enlarging heterogeneous hyperdense mass lesion within the right carotid space. Approximate 10 mm bilateral level II hyperdense lymph nodes stable. CTangioneck 08/31/2016-large 3.9 cm hypervascular mass within the right carotid space;small but very similar contralateral left carotid space hypervascular lesion measuring 1.2 cm.  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    Disposition: [name] appears stable.  There is no clinical evidence of progression of the Castleman's disease.  She will undergo a restaging chest CT in approximately 6 months.  We will  see her 1 to 2 days after the scan.  She will contact the office in the interim with any problems.    [name] ANP/GNP-BC   12/30/2019  3:35 PM

## 2019-12-31 ENCOUNTER — Telehealth: Payer: Self-pay | Admitting: Oncology

## 2019-12-31 NOTE — Telephone Encounter (Signed)
Scheduled appointments per 9/15 los. Spoke with patient who is aware of appointments dates and times.

## 2020-06-28 ENCOUNTER — Other Ambulatory Visit: Payer: Self-pay

## 2020-06-28 ENCOUNTER — Inpatient Hospital Stay: Payer: BC Managed Care – PPO | Attending: Oncology

## 2020-06-28 DIAGNOSIS — D47Z2 Castleman disease: Secondary | ICD-10-CM | POA: Diagnosis not present

## 2020-06-28 DIAGNOSIS — Z79899 Other long term (current) drug therapy: Secondary | ICD-10-CM | POA: Diagnosis not present

## 2020-06-28 DIAGNOSIS — Z86711 Personal history of pulmonary embolism: Secondary | ICD-10-CM | POA: Insufficient documentation

## 2020-06-28 DIAGNOSIS — R590 Localized enlarged lymph nodes: Secondary | ICD-10-CM

## 2020-06-28 LAB — BASIC METABOLIC PANEL - CANCER CENTER ONLY
Anion gap: 10 (ref 5–15)
BUN: 14 mg/dL (ref 6–20)
CO2: 27 mmol/L (ref 22–32)
Calcium: 9.3 mg/dL (ref 8.9–10.3)
Chloride: 102 mmol/L (ref 98–111)
Creatinine: 0.9 mg/dL (ref 0.44–1.00)
GFR, Estimated: >60 mL/min (ref >60)
Glucose, Bld: 90 mg/dL (ref 70–99)
Potassium: 4.2 mmol/L (ref 3.5–5.1)
Sodium: 139 mmol/L (ref 135–145)

## 2020-06-29 ENCOUNTER — Ambulatory Visit (HOSPITAL_COMMUNITY)
Admission: RE | Admit: 2020-06-29 | Discharge: 2020-06-29 | Disposition: A | Discharge status: Home / Self Care | Payer: BC Managed Care – PPO | Source: Ambulatory Visit | Attending: Nurse Practitioner | Admitting: Nurse Practitioner

## 2020-06-29 DIAGNOSIS — R590 Localized enlarged lymph nodes: Secondary | ICD-10-CM | POA: Diagnosis not present

## 2020-06-29 MED ORDER — IOHEXOL 300 MG/ML  SOLN
75.0000 mL | Freq: Once | INTRAMUSCULAR | Status: AC | PRN
  Administered 2020-06-29: 75 mL via INTRAVENOUS

## 2020-07-01 ENCOUNTER — Telehealth: Payer: Self-pay | Admitting: Oncology

## 2020-07-01 ENCOUNTER — Other Ambulatory Visit: Payer: Self-pay

## 2020-07-01 ENCOUNTER — Inpatient Hospital Stay (HOSPITAL_BASED_OUTPATIENT_CLINIC_OR_DEPARTMENT_OTHER): Payer: BC Managed Care – PPO | Admitting: Oncology

## 2020-07-01 VITALS — BP 134/88 | HR 100 | Temp 98.1°F | Resp 20 | Ht 59.0 in | Wt 157.3 lb

## 2020-07-01 DIAGNOSIS — R590 Localized enlarged lymph nodes: Secondary | ICD-10-CM | POA: Diagnosis not present

## 2020-07-01 DIAGNOSIS — D47Z2 Castleman disease: Secondary | ICD-10-CM | POA: Diagnosis not present

## 2020-07-01 NOTE — Progress Notes (Signed)
[facility] OFF[facility] PROGRESS NOTE   Diagnosis: Castleman's disease  INTERVAL HISTORY:   Tracy Downs returns as scheduled.  Tracy Downs feels well.  No fever or night sweats.  No palpable lymph nodes.  No dyspnea, cough, or chest pain.  Good appetite.  Tracy Downs reports intentional weight loss.  Tracy Downs has a burning sensation at the left neck surgical site.  This has been present since the time of surgery.  Tracy Downs obtains partial relief with Prilosec.  Tracy Downs has been evaluated by Dr. Trula Downs.  Objective:  Vital signs in last 24 hours:  Blood pressure 134/88, pulse 100, temperature 98.1 F (36.7 C), temperature source Tympanic, resp. rate 20, height 4\' 11"  (1.499 m), weight 157 lb 4.8 oz (71.4 kg), SpO2 100 %.    HEENT: Symmetric fullness over the thyroid, soft mobile oblong fullness overlying the cricoid cartilage, no discrete mass Lymphatics: No cervical, supraclavicular, axillary, or inguinal nodes Resp: Lungs clear bilaterally Cardio: Regular rate and rhythm GI: No mass, no hepatosplenomegaly, nontender Vascular: No leg edema    Lab Results:  Lab Results  Component Value Date   WBC 7.8 11/14/2018   HGB 16.7 (H) 03/10/2019   HCT 49.0 (H) 03/10/2019   MCV 89 11/14/2018   PLT 312 11/14/2018   NEUTROABS 4.7 11/14/2018    CMP  Lab Results  Component Value Date   NA 139 06/28/2020   K 4.2 06/28/2020   CL 102 06/28/2020   CO2 27 06/28/2020   GLUCOSE 90 06/28/2020   BUN 14 06/28/2020   CREATININE 0.90 06/28/2020   CALCIUM 9.3 06/28/2020   PROT 7.1 06/22/2019   ALBUMIN 4.1 06/22/2019   AST 22 06/22/2019   ALT 24 06/22/2019   ALKPHOS 56 06/22/2019   BILITOT 0.4 06/22/2019   GFRNONAA >60 06/28/2020   GFRAA >60 06/22/2019    Imaging:  CT Chest W Contrast  Result Date: 06/30/2020 CLINICAL DATA:  Lymphadenopathy, Castleman's disease EXAM: CT CHEST WITH CONTRAST TECHNIQUE: Multidetector CT imaging of the chest was performed during intravenous contrast administration.  CONTRAST:  81mL OMNIPAQUE IOHEXOL 300 MG/ML  SOLN COMPARISON:  06/22/2019, 06/19/2018 FINDINGS: Cardiovascular: No significant vascular findings. Normal heart size. No pericardial effusion. Mediastinum/Nodes: No significant change in an enlarged right paratracheal lymph node or soft tissue mass measuring 3.3 x 2.9 cm (series 2, image 44). No significant change in a right superior mediastinal lymph node adjacent to the origin of the innominate artery measuring 1.4 x 1.3 cm (series 2, image 33). Thyroid gland, trachea, and esophagus demonstrate no significant findings. Lungs/Pleura: Lungs are clear. No pleural effusion or pneumothorax. Upper Abdomen: No acute abnormality. Musculoskeletal: No chest wall mass or suspicious bone lesions identified. IMPRESSION: 1. No significant change in an enlarged right paratracheal lymph node or soft tissue mass measuring 3.3 x 2.9 cm. 2. No significant change in a right superior mediastinal lymph node adjacent to the origin of the innominate artery measuring 1.4 x 1.3 cm. 3. Findings are in keeping with known diagnosis of Castleman's disease. Electronically Signed   By: Tracy Downs M.D.   On: 06/30/2020 10:52    Medications: I have reviewed the patient's current medications.   Assessment/Plan: 1. Castleman's disease. A CT of the chest 06/09/2009 confirmed a right paratracheal mass. A mediastinoscopy 06/21/2009 with biopsy of the mediastinal mass confirmed Castleman's disease. Staging CTs of the chest, abdomen and pelvis 08/12/2009 confirmed a right paratracheal mass, a superior mediastinal lymph node and a left cervical/supraclavicular lymph node. There was no evidence of  Castleman's disease in the abdomen or pelvis. A CT on 11/07/2009 was stable.  Restaging CTs 07/10/2013 confirmed worsening of a single level 2 right cervical lymph node compared to a CT from 11/07/2009. No significant change in paratracheal lymphadenopathy.  Chest x-ray 02/24/2014-stable right  paratracheal soft tissue mass  Chest x-ray 08/23/2014-stable right paratracheal soft tissue mass  CT 11/29/2014 with a slight increase in the right cervical lymph node compared to 07/10/2013 and a stable right paratracheal node  CT chest 07/11/2016 with interval stability of hypervascular right paratracheal lymphadenopathy. No new or progressive disease in the chest.  CT neck 07/11/2016-enlarging heterogeneous hyperdense mass lesion within the right carotid space. Approximate 10 mm bilateral level II hyperdense lymph nodes stable.  CT chest 05/29/2017-no change in the right paratracheal mass, no evidence of progressive disease  CT angiogram chest 07/01/2017-no pulmonary embolism, right paratracheal mass-minimally larger, measures 3.5 x 4.1 compared to 3.1 x 3.8 on 05/29/2017  CT angiogram chest 03/12/2018-no pulmonary embolism, slight decrease in size of right paratracheal mass  CT chest 06/19/2018- 2.9 cm right paratracheal nodal mass corresponding to patient's known Castleman's disease, mildly improved  CT chest 06/22/2019-unchanged right paratracheal mass and superior mediastinal node  CT chest 06/29/2020-unchanged right paratracheal mass and superior mediastinal node 2. G2 P2, status post delivery of her second child in January 2015 3. Bilateral salpingectomy 06/05/2013 4. Right anterior cervical lymph node on exam 07/03/2013 5. CT neck 07/11/2016-enlarging heterogeneous hyperdense mass lesion within the right carotid space. Approximate 10 mm bilateral level II hyperdense lymph nodes stable. CTangioneck 08/31/2016-large 3.9 cm hypervascular mass within the right carotid space;small but very similar contralateral left carotid space hypervascular lesion measuring 1.2 cm.  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  12/05/2017 status post excision of left carotid body tumor with pathology showing  paraganglioma, one benign lymph node.     Disposition: Tracy Downs appears stable.  There is no clinical or radiologic evidence of disease progression.  The burning sensation surrounding the left neck scar may be related to nerve injury from surgery.  The soft mobile fullness overlying the thyroid cartilage is likely benign musculoskeletal tissue.  Tracy Downs will return for an office visit in 6 months.  We will plan for a restaging CT in 1 year.  Tracy Coder, MD  07/01/2020  8:30 AM

## 2020-07-01 NOTE — Telephone Encounter (Signed)
Scheduled per los. Gave avs and calendar  

## 2021-01-02 ENCOUNTER — Encounter: Payer: Self-pay | Admitting: Nurse Practitioner

## 2021-01-02 ENCOUNTER — Other Ambulatory Visit: Payer: Self-pay

## 2021-01-02 ENCOUNTER — Inpatient Hospital Stay: Payer: BC Managed Care – PPO | Attending: Nurse Practitioner | Admitting: Nurse Practitioner

## 2021-01-02 VITALS — BP 131/92 | HR 95 | Temp 98.1°F | Resp 20 | Ht 59.0 in | Wt 158.2 lb

## 2021-01-02 DIAGNOSIS — Z86711 Personal history of pulmonary embolism: Secondary | ICD-10-CM | POA: Insufficient documentation

## 2021-01-02 DIAGNOSIS — D47Z2 Castleman disease: Secondary | ICD-10-CM | POA: Diagnosis present

## 2021-01-02 NOTE — Progress Notes (Signed)
[facility] OFFICE PROGRESS NOTE   Diagnosis: Castleman's disease  INTERVAL HISTORY:   [name] returns as scheduled.  She feels well.  No fevers or sweats.  No anorexia or weight loss.  She is not aware of any enlarged lymph nodes.  She denies cough and shortness of breath.  Objective:  Vital signs in last 24 hours:  Blood pressure (!) 131/92, pulse 95, temperature 98.1 F (36.7 C), temperature source Oral, resp. rate 20, height 4\' 11"  (1.499 m), weight 158 lb 3.2 oz (71.8 kg), SpO2 98 %.    HEENT: Symmetric fullness over the thyroid. Lymphatics: No palpable cervical, supraclavicular, axillary or inguinal lymph nodes. Resp: Lungs clear bilaterally. Cardio: Regular rate and rhythm. GI: Abdomen soft and nontender.  No hepatosplenomegaly.  No mass. Vascular: No leg edema.   Lab Results:  Lab Results  Component Value Date   WBC 7.8 11/14/2018   HGB 16.7 (H) 03/10/2019   HCT 49.0 (H) 03/10/2019   MCV 89 11/14/2018   PLT 312 11/14/2018   NEUTROABS 4.7 11/14/2018    Imaging:  No results found.  Medications: I have reviewed the patient's current medications.  Assessment/Plan: Castleman's disease. A CT of the chest 06/09/2009 confirmed a right paratracheal mass. A mediastinoscopy 06/21/2009 with biopsy of the mediastinal mass confirmed Castleman's disease. Staging CTs of the chest, abdomen and pelvis 08/12/2009 confirmed a right paratracheal mass, a superior mediastinal lymph node and a left cervical/supraclavicular lymph node. There was no evidence of Castleman's disease in the abdomen or pelvis. A CT on 11/07/2009 was stable. Restaging CTs 07/10/2013 confirmed worsening of a single level 2 right cervical lymph node compared to a CT from 11/07/2009. No significant change in paratracheal lymphadenopathy. Chest x-ray 02/24/2014-stable right paratracheal soft tissue mass Chest x-ray 08/23/2014-stable right paratracheal soft tissue mass CT 11/29/2014 with a  slight increase in the right cervical lymph node compared to 07/10/2013 and a stable right paratracheal node CT chest 07/11/2016 with interval stability of hypervascular right paratracheal lymphadenopathy. No new or progressive disease in the chest. CT neck 07/11/2016-enlarging heterogeneous hyperdense mass lesion within the right carotid space. Approximate 10 mm bilateral level II hyperdense lymph nodes stable. CT chest 05/29/2017-no change in the right paratracheal mass, no evidence of progressive disease CT angiogram chest 07/01/2017-no pulmonary embolism, right paratracheal mass-minimally larger, measures 3.5 x 4.1 compared to 3.1 x 3.8 on 05/29/2017 CT angiogram chest 03/12/2018- no pulmonary embolism, slight decrease in size of right paratracheal mass CT chest 06/19/2018- 2.9 cm right paratracheal nodal mass corresponding to patient's known Castleman's disease, mildly improved CT chest 06/22/2019-unchanged right paratracheal mass and superior mediastinal node CT chest 06/29/2020-unchanged right paratracheal mass and superior mediastinal node G2 P2, status post delivery of her second child in January 2015 Bilateral salpingectomy 06/05/2013 Right anterior cervical lymph node on exam 07/03/2013 CT neck 07/11/2016-enlarging heterogeneous hyperdense mass lesion within the right carotid space. Approximate 10 mm bilateral level II hyperdense lymph nodes stable. CT angio neck 08/31/2016-large 3.9 cm hypervascular mass within the right carotid space; small but very similar contralateral left carotid space hypervascular lesion measuring 1.2 cm.  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 Benign reactive cervical lymph node. 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      Disposition: [name] appears well.  There is no clinical evidence of disease progression.  Plan for restaging  chest CT in 6 months.  We  will see her in follow-up a few days later to review the results.    [name] ANP/GNP-BC   01/02/2021  8:22 AM

## 2021-01-24 ENCOUNTER — Other Ambulatory Visit: Payer: Self-pay | Admitting: Obstetrics and Gynecology

## 2021-01-24 DIAGNOSIS — Z1231 Encounter for screening mammogram for malignant neoplasm of breast: Secondary | ICD-10-CM

## 2021-02-21 ENCOUNTER — Ambulatory Visit
Admission: RE | Admit: 2021-02-21 | Discharge: 2021-02-21 | Disposition: A | Discharge status: Home / Self Care | Payer: BC Managed Care – PPO | Source: Ambulatory Visit | Attending: Obstetrics and Gynecology | Admitting: Obstetrics and Gynecology

## 2021-02-21 ENCOUNTER — Other Ambulatory Visit: Payer: Self-pay

## 2021-02-21 DIAGNOSIS — Z1231 Encounter for screening mammogram for malignant neoplasm of breast: Secondary | ICD-10-CM

## 2021-06-09 ENCOUNTER — Telehealth: Payer: Self-pay | Admitting: *Deleted

## 2021-06-09 DIAGNOSIS — D47Z2 Castleman disease: Secondary | ICD-10-CM

## 2021-06-09 DIAGNOSIS — R590 Localized enlarged lymph nodes: Secondary | ICD-10-CM

## 2021-06-09 NOTE — Telephone Encounter (Signed)
Called patient to give her CT chest appointment for 07/03/21 and she is requesting to add CT neck to this procedure. Reports she found ~ 1 week ago a pea sized node in left neck area. Not tender and she denies any recent infection. No other areas of swelling noted. Informed her that message will be given to provider and she will be called back on Monday.

## 2021-06-09 NOTE — Telephone Encounter (Signed)
Per Dr. Benay Spice: OK to add CT neck to chest on 3/20. No urgency. Patient notified of scan times and prep.

## 2021-06-09 NOTE — Addendum Note (Signed)
Addended by: Tania Ade on: 06/09/2021 03:33 PM   Modules accepted: Orders

## 2021-07-03 ENCOUNTER — Inpatient Hospital Stay: Payer: BC Managed Care – PPO | Attending: Oncology

## 2021-07-03 ENCOUNTER — Ambulatory Visit (HOSPITAL_BASED_OUTPATIENT_CLINIC_OR_DEPARTMENT_OTHER)
Admission: RE | Admit: 2021-07-03 | Discharge: 2021-07-03 | Disposition: A | Discharge status: Home / Self Care | Payer: BC Managed Care – PPO | Source: Ambulatory Visit | Attending: Nurse Practitioner | Admitting: Nurse Practitioner

## 2021-07-03 ENCOUNTER — Other Ambulatory Visit: Payer: Self-pay

## 2021-07-03 DIAGNOSIS — D446 Neoplasm of uncertain behavior of carotid body: Secondary | ICD-10-CM | POA: Insufficient documentation

## 2021-07-03 DIAGNOSIS — R590 Localized enlarged lymph nodes: Secondary | ICD-10-CM

## 2021-07-03 DIAGNOSIS — D47Z2 Castleman disease: Secondary | ICD-10-CM | POA: Insufficient documentation

## 2021-07-03 DIAGNOSIS — Z79899 Other long term (current) drug therapy: Secondary | ICD-10-CM | POA: Insufficient documentation

## 2021-07-03 DIAGNOSIS — D447 Neoplasm of uncertain behavior of aortic body and other paraganglia: Secondary | ICD-10-CM | POA: Insufficient documentation

## 2021-07-03 DIAGNOSIS — Z86711 Personal history of pulmonary embolism: Secondary | ICD-10-CM | POA: Insufficient documentation

## 2021-07-03 DIAGNOSIS — K449 Diaphragmatic hernia without obstruction or gangrene: Secondary | ICD-10-CM | POA: Insufficient documentation

## 2021-07-03 LAB — BASIC METABOLIC PANEL - CANCER CENTER ONLY
Anion gap: 7 (ref 5–15)
BUN: 15 mg/dL (ref 6–20)
CO2: 29 mmol/L (ref 22–32)
Calcium: 9.5 mg/dL (ref 8.9–10.3)
Chloride: 102 mmol/L (ref 98–111)
Creatinine: 0.84 mg/dL (ref 0.44–1.00)
GFR, Estimated: >60 mL/min (ref >60)
Glucose, Bld: 87 mg/dL (ref 70–99)
Potassium: 4.3 mmol/L (ref 3.5–5.1)
Sodium: 138 mmol/L (ref 135–145)

## 2021-07-03 LAB — POCT I-STAT CREATININE: Creatinine, Ser: 0.9 mg/dL (ref 0.44–1.00)

## 2021-07-03 MED ORDER — IOHEXOL 300 MG/ML  SOLN
100.0000 mL | Freq: Once | INTRAMUSCULAR | Status: AC | PRN
  Administered 2021-07-03: 75 mL via INTRAVENOUS

## 2021-07-05 ENCOUNTER — Inpatient Hospital Stay: Payer: BC Managed Care – PPO | Admitting: Oncology

## 2021-07-05 ENCOUNTER — Other Ambulatory Visit: Payer: Self-pay

## 2021-07-05 VITALS — BP 124/94 | HR 97 | Temp 97.8°F | Resp 20 | Ht 59.0 in | Wt 160.4 lb

## 2021-07-05 DIAGNOSIS — D47Z2 Castleman disease: Secondary | ICD-10-CM

## 2021-07-05 DIAGNOSIS — Z79899 Other long term (current) drug therapy: Secondary | ICD-10-CM | POA: Diagnosis not present

## 2021-07-05 DIAGNOSIS — K449 Diaphragmatic hernia without obstruction or gangrene: Secondary | ICD-10-CM | POA: Diagnosis not present

## 2021-07-05 DIAGNOSIS — D446 Neoplasm of uncertain behavior of carotid body: Secondary | ICD-10-CM | POA: Diagnosis not present

## 2021-07-05 DIAGNOSIS — D447 Neoplasm of uncertain behavior of aortic body and other paraganglia: Secondary | ICD-10-CM | POA: Diagnosis not present

## 2021-07-05 DIAGNOSIS — Z86711 Personal history of pulmonary embolism: Secondary | ICD-10-CM | POA: Diagnosis not present

## 2021-07-05 NOTE — Progress Notes (Signed)
?[place] ?OFFICE PROGRESS NOTE ? ? ?Diagnosis: Castleman's disease, paraganglioma ? ?INTERVAL HISTORY:  ? ?Tracy Downs returns for scheduled visit.  She feels well.  No fever or night sweats.  No cough or dyspnea.  Good appetite and energy level.  She has noted a fullness at the left submandibular region for the past month.  No associated pain. ? ?Objective: ? ?Vital signs in last 24 hours: ? ?Blood pressure (!) 124/94, pulse 97, temperature 97.8 ?F (36.6 ?C), temperature source Oral, resp. rate 20, height '4\' 11"'$  (1.499 m), weight 160 lb 6.4 oz (72.8 kg), SpO2 100 %. ?  ? ?HEENT: Oropharynx without visible mass.  No neck mass.  There is a linear firmness/fullness in the left neck posterior to the submandibular gland and superior to the left neck scar.  No discrete mass. ?Lymphatics: No cervical, supraclavicular, axillary, or inguinal nodes ?Resp: Lungs clear bilaterally ?Cardio: Regular rate and rhythm ?GI: No hepatosplenomegaly ?Vascular: No leg edema ?  ?Lab Results: ? ?Lab Results  ?Component Value Date  ? WBC 7.8 11/14/2018  ? HGB 16.7 (H) 03/10/2019  ? HCT 49.0 (H) 03/10/2019  ? MCV 89 11/14/2018  ? PLT 312 11/14/2018  ? NEUTROABS 4.7 11/14/2018  ? ? ?CMP  ?Lab Results  ?Component Value Date  ? NA 138 07/03/2021  ? K 4.3 07/03/2021  ? CL 102 07/03/2021  ? CO2 29 07/03/2021  ? GLUCOSE 87 07/03/2021  ? BUN 15 07/03/2021  ? CREATININE 0.90 07/03/2021  ? CALCIUM 9.5 07/03/2021  ? PROT 7.1 06/22/2019  ? ALBUMIN 4.1 06/22/2019  ? AST 22 06/22/2019  ? ALT 24 06/22/2019  ? ALKPHOS 56 06/22/2019  ? BILITOT 0.4 06/22/2019  ? GFRNONAA >60 07/03/2021  ? GFRAA >60 06/22/2019  ? ? ?Imaging: ? ?CT Soft Tissue Neck W Contrast ? ?Result Date: 07/04/2021 ?CLINICAL DATA:  Neck mass, nonpulsatile Enlarged lymph node left neck with H/O paraganglioma left neck EXAM: CT NECK WITH CONTRAST TECHNIQUE: Multidetector CT imaging of the neck was performed using the standard protocol following the bolus administration of  intravenous contrast. RADIATION DOSE REDUCTION: This exam was performed according to the departmental dose-optimization program which includes automated exposure control, adjustment of the mA and/or kV according to patient size and/or use of iterative reconstruction technique. CONTRAST:  11m OMNIPAQUE IOHEXOL 300 MG/ML  SOLN COMPARISON:  CT neck March 10, 2019. FINDINGS: Pharynx and larynx: Normal. No mass or swelling. Closed glottis, which limits evaluation of the larynx. Salivary glands: No inflammation, mass, or stone. Thyroid: Normal. Lymph nodes: None enlarged nodes in the neck. Vascular: Limited evaluation due to non arterial timing. Major arteries appear grossly patent in the neck. Postoperative changes of prior carotid body tumor resection without evidence of recurrence. Limited intracranial: Negative. Visualized orbits: Negative. Mastoids and visualized paranasal sinuses: Small retention cyst in left maxillary sinus. Otherwise, clear sinuses. No mastoid effusions. Skeleton: No acute abnormality. Chronic reversal of normal cervical lordosis. Upper chest: Visualized lung apices are clear. Right paratracheal adenopathy better characterized on concurrent CT chest. IMPRESSION: 1. Postoperative changes of carotid body tumor resection without evidence of recurrence. 2. Right paratracheal adenopathy better characterized on concurrent CT chest. Otherwise, no evidence of adenopathy in the neck. Electronically Signed   By: FMargaretha SheffieldM.D.   On: 07/04/2021 13:32  ? ?CT Chest W Contrast ? ?Result Date: 07/03/2021 ?CLINICAL DATA:  41year old female with history of Castleman's disease. Follow-up study. EXAM: CT CHEST WITH CONTRAST TECHNIQUE: Multidetector CT imaging of the chest  was performed during intravenous contrast administration. RADIATION DOSE REDUCTION: This exam was performed according to the departmental dose-optimization program which includes automated exposure control, adjustment of the mA and/or  kV according to patient size and/or use of iterative reconstruction technique. CONTRAST:  21m OMNIPAQUE IOHEXOL 300 MG/ML  SOLN COMPARISON:  Chest CT 06/29/2020. FINDINGS: Cardiovascular: Heart size is normal. There is no significant pericardial fluid, thickening or pericardial calcification. No atherosclerotic calcifications are noted in the thoracic aorta or the coronary arteries. Mediastinum/Nodes: Markedly enlarged right paratracheal lymph node again noted, currently measuring 2.8 cm in short axis, unchanged. No other pathologically enlarged mediastinal or hilar lymph nodes are noted on today's examination. Small hiatal hernia. Lungs/Pleura: No acute consolidative airspace disease. No pleural effusions. No suspicious appearing pulmonary nodules or masses are noted. Upper Abdomen: Unremarkable. Musculoskeletal: There are no aggressive appearing lytic or blastic lesions noted in the visualized portions of the skeleton. IMPRESSION: 1. Right paratracheal lymphadenopathy, stable compared to the prior study, measuring 2.8 cm in short axis. No new lymphadenopathy otherwise noted. Electronically Signed   By: DVinnie LangtonM.D.   On: 07/03/2021 09:07   ? ?Medications: I have reviewed the patient's current medications. ? ? ?Assessment/Plan: ?Castleman's disease. A CT of the chest 06/09/2009 confirmed a right paratracheal mass. A mediastinoscopy 06/21/2009 with biopsy of the mediastinal mass confirmed Castleman's disease. Staging CTs of the chest, abdomen and pelvis 08/12/2009 confirmed a right paratracheal mass, a superior mediastinal lymph node and a left cervical/supraclavicular lymph node. There was no evidence of Castleman's disease in the abdomen or pelvis. A CT on 11/07/2009 was stable. ?Restaging CTs 07/10/2013 confirmed worsening of a single level 2 right cervical lymph node compared to a CT from 11/07/2009. No significant change in paratracheal lymphadenopathy. ?Chest x-ray 02/24/2014-stable right  paratracheal soft tissue mass ?Chest x-ray 08/23/2014-stable right paratracheal soft tissue mass ?CT 11/29/2014 with a slight increase in the right cervical lymph node compared to 07/10/2013 and a stable right paratracheal node ?CT chest 07/11/2016 with interval stability of hypervascular right paratracheal lymphadenopathy. No new or progressive disease in the chest. ?CT neck 07/11/2016-enlarging heterogeneous hyperdense mass lesion within the right carotid space. Approximate 10 mm bilateral level II hyperdense lymph nodes stable. ?CT chest 05/29/2017-no change in the right paratracheal mass, no evidence of progressive disease ?CT angiogram chest 07/01/2017-no pulmonary embolism, right paratracheal mass-minimally larger, measures 3.5 x 4.1 compared to 3.1 x 3.8 on 05/29/2017 ?CT angiogram chest 03/12/2018- no pulmonary embolism, slight decrease in size of right paratracheal mass ?CT chest 06/19/2018- 2.9 cm right paratracheal nodal mass corresponding to patient's known Castleman's disease, mildly improved ?CT chest 06/22/2019-unchanged right paratracheal mass and superior mediastinal node ?CT chest 06/29/2020-unchanged right paratracheal mass and superior mediastinal node ?CT chest 07/03/2021-stable right paratracheal mass, no new lymphadenopathy ?G2 P2, status post delivery of her second child in January 2015 ?Bilateral salpingectomy 06/05/2013 ?Right anterior cervical lymph node on exam 07/03/2013 ?CT neck 07/11/2016-enlarging heterogeneous hyperdense mass lesion within the right carotid space. Approximate 10 mm bilateral level II hyperdense lymph nodes stable. CT angio neck 08/31/2016-large 3.9 cm hypervascular mass within the right carotid space; small but very similar contralateral left carotid space hypervascular lesion measuring 1.2 cm.  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 Benign reactive cervical lymph node. ?12/05/2017 status post excision of left carotid  body tumor with pathology showing paraganglioma, one benign lymph node. ?  ?  ? ? ?Disposition: ?Ms. VAvellinoremains in clinical remission from the paragangliomas.  No clinical evidence for progression of the Cas

## 2022-01-05 ENCOUNTER — Encounter: Payer: Self-pay | Admitting: Nurse Practitioner

## 2022-01-05 ENCOUNTER — Inpatient Hospital Stay: Payer: BC Managed Care – PPO | Attending: Nurse Practitioner | Admitting: Nurse Practitioner

## 2022-01-05 VITALS — BP 116/90 | HR 92 | Temp 97.9°F | Resp 18 | Ht 59.0 in | Wt 160.0 lb

## 2022-01-05 DIAGNOSIS — D447 Neoplasm of uncertain behavior of aortic body and other paraganglia: Secondary | ICD-10-CM | POA: Insufficient documentation

## 2022-01-05 DIAGNOSIS — D47Z2 Castleman disease: Secondary | ICD-10-CM | POA: Diagnosis present

## 2022-01-05 NOTE — Progress Notes (Signed)
[place] OFFICE PROGRESS NOTE   Diagnosis: Castleman's disease, paraganglioma  INTERVAL HISTORY:   Tracy Downs returns as scheduled.  She feels well.  No fevers or sweats.  Good appetite.  Weight is stable.  No changes over the neck.  No dysphagia.  Objective:  Vital signs in last 24 hours:  Blood pressure (!) 116/90, pulse 92, temperature 97.9 F (36.6 C), resp. rate 18, height '4\' 11"'$  (1.499 m), weight 160 lb (72.6 kg), SpO2 100 %.    HEENT: No thrush or ulcers. Lymphatics: No palpable cervical, supraclavicular, axillary or inguinal lymph nodes. Resp: Lungs clear bilaterally. Cardio: Regular rate and rhythm. GI: Abdomen soft and nontender.  No hepatosplenomegaly. Vascular: No leg edema.  Lab Results:  Lab Results  Component Value Date   WBC 7.8 11/14/2018   HGB 16.7 (H) 03/10/2019   HCT 49.0 (H) 03/10/2019   MCV 89 11/14/2018   PLT 312 11/14/2018   NEUTROABS 4.7 11/14/2018    Imaging:  No results found.  Medications: I have reviewed the patient's current medications.  Assessment/Plan: Castleman's disease. A CT of the chest 06/09/2009 confirmed a right paratracheal mass. A mediastinoscopy 06/21/2009 with biopsy of the mediastinal mass confirmed Castleman's disease. Staging CTs of the chest, abdomen and pelvis 08/12/2009 confirmed a right paratracheal mass, a superior mediastinal lymph node and a left cervical/supraclavicular lymph node. There was no evidence of Castleman's disease in the abdomen or pelvis. A CT on 11/07/2009 was stable. Restaging CTs 07/10/2013 confirmed worsening of a single level 2 right cervical lymph node compared to a CT from 11/07/2009. No significant change in paratracheal lymphadenopathy. Chest x-ray 02/24/2014-stable right paratracheal soft tissue mass Chest x-ray 08/23/2014-stable right paratracheal soft tissue mass CT 11/29/2014 with a slight increase in the right cervical lymph node compared to 07/10/2013 and a stable  right paratracheal node CT chest 07/11/2016 with interval stability of hypervascular right paratracheal lymphadenopathy. No new or progressive disease in the chest. CT neck 07/11/2016-enlarging heterogeneous hyperdense mass lesion within the right carotid space. Approximate 10 mm bilateral level II hyperdense lymph nodes stable. CT chest 05/29/2017-no change in the right paratracheal mass, no evidence of progressive disease CT angiogram chest 07/01/2017-no pulmonary embolism, right paratracheal mass-minimally larger, measures 3.5 x 4.1 compared to 3.1 x 3.8 on 05/29/2017 CT angiogram chest 03/12/2018- no pulmonary embolism, slight decrease in size of right paratracheal mass CT chest 06/19/2018- 2.9 cm right paratracheal nodal mass corresponding to patient's known Castleman's disease, mildly improved CT chest 06/22/2019-unchanged right paratracheal mass and superior mediastinal node CT chest 06/29/2020-unchanged right paratracheal mass and superior mediastinal node CT chest 07/03/2021-stable right paratracheal mass, no new lymphadenopathy G2 P2, status post delivery of her second child in January 2015 Bilateral salpingectomy 06/05/2013 Right anterior cervical lymph node on exam 07/03/2013 CT neck 07/11/2016-enlarging heterogeneous hyperdense mass lesion within the right carotid space. Approximate 10 mm bilateral level II hyperdense lymph nodes stable. CT angio neck 08/31/2016-large 3.9 cm hypervascular mass within the right carotid space; small but very similar contralateral left carotid space hypervascular lesion measuring 1.2 cm.  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 Benign reactive cervical lymph node. 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      Disposition: Tracy Downs remains in clinical remission from the paraganglioma.  There is no clinical evidence for  progression of the Castleman's disease.  Plan for CTs neck and chest in 6 months.  She will return for follow-up in 6 months, a few days after scans.  She will contact the office in the interim with any problems.    [name] ANP/GNP-BC   01/05/2022  8:36 AM

## 2022-01-12 ENCOUNTER — Other Ambulatory Visit: Payer: Self-pay | Admitting: Obstetrics and Gynecology

## 2022-01-12 DIAGNOSIS — Z1231 Encounter for screening mammogram for malignant neoplasm of breast: Secondary | ICD-10-CM

## 2022-02-23 ENCOUNTER — Ambulatory Visit
Admission: RE | Admit: 2022-02-23 | Discharge: 2022-02-23 | Disposition: A | Discharge status: Home / Self Care | Payer: BC Managed Care – PPO | Source: Ambulatory Visit | Attending: Obstetrics and Gynecology | Admitting: Obstetrics and Gynecology

## 2022-02-23 DIAGNOSIS — Z1231 Encounter for screening mammogram for malignant neoplasm of breast: Secondary | ICD-10-CM

## 2022-07-06 ENCOUNTER — Ambulatory Visit (HOSPITAL_BASED_OUTPATIENT_CLINIC_OR_DEPARTMENT_OTHER): Payer: BC Managed Care – PPO

## 2022-07-06 ENCOUNTER — Ambulatory Visit (HOSPITAL_BASED_OUTPATIENT_CLINIC_OR_DEPARTMENT_OTHER)
Admission: RE | Admit: 2022-07-06 | Discharge: 2022-07-06 | Disposition: A | Discharge status: Home / Self Care | Payer: BC Managed Care – PPO | Source: Ambulatory Visit | Attending: Nurse Practitioner | Admitting: Nurse Practitioner

## 2022-07-06 ENCOUNTER — Inpatient Hospital Stay: Payer: BC Managed Care – PPO | Attending: Oncology

## 2022-07-06 DIAGNOSIS — Z79899 Other long term (current) drug therapy: Secondary | ICD-10-CM | POA: Insufficient documentation

## 2022-07-06 DIAGNOSIS — D47Z2 Castleman disease: Secondary | ICD-10-CM | POA: Insufficient documentation

## 2022-07-06 DIAGNOSIS — D447 Neoplasm of uncertain behavior of aortic body and other paraganglia: Secondary | ICD-10-CM | POA: Insufficient documentation

## 2022-07-06 DIAGNOSIS — Z86711 Personal history of pulmonary embolism: Secondary | ICD-10-CM | POA: Insufficient documentation

## 2022-07-06 DIAGNOSIS — D446 Neoplasm of uncertain behavior of carotid body: Secondary | ICD-10-CM | POA: Insufficient documentation

## 2022-07-06 LAB — BASIC METABOLIC PANEL - CANCER CENTER ONLY
Anion gap: 7 (ref 5–15)
BUN: 11 mg/dL (ref 6–20)
CO2: 28 mmol/L (ref 22–32)
Calcium: 9.5 mg/dL (ref 8.9–10.3)
Chloride: 102 mmol/L (ref 98–111)
Creatinine: 0.91 mg/dL (ref 0.44–1.00)
GFR, Estimated: >60 mL/min (ref >60)
Glucose, Bld: 97 mg/dL (ref 70–99)
Potassium: 4.7 mmol/L (ref 3.5–5.1)
Sodium: 137 mmol/L (ref 135–145)

## 2022-07-06 MED ORDER — IOHEXOL 300 MG/ML  SOLN
100.0000 mL | Freq: Once | INTRAMUSCULAR | Status: AC | PRN
  Administered 2022-07-06: 75 mL via INTRAVENOUS

## 2022-07-11 ENCOUNTER — Inpatient Hospital Stay: Payer: BC Managed Care – PPO | Admitting: Oncology

## 2022-07-11 ENCOUNTER — Encounter: Payer: Self-pay | Admitting: Oncology

## 2022-07-11 ENCOUNTER — Ambulatory Visit: Payer: BC Managed Care – PPO | Admitting: Oncology

## 2022-07-11 VITALS — BP 127/81 | HR 100 | Temp 98.1°F | Resp 18 | Ht 59.0 in | Wt 162.2 lb

## 2022-07-11 DIAGNOSIS — Z79899 Other long term (current) drug therapy: Secondary | ICD-10-CM | POA: Diagnosis not present

## 2022-07-11 DIAGNOSIS — D446 Neoplasm of uncertain behavior of carotid body: Secondary | ICD-10-CM | POA: Diagnosis not present

## 2022-07-11 DIAGNOSIS — D447 Neoplasm of uncertain behavior of aortic body and other paraganglia: Secondary | ICD-10-CM | POA: Diagnosis present

## 2022-07-11 DIAGNOSIS — D47Z2 Castleman disease: Secondary | ICD-10-CM

## 2022-07-11 DIAGNOSIS — Z86711 Personal history of pulmonary embolism: Secondary | ICD-10-CM | POA: Diagnosis not present

## 2022-07-11 NOTE — Progress Notes (Signed)
[place] OFFICE PROGRESS NOTE   Diagnosis: Castleman's disease, paraganglioma  INTERVAL HISTORY:   Tracy Downs returns as scheduled.  She feels well.  Good appetite and energy level.  No fever or night sweats.  No palpable lymph nodes.  She has occasional anterior chest discomfort.  No consistent pain.  No dyspnea.  She is working.  Objective:  Vital signs in last 24 hours:  Blood pressure 127/81, pulse 100, temperature 98.1 F (36.7 C), temperature source Oral, resp. rate 18, height 4\' 11"  (1.499 m), weight 162 lb 3.2 oz (73.6 kg), SpO2 100 %.    HEENT: Neck without mass, bilateral neck surgical sites without evidence of recurrent tumor Lymphatics: No cervical, supraclavicular, axillary, or inguinal nodes Resp: Lungs clear bilaterally Cardio: Regular rate and rhythm GI: Nontender, no hepatosplenomegaly, no mass Vascular: No leg edema  Lab Results:  Lab Results  Component Value Date   WBC 7.8 11/14/2018   HGB 16.7 (H) 03/10/2019   HCT 49.0 (H) 03/10/2019   MCV 89 11/14/2018   PLT 312 11/14/2018   NEUTROABS 4.7 11/14/2018    CMP  Lab Results  Component Value Date   NA 137 07/06/2022   K 4.7 07/06/2022   CL 102 07/06/2022   CO2 28 07/06/2022   GLUCOSE 97 07/06/2022   BUN 11 07/06/2022   CREATININE 0.91 07/06/2022   CALCIUM 9.5 07/06/2022   PROT 7.1 06/22/2019   ALBUMIN 4.1 06/22/2019   AST 22 06/22/2019   ALT 24 06/22/2019   ALKPHOS 56 06/22/2019   BILITOT 0.4 06/22/2019   GFRNONAA >60 07/06/2022   GFRAA >60 06/22/2019    No results found for: "CEA1", "CEA", "CAN199", "CA125"  Lab Results  Component Value Date   INR 0.96 11/25/2017   LABPROT 12.7 11/25/2017    Imaging:  No results found.  Medications: I have reviewed the patient's current medications.   Assessment/Plan: Castleman's disease. A CT of the chest 06/09/2009 confirmed a right paratracheal mass. A mediastinoscopy 06/21/2009 with biopsy of the mediastinal mass  confirmed Castleman's disease. Staging CTs of the chest, abdomen and pelvis 08/12/2009 confirmed a right paratracheal mass, a superior mediastinal lymph node and a left cervical/supraclavicular lymph node. There was no evidence of Castleman's disease in the abdomen or pelvis. A CT on 11/07/2009 was stable. Restaging CTs 07/10/2013 confirmed worsening of a single level 2 right cervical lymph node compared to a CT from 11/07/2009. No significant change in paratracheal lymphadenopathy. Chest x-ray 02/24/2014-stable right paratracheal soft tissue mass Chest x-ray 08/23/2014-stable right paratracheal soft tissue mass CT 11/29/2014 with a slight increase in the right cervical lymph node compared to 07/10/2013 and a stable right paratracheal node CT chest 07/11/2016 with interval stability of hypervascular right paratracheal lymphadenopathy. No new or progressive disease in the chest. CT neck 07/11/2016-enlarging heterogeneous hyperdense mass lesion within the right carotid space. Approximate 10 mm bilateral level II hyperdense lymph nodes stable. CT chest 05/29/2017-no change in the right paratracheal mass, no evidence of progressive disease CT angiogram chest 07/01/2017-no pulmonary embolism, right paratracheal mass-minimally larger, measures 3.5 x 4.1 compared to 3.1 x 3.8 on 05/29/2017 CT angiogram chest 03/12/2018- no pulmonary embolism, slight decrease in size of right paratracheal mass CT chest 06/19/2018- 2.9 cm right paratracheal nodal mass corresponding to patient's known Castleman's disease, mildly improved CT chest 06/22/2019-unchanged right paratracheal mass and superior mediastinal node CT chest 06/29/2020-unchanged right paratracheal mass and superior mediastinal node CT chest 07/03/2021-stable right paratracheal mass, no new lymphadenopathy CT chest 07/06/2022-stable right  paratracheal mass but no new lymphadenopathy G2 P2, status post delivery of her second child in January 2015 Bilateral  salpingectomy 06/05/2013 Right anterior cervical lymph node on exam 07/03/2013 CT neck 07/11/2016-enlarging heterogeneous hyperdense mass lesion within the right carotid space. Approximate 10 mm bilateral level II hyperdense lymph nodes stable. CT angio neck 08/31/2016-large 3.9 cm hypervascular mass within the right carotid space; small but very similar contralateral left carotid space hypervascular lesion measuring 1.2 cm.  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 Benign reactive cervical lymph node. 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 CT neck 07/06/2022-no evidence of recurrent carotid body tumor       Disposition: Tracy Downs appears stable.  There is no clinical or radiologic evidence for progression of the Castleman's disease.  No evidence of recurrent paraganglioma.  The intermittent chest discomfort may be related to the peritracheal mass.  She will call for consistent pain or new symptoms.  She will return for an office visit and surveillance imaging in 1 year.  Betsy Coder, MD  07/11/2022  12:45 PM

## 2022-08-21 ENCOUNTER — Ambulatory Visit
Admission: EM | Admit: 2022-08-21 | Discharge: 2022-08-21 | Disposition: A | Discharge status: Home / Self Care | Payer: BC Managed Care – PPO | Attending: Internal Medicine | Admitting: Internal Medicine

## 2022-08-21 DIAGNOSIS — M7751 Other enthesopathy of right foot: Secondary | ICD-10-CM | POA: Diagnosis not present

## 2022-08-21 MED ORDER — IBUPROFEN 800 MG PO TABS
800.0000 mg | ORAL_TABLET | Freq: Once | ORAL | Status: AC
  Administered 2022-08-21: 800 mg via ORAL

## 2022-08-21 NOTE — ED Triage Notes (Signed)
Pt reports she has right ankle pain and swelling x 1 week. Took tylenol which gave some relief.  R ankle hurts worse with walking.  Pt denies injury.

## 2022-08-21 NOTE — ED Provider Notes (Signed)
[name]    CSN: [id number] Arrival date & time: 08/21/22  1712      History   Chief Complaint No chief complaint on file.   HPI Tracy Downs is a 42 y.o. female.   Patient presents to urgent care for evaluation of ankle pain and swelling to the lateral aspect of the right ankle for the last approximately 1 week.  No recent trauma or injury to the right ankle.  She only experiences pain with weightbearing and is able to walk with a steady gait without limp.  No current pain with sitting.  No numbness or tingling distally to injury or previous injury to the right ankle/foot.  Works a sedentary job and does not spend long periods of time on her feet.  She has been taking Tylenol as needed and states this has helped a little bit.     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   Nonspecific (abnormal) findings on radiological and other examination of body structure 06/16/2009   COMPUTERIZED TOMOGRAPHY, CHEST, ABNORMAL 06/16/2009    Past Surgical History:  Procedure Laterality Date   ADENOIDECTOMY  1990s   BILATERAL SALPINGECTOMY Bilateral 2015   CAROTID ARTERY BODY TUMOR EMBOLIZATION  11/08/2016   CAROTID BODY TUMOR EXCISION Right 11/09/2016   Procedure: TUMOR EXCISION CAROTID BODY, right;  Surgeon: Nada Libman, MD;  Location: [facility] OR;  Service: Vascular;  Laterality: Right;   CAROTID BODY TUMOR EXCISION Left 12/05/2017   Procedure: TUMOR EXCISION CAROTID BODY LEFT;  Surgeon: Nada Libman, MD;  Location: [facility] OR;  Service: Vascular;  Laterality: Left;   castleman disease biopsy     IR ANGIO EXTERNAL CAROTID SEL  EXT CAROTID UNI L MOD SED  12/04/2017   IR ANGIO EXTERNAL CAROTID SEL EXT CAROTID UNI R MOD SED  11/08/2016   IR ANGIO INTRA EXTRACRAN SEL COM CAROTID INNOMINATE BILAT MOD SED  11/08/2016   IR ANGIO INTRA EXTRACRAN SEL COM CAROTID INNOMINATE BILAT MOD SED  12/04/2017   IR ANGIO VERTEBRAL SEL SUBCLAVIAN INNOMINATE UNI R MOD SED  11/08/2016   IR ANGIO VERTEBRAL SEL SUBCLAVIAN INNOMINATE UNI R MOD SED  12/04/2017   IR ANGIO VERTEBRAL SEL VERTEBRAL UNI L MOD SED  11/08/2016   IR ANGIOGRAM FOLLOW UP STUDY  11/08/2016   IR ANGIOGRAM SELECTIVE EACH ADDITIONAL VESSEL  11/08/2016   IR TRANSCATH/EMBOLIZ  11/08/2016   LAPAROSCOPIC BILATERAL SALPINGECTOMY Bilateral 06/05/2013   Procedure: LAPAROSCOPIC BILATERAL SALPINGECTOMY;  Surgeon: Antionette Char, MD;  Location: [facility];  Service: Gynecology;  Laterality: Bilateral;   LEEP     RADIOLOGY WITH ANESTHESIA Right 11/08/2016   Procedure: Carotid artery body tumor embolization;  Surgeon: Julieanne Cotton, MD;  Location: [facility] OR;  Service: Radiology;  Laterality: Right;   RADIOLOGY WITH ANESTHESIA N/A 12/04/2017   Procedure: EMBOLIZATION;  Surgeon: Julieanne Cotton, MD;  Location: [facility] OR;  Service: Radiology;  Laterality: N/A;    OB History     Gravida  2   Para  2   Term  2   Preterm      AB  Living  2      SAB      IAB      Ectopic      Multiple      Live Births  2            Home Medications    Prior to Admission medications   Medication Sig Start Date End Date Taking? Authorizing Provider  amLODipine (NORVASC) 5 MG tablet Take 5 mg by mouth daily. 05/18/20   [provider]  Multiple Vitamin (MULTIVITAMIN WITH MINERALS) TABS tablet Take 1 tablet by mouth daily.    [provider]  omeprazole (PRILOSEC) 40 MG capsule Take 40 mg by mouth daily. Patient not taking: Reported on 01/05/2022 05/18/20   [provider]    Family History Family History  Problem Relation Age of Onset   Cancer Mother         breast   Breast cancer Mother 51   Kidney disease Brother    Diabetes Maternal Grandmother    Hypertension Maternal Grandmother     Social History Social History   Tobacco Use   Smoking status: Never   Smokeless tobacco: Never  Vaping Use   Vaping Use: Never used  Substance Use Topics   Alcohol use: Yes    Comment: 11/08/2016 "might have a drink twice/month"   Drug use: No     Allergies   Amoxicillin, Hydrocodone, and Penicillins   Review of Systems Review of Systems Per HPI  Physical Exam Triage Vital Signs ED Triage Vitals  Enc Vitals Group     BP 08/21/22 1728 133/87     Pulse Rate 08/21/22 1728 99     Resp 08/21/22 1728 18     Temp 08/21/22 1728 98.4 F (36.9 C)     Temp Source 08/21/22 1728 Oral     SpO2 08/21/22 1728 96 %     Weight --      Height --      Head Circumference --      Peak Flow --      Pain Score 08/21/22 1729 4     Pain Loc --      Pain Edu? --      Excl. in GC? --    No data found.  Updated Vital Signs BP 133/87 (BP Location: Left Arm)   Pulse 99   Temp 98.4 F (36.9 C) (Oral)   Resp 18   LMP 08/14/2022 (Exact Date)   SpO2 96%   Visual Acuity Right Eye Distance:   Left Eye Distance:   Bilateral Distance:    Right Eye Near:   Left Eye Near:    Bilateral Near:     Physical Exam Vitals and nursing note reviewed.  Constitutional:      Appearance: She is not ill-appearing or toxic-appearing.  HENT:     Head: Normocephalic and atraumatic.     Right Ear: Hearing and external ear normal.     Left Ear: Hearing and external ear normal.     Nose: Nose normal.     Mouth/Throat:     Lips: Pink.  Eyes:     General: Lids are normal. Vision grossly intact. Gaze aligned appropriately.     Extraocular Movements: Extraocular movements intact.     Conjunctiva/sclera: Conjunctivae normal.  Pulmonary:     Effort: Pulmonary effort is normal.  Musculoskeletal:     Cervical back: Neck supple.     Right ankle: No swelling (No  observable swelling to the  right ankle), deformity, ecchymosis or lacerations. Tenderness (Slightly tender to palpation over the right lateral malleolus) present over the lateral malleolus. Normal range of motion (Able to perform full active range of motion without difficulty). Anterior drawer test negative. Normal pulse (+2 dorsalis pedis pulse with less than 2 capillary refill).     Right Achilles Tendon: Normal.     Left ankle: Normal.     Right foot: Normal.     Left foot: Normal.     Comments: No overlying rash, erythema, streaking, or laceration/abrasion to the right ankle.  Strength and sensation intact to bilateral lower extremities.  Skin:    General: Skin is warm and dry.     Capillary Refill: Capillary refill takes less than 2 seconds.     Findings: No rash.  Neurological:     General: No focal deficit present.     Mental Status: She is alert and oriented to person, place, and time. Mental status is at baseline.     Cranial Nerves: No dysarthria or facial asymmetry.  Psychiatric:        Mood and Affect: Mood normal.        Speech: Speech normal.        Behavior: Behavior normal.        Thought Content: Thought content normal.        Judgment: Judgment normal.      UC Treatments / Results  Labs (all labs ordered are listed, but only abnormal results are displayed) Labs Reviewed - No data to display  EKG   Radiology No results found.  Procedures Procedures (including critical care time)  Medications Ordered in UC Medications  ibuprofen (ADVIL) tablet 800 mg (800 mg Oral Given 08/21/22 1749)    Initial Impression / Assessment and Plan / UC Course  I have reviewed the triage vital signs and the nursing notes.  Pertinent labs & imaging results that were available during my care of the patient were reviewed by me and considered in my medical decision making (see chart for details).   1.  Tendinitis of right ankle Musculoskeletal exam is stable and I have low  suspicion for acute fracture/bony abnormality given exam findings and atraumatic mechanism of discomfort.  Ibuprofen 800 mg given in clinic for pain and inflammation.  May use ibuprofen 600 mg every 6 hours as needed at home for pain and swelling.  No clinical concern for acute gouty arthritis or septic joint etiology as there are no signs of infection.  Patient given walking referral to orthopedics to be used as needed for any new or worsening symptoms.  She is agreeable with plan.   Discussed physical exam and available lab work findings in clinic with patient.  Counseled patient regarding appropriate use of medications and potential side effects for all medications recommended or prescribed today. Discussed red flag signs and symptoms of worsening condition,when to call the PCP office, return to urgent care, and when to seek higher level of care in the emergency department. Patient verbalizes understanding and agreement with plan. All questions answered. Patient discharged in stable condition.    Final Clinical Impressions(s) / UC Diagnoses   Final diagnoses:  Tendonitis of ankle, right     Discharge Instructions      Your ankle exam looks great today. Rest, apply ice to your ankle 20 minutes on 20 minutes off as needed, and elevate your right ankle. I gave you some ibuprofen in the clinic. You may take ibuprofen 600 mg every 6  hours as needed for ankle pain and swelling.  Schedule an appointment with the orthopedic provider listed on your paperwork for follow-up as needed.  If you develop any new or worsening symptoms or do not improve in the next 2 to 3 days, please return.  If your symptoms are severe, please go to the emergency room.  Follow-up with your primary care provider for further evaluation and management of your symptoms as well as ongoing wellness visits.  I hope you feel better!    ED Prescriptions   None    PDMP not reviewed this encounter.   Carlisle Beers,  [place] 08/21/22 1755

## 2022-08-21 NOTE — Discharge Instructions (Signed)
Your ankle exam looks great today. Rest, apply ice to your ankle 20 minutes on 20 minutes off as needed, and elevate your right ankle. I gave you some ibuprofen in the clinic. You may take ibuprofen 600 mg every 6 hours as needed for ankle pain and swelling.  Schedule an appointment with the orthopedic provider listed on your paperwork for follow-up as needed.  If you develop any new or worsening symptoms or do not improve in the next 2 to 3 days, please return.  If your symptoms are severe, please go to the emergency room.  Follow-up with your primary care provider for further evaluation and management of your symptoms as well as ongoing wellness visits.  I hope you feel better!

## 2023-01-18 ENCOUNTER — Other Ambulatory Visit: Payer: Self-pay | Admitting: Obstetrics and Gynecology

## 2023-01-18 DIAGNOSIS — Z1231 Encounter for screening mammogram for malignant neoplasm of breast: Secondary | ICD-10-CM

## 2023-02-25 ENCOUNTER — Ambulatory Visit
Admission: RE | Admit: 2023-02-25 | Discharge: 2023-02-25 | Disposition: A | Discharge status: Home / Self Care | Payer: BC Managed Care – PPO | Source: Ambulatory Visit | Attending: Obstetrics and Gynecology | Admitting: Obstetrics and Gynecology

## 2023-02-25 DIAGNOSIS — Z1231 Encounter for screening mammogram for malignant neoplasm of breast: Secondary | ICD-10-CM

## 2023-05-21 ENCOUNTER — Telehealth: Payer: Self-pay | Admitting: *Deleted

## 2023-05-21 NOTE — Telephone Encounter (Signed)
Called patient with her CT appointment for 07/11/23 at [facility]. Arrive at 0845 for 0900 scan. Liquids only 4 hours prior.

## 2023-07-11 ENCOUNTER — Ambulatory Visit (HOSPITAL_BASED_OUTPATIENT_CLINIC_OR_DEPARTMENT_OTHER)
Admission: RE | Admit: 2023-07-11 | Discharge: 2023-07-11 | Disposition: A | Discharge status: Home / Self Care | Payer: BC Managed Care – PPO | Source: Ambulatory Visit | Attending: Oncology | Admitting: Oncology

## 2023-07-11 ENCOUNTER — Inpatient Hospital Stay: Payer: Self-pay | Attending: Oncology

## 2023-07-11 ENCOUNTER — Other Ambulatory Visit: Payer: BC Managed Care – PPO

## 2023-07-11 DIAGNOSIS — Z79899 Other long term (current) drug therapy: Secondary | ICD-10-CM | POA: Diagnosis not present

## 2023-07-11 DIAGNOSIS — D47Z2 Castleman disease: Secondary | ICD-10-CM | POA: Insufficient documentation

## 2023-07-11 LAB — BASIC METABOLIC PANEL - CANCER CENTER ONLY
Anion gap: 7 (ref 5–15)
BUN: 14 mg/dL (ref 6–20)
CO2: 29 mmol/L (ref 22–32)
Calcium: 9.2 mg/dL (ref 8.9–10.3)
Chloride: 101 mmol/L (ref 98–111)
Creatinine: 0.84 mg/dL (ref 0.44–1.00)
GFR, Estimated: >60 mL/min (ref >60)
Glucose, Bld: 89 mg/dL (ref 70–99)
Potassium: 4.6 mmol/L (ref 3.5–5.1)
Sodium: 137 mmol/L (ref 135–145)

## 2023-07-11 MED ORDER — IOHEXOL 300 MG/ML  SOLN
100.0000 mL | Freq: Once | INTRAMUSCULAR | Status: AC | PRN
  Administered 2023-07-11: 75 mL via INTRAVENOUS

## 2023-07-18 ENCOUNTER — Inpatient Hospital Stay: Payer: BC Managed Care – PPO | Attending: Oncology | Admitting: Oncology

## 2023-07-18 ENCOUNTER — Telehealth: Payer: Self-pay | Admitting: Oncology

## 2023-07-18 VITALS — BP 107/87 | HR 88 | Temp 98.2°F | Resp 18 | Ht 59.0 in | Wt 161.7 lb

## 2023-07-18 DIAGNOSIS — D47Z2 Castleman disease: Secondary | ICD-10-CM | POA: Diagnosis present

## 2023-07-18 DIAGNOSIS — Z79899 Other long term (current) drug therapy: Secondary | ICD-10-CM | POA: Diagnosis not present

## 2023-07-18 DIAGNOSIS — D447 Neoplasm of uncertain behavior of aortic body and other paraganglia: Secondary | ICD-10-CM | POA: Diagnosis not present

## 2023-07-18 NOTE — Telephone Encounter (Signed)
 Patient has been scheduled for follow-up visit per 07/18/23 LOS.  Pt aware of appt details

## 2023-07-18 NOTE — Progress Notes (Signed)
 [facility] OFFICE PROGRESS NOTE   Diagnosis: Castleman's disease, paraganglioma  INTERVAL HISTORY:   [name] returns as scheduled.  She feels well.  No fever, sweats, tachycardia, or headaches.  No new complaint.  Objective:  Vital signs in last 24 hours:  Blood pressure 107/87, pulse 88, temperature 98.2 F (36.8 C), temperature source Temporal, resp. rate 18, height 4\' 11"  (1.499 m), weight 161 lb 11.2 oz (73.3 kg), SpO2 100%.    HEENT: Neck without mass Lymphatics: No cervical, supraclavicular, axillary, or inguinal nodes Resp: Lungs clear bilaterally Cardio: Regular rate and rhythm GI: No hepatosplenomegaly Vascular: No leg edema   Lab Results:  Lab Results  Component Value Date   WBC 7.8 11/14/2018   HGB 16.7 (H) 03/10/2019   HCT 49.0 (H) 03/10/2019   MCV 89 11/14/2018   PLT 312 11/14/2018   NEUTROABS 4.7 11/14/2018    CMP  Lab Results  Component Value Date   NA 137 07/11/2023   K 4.6 07/11/2023   CL 101 07/11/2023   CO2 29 07/11/2023   GLUCOSE 89 07/11/2023   BUN 14 07/11/2023   CREATININE 0.84 07/11/2023   CALCIUM 9.2 07/11/2023   PROT 7.1 06/22/2019   ALBUMIN 4.1 06/22/2019   AST 22 06/22/2019   ALT 24 06/22/2019   ALKPHOS 56 06/22/2019   BILITOT 0.4 06/22/2019   GFRNONAA >60 07/11/2023   GFRAA >60 06/22/2019    No results found for: "CEA1", "CEA", "CAN199", "CA125"  Lab Results  Component Value Date   INR 0.96 11/25/2017   LABPROT 12.7 11/25/2017    Imaging:  No results found.  Medications: I have reviewed the patient's current medications.   Assessment/Plan: Castleman's disease. A CT of the chest 06/09/2009 confirmed a right paratracheal mass. A mediastinoscopy 06/21/2009 with biopsy of the mediastinal mass confirmed Castleman's disease. Staging CTs of the chest, abdomen and pelvis 08/12/2009 confirmed a right paratracheal mass, a superior mediastinal lymph node and a left cervical/supraclavicular lymph node.  There was no evidence of Castleman's disease in the abdomen or pelvis. A CT on 11/07/2009 was stable. Restaging CTs 07/10/2013 confirmed worsening of a single level 2 right cervical lymph node compared to a CT from 11/07/2009. No significant change in paratracheal lymphadenopathy. Chest x-ray 02/24/2014-stable right paratracheal soft tissue mass Chest x-ray 08/23/2014-stable right paratracheal soft tissue mass CT 11/29/2014 with a slight increase in the right cervical lymph node compared to 07/10/2013 and a stable right paratracheal node CT chest 07/11/2016 with interval stability of hypervascular right paratracheal lymphadenopathy. No new or progressive disease in the chest. CT neck 07/11/2016-enlarging heterogeneous hyperdense mass lesion within the right carotid space. Approximate 10 mm bilateral level II hyperdense lymph nodes stable. CT chest 05/29/2017-no change in the right paratracheal mass, no evidence of progressive disease CT angiogram chest 07/01/2017-no pulmonary embolism, right paratracheal mass-minimally larger, measures 3.5 x 4.1 compared to 3.1 x 3.8 on 05/29/2017 CT angiogram chest 03/12/2018- no pulmonary embolism, slight decrease in size of right paratracheal mass CT chest 06/19/2018- 2.9 cm right paratracheal nodal mass corresponding to patient's known Castleman's disease, mildly improved CT chest 06/22/2019-unchanged right paratracheal mass and superior mediastinal node CT chest 06/29/2020-unchanged right paratracheal mass and superior mediastinal node CT chest 07/03/2021-stable right paratracheal mass, no new lymphadenopathy CT chest 07/06/2022-stable right paratracheal mass but no new lymphadenopathy CT chest 07/11/2023-slight decrease in right paratracheal lymph node, no new lymphadenopathy G2 P2, status post delivery of her second child in January 2015 Bilateral salpingectomy 06/05/2013 Right anterior cervical  lymph node on exam 07/03/2013 CT neck 07/11/2016-enlarging heterogeneous  hyperdense mass lesion within the right carotid space. Approximate 10 mm bilateral level II hyperdense lymph nodes stable. CT angio neck 08/31/2016-large 3.9 cm hypervascular mass within the right carotid space; small but very similar contralateral left carotid space hypervascular lesion measuring 1.2 cm.  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 Benign reactive cervical lymph node. 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 CT neck 07/06/2022-no evidence of recurrent carotid body tumor CT neck 07/11/2023-no evidence of recurrent carotid body tumor         Disposition: [name] appears stable.  There is no clinical evidence for progression of the Castleman's disease or recurrence of a carotid body tumor.  She will return for an office visit in 1 year.  We will plan for repeat imaging of the neck and chest in 2 years.  She will call for new symptoms.  Thornton Papas, MD  07/18/2023  8:10 AM

## 2024-01-13 ENCOUNTER — Telehealth: Payer: Self-pay

## 2024-01-13 NOTE — Telephone Encounter (Signed)
 The patient's appointment has been rescheduled in accordance with the provider's updated office hours. A reminder letter has been sent to inform the patient of the new appointment date.

## 2024-01-27 ENCOUNTER — Other Ambulatory Visit: Payer: Self-pay | Admitting: Registered Nurse

## 2024-01-27 DIAGNOSIS — Z1231 Encounter for screening mammogram for malignant neoplasm of breast: Secondary | ICD-10-CM

## 2024-02-27 ENCOUNTER — Ambulatory Visit
Admission: RE | Admit: 2024-02-27 | Discharge: 2024-02-27 | Disposition: A | Source: Ambulatory Visit | Attending: Registered Nurse | Admitting: Registered Nurse

## 2024-02-27 DIAGNOSIS — Z1231 Encounter for screening mammogram for malignant neoplasm of breast: Secondary | ICD-10-CM

## 2024-07-16 ENCOUNTER — Ambulatory Visit: Admitting: Oncology

## 2024-07-17 ENCOUNTER — Ambulatory Visit: Admitting: Oncology
